# Patient Record
Sex: Female | Born: 1935 | Race: White | Hispanic: No | Marital: Married | Smoking: Never smoker
Health system: Southern US, Community
[De-identification: ages and names within clinical notes are randomized; demographics above are authoritative.]

## PROBLEM LIST (undated history)

## (undated) DIAGNOSIS — K589 Irritable bowel syndrome without diarrhea: Secondary | ICD-10-CM

## (undated) DIAGNOSIS — K222 Esophageal obstruction: Secondary | ICD-10-CM

## (undated) DIAGNOSIS — E538 Deficiency of other specified B group vitamins: Secondary | ICD-10-CM

## (undated) DIAGNOSIS — E079 Disorder of thyroid, unspecified: Secondary | ICD-10-CM

## (undated) DIAGNOSIS — R269 Unspecified abnormalities of gait and mobility: Principal | ICD-10-CM

## (undated) DIAGNOSIS — D509 Iron deficiency anemia, unspecified: Secondary | ICD-10-CM

## (undated) DIAGNOSIS — D649 Anemia, unspecified: Secondary | ICD-10-CM

## (undated) DIAGNOSIS — K219 Gastro-esophageal reflux disease without esophagitis: Secondary | ICD-10-CM

## (undated) DIAGNOSIS — N200 Calculus of kidney: Secondary | ICD-10-CM

## (undated) DIAGNOSIS — I10 Essential (primary) hypertension: Secondary | ICD-10-CM

## (undated) HISTORY — DX: Irritable bowel syndrome, unspecified: K58.9

## (undated) HISTORY — DX: Esophageal obstruction: K22.2

## (undated) HISTORY — DX: Unspecified abnormalities of gait and mobility: R26.9

## (undated) HISTORY — DX: Essential (primary) hypertension: I10

## (undated) HISTORY — PX: COLONOSCOPY: SHX174

## (undated) HISTORY — DX: Iron deficiency anemia, unspecified: D50.9

## (undated) HISTORY — DX: Calculus of kidney: N20.0

## (undated) HISTORY — DX: Gastro-esophageal reflux disease without esophagitis: K21.9

## (undated) HISTORY — DX: Disorder of thyroid, unspecified: E07.9

## (undated) HISTORY — PX: ESOPHAGEAL DILATION: SHX303

## (undated) HISTORY — DX: Deficiency of other specified B group vitamins: E53.8

## (undated) HISTORY — DX: Anemia, unspecified: D64.9

---
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
## 1969-02-03 HISTORY — PX: VAGOTOMY AND PYLOROPLASTY: SHX831

## 1973-02-03 HISTORY — PX: ABDOMINAL HYSTERECTOMY: SHX81

## 1976-02-04 HISTORY — PX: CHOLECYSTECTOMY: SHX55

## 1997-06-16 ENCOUNTER — Other Ambulatory Visit: Admission: RE | Admit: 1997-06-16 | Discharge: 1997-06-16 | Payer: Self-pay | Admitting: *Deleted

## 1997-07-28 ENCOUNTER — Inpatient Hospital Stay (HOSPITAL_COMMUNITY): Admission: RE | Admit: 1997-07-28 | Discharge: 1997-07-30 | Payer: Self-pay | Admitting: Internal Medicine

## 1997-12-13 ENCOUNTER — Encounter: Payer: Self-pay | Admitting: Gastroenterology

## 1997-12-13 ENCOUNTER — Ambulatory Visit (HOSPITAL_COMMUNITY): Admission: RE | Admit: 1997-12-13 | Discharge: 1997-12-13 | Payer: Self-pay | Admitting: Gastroenterology

## 1998-05-04 ENCOUNTER — Ambulatory Visit (HOSPITAL_COMMUNITY): Admission: RE | Admit: 1998-05-04 | Discharge: 1998-05-04 | Payer: Self-pay | Admitting: Gastroenterology

## 1998-05-04 ENCOUNTER — Encounter: Payer: Self-pay | Admitting: Gastroenterology

## 1998-05-08 ENCOUNTER — Encounter (HOSPITAL_COMMUNITY): Admission: RE | Admit: 1998-05-08 | Discharge: 1998-08-06 | Payer: Self-pay | Admitting: Internal Medicine

## 1998-05-24 ENCOUNTER — Other Ambulatory Visit: Admission: RE | Admit: 1998-05-24 | Discharge: 1998-05-24 | Payer: Self-pay | Admitting: Gastroenterology

## 1998-06-19 ENCOUNTER — Other Ambulatory Visit: Admission: RE | Admit: 1998-06-19 | Discharge: 1998-06-19 | Payer: Self-pay | Admitting: *Deleted

## 1998-12-31 ENCOUNTER — Encounter (HOSPITAL_COMMUNITY): Admission: RE | Admit: 1998-12-31 | Discharge: 1999-03-31 | Payer: Self-pay | Admitting: Internal Medicine

## 1999-03-12 ENCOUNTER — Other Ambulatory Visit: Admission: RE | Admit: 1999-03-12 | Discharge: 1999-03-12 | Payer: Self-pay | Admitting: *Deleted

## 1999-06-13 ENCOUNTER — Other Ambulatory Visit: Admission: RE | Admit: 1999-06-13 | Discharge: 1999-06-13 | Payer: Self-pay | Admitting: *Deleted

## 1999-12-06 ENCOUNTER — Encounter (INDEPENDENT_AMBULATORY_CARE_PROVIDER_SITE_OTHER): Payer: Self-pay | Admitting: Specialist

## 1999-12-06 ENCOUNTER — Other Ambulatory Visit: Admission: RE | Admit: 1999-12-06 | Discharge: 1999-12-06 | Payer: Self-pay | Admitting: Gastroenterology

## 2000-04-27 ENCOUNTER — Other Ambulatory Visit: Admission: RE | Admit: 2000-04-27 | Discharge: 2000-04-27 | Payer: Self-pay | Admitting: Gastroenterology

## 2000-04-27 ENCOUNTER — Encounter (INDEPENDENT_AMBULATORY_CARE_PROVIDER_SITE_OTHER): Payer: Self-pay

## 2000-04-28 ENCOUNTER — Emergency Department (HOSPITAL_COMMUNITY): Admission: EM | Admit: 2000-04-28 | Discharge: 2000-04-28 | Payer: Self-pay | Admitting: *Deleted

## 2000-04-29 ENCOUNTER — Encounter: Payer: Self-pay | Admitting: Gastroenterology

## 2000-04-29 ENCOUNTER — Inpatient Hospital Stay (HOSPITAL_COMMUNITY): Admission: EM | Admit: 2000-04-29 | Discharge: 2000-05-01 | Payer: Self-pay | Admitting: Gastroenterology

## 2000-05-12 ENCOUNTER — Encounter: Admission: RE | Admit: 2000-05-12 | Discharge: 2000-05-12 | Payer: Self-pay | Admitting: Oncology

## 2000-05-12 ENCOUNTER — Encounter (HOSPITAL_COMMUNITY): Admission: RE | Admit: 2000-05-12 | Discharge: 2000-06-11 | Payer: Self-pay | Admitting: Oncology

## 2000-06-17 ENCOUNTER — Encounter: Admission: RE | Admit: 2000-06-17 | Discharge: 2000-06-17 | Payer: Self-pay | Admitting: Oncology

## 2000-06-22 ENCOUNTER — Other Ambulatory Visit: Admission: RE | Admit: 2000-06-22 | Discharge: 2000-06-22 | Payer: Self-pay | Admitting: *Deleted

## 2000-09-09 ENCOUNTER — Encounter: Admission: RE | Admit: 2000-09-09 | Discharge: 2000-09-09 | Payer: Self-pay | Admitting: Oncology

## 2000-09-09 ENCOUNTER — Encounter (HOSPITAL_COMMUNITY): Admission: RE | Admit: 2000-09-09 | Discharge: 2000-10-09 | Payer: Self-pay | Admitting: Oncology

## 2000-10-09 ENCOUNTER — Encounter: Admission: RE | Admit: 2000-10-09 | Discharge: 2000-10-09 | Payer: Self-pay | Admitting: Oncology

## 2000-12-04 ENCOUNTER — Encounter: Admission: RE | Admit: 2000-12-04 | Discharge: 2000-12-04 | Payer: Self-pay | Admitting: Oncology

## 2000-12-04 ENCOUNTER — Encounter (HOSPITAL_COMMUNITY): Admission: RE | Admit: 2000-12-04 | Discharge: 2001-01-03 | Payer: Self-pay | Admitting: Oncology

## 2001-01-11 ENCOUNTER — Encounter: Admission: RE | Admit: 2001-01-11 | Discharge: 2001-01-11 | Payer: Self-pay | Admitting: Oncology

## 2001-01-11 ENCOUNTER — Encounter (HOSPITAL_COMMUNITY): Admission: RE | Admit: 2001-01-11 | Discharge: 2001-02-10 | Payer: Self-pay | Admitting: Oncology

## 2001-02-15 ENCOUNTER — Encounter (HOSPITAL_COMMUNITY): Admission: RE | Admit: 2001-02-15 | Discharge: 2001-03-17 | Payer: Self-pay | Admitting: Oncology

## 2001-02-15 ENCOUNTER — Encounter: Admission: RE | Admit: 2001-02-15 | Discharge: 2001-02-15 | Payer: Self-pay | Admitting: Oncology

## 2001-05-14 ENCOUNTER — Encounter (HOSPITAL_COMMUNITY): Admission: RE | Admit: 2001-05-14 | Discharge: 2001-06-13 | Payer: Self-pay | Admitting: Oncology

## 2001-05-14 ENCOUNTER — Encounter: Admission: RE | Admit: 2001-05-14 | Discharge: 2001-05-14 | Payer: Self-pay | Admitting: Oncology

## 2001-07-12 ENCOUNTER — Encounter: Admission: RE | Admit: 2001-07-12 | Discharge: 2001-07-12 | Payer: Self-pay | Admitting: Oncology

## 2001-07-12 ENCOUNTER — Encounter (HOSPITAL_COMMUNITY): Admission: RE | Admit: 2001-07-12 | Discharge: 2001-08-11 | Payer: Self-pay | Admitting: Oncology

## 2001-08-26 ENCOUNTER — Encounter (HOSPITAL_COMMUNITY): Admission: RE | Admit: 2001-08-26 | Discharge: 2001-09-25 | Payer: Self-pay | Admitting: Oncology

## 2001-08-26 ENCOUNTER — Encounter: Payer: Self-pay | Admitting: Internal Medicine

## 2001-08-26 ENCOUNTER — Encounter: Admission: RE | Admit: 2001-08-26 | Discharge: 2001-08-26 | Payer: Self-pay | Admitting: Oncology

## 2001-08-26 ENCOUNTER — Encounter: Admission: RE | Admit: 2001-08-26 | Discharge: 2001-08-26 | Payer: Self-pay | Admitting: Internal Medicine

## 2001-08-27 ENCOUNTER — Encounter: Payer: Self-pay | Admitting: Cardiology

## 2001-08-27 ENCOUNTER — Ambulatory Visit: Admission: RE | Admit: 2001-08-27 | Discharge: 2001-08-27 | Payer: Self-pay | Admitting: Internal Medicine

## 2001-10-11 ENCOUNTER — Encounter (HOSPITAL_COMMUNITY): Admission: RE | Admit: 2001-10-11 | Discharge: 2001-11-10 | Payer: Self-pay | Admitting: Oncology

## 2001-10-11 ENCOUNTER — Encounter: Admission: RE | Admit: 2001-10-11 | Discharge: 2001-10-11 | Payer: Self-pay | Admitting: Oncology

## 2001-11-22 ENCOUNTER — Encounter: Admission: RE | Admit: 2001-11-22 | Discharge: 2001-11-22 | Payer: Self-pay | Admitting: Oncology

## 2001-11-22 ENCOUNTER — Encounter (HOSPITAL_COMMUNITY): Admission: RE | Admit: 2001-11-22 | Discharge: 2001-12-22 | Payer: Self-pay | Admitting: Oncology

## 2001-12-01 ENCOUNTER — Ambulatory Visit (HOSPITAL_COMMUNITY): Admission: RE | Admit: 2001-12-01 | Discharge: 2001-12-01 | Payer: Self-pay | Admitting: Internal Medicine

## 2001-12-06 ENCOUNTER — Encounter: Payer: Self-pay | Admitting: Internal Medicine

## 2001-12-06 ENCOUNTER — Encounter: Admission: RE | Admit: 2001-12-06 | Discharge: 2001-12-06 | Payer: Self-pay | Admitting: Internal Medicine

## 2002-02-07 ENCOUNTER — Encounter: Payer: Self-pay | Admitting: Emergency Medicine

## 2002-02-07 ENCOUNTER — Emergency Department (HOSPITAL_COMMUNITY): Admission: EM | Admit: 2002-02-07 | Discharge: 2002-02-07 | Payer: Self-pay | Admitting: Emergency Medicine

## 2002-02-13 ENCOUNTER — Inpatient Hospital Stay (HOSPITAL_COMMUNITY): Admission: RE | Admit: 2002-02-13 | Discharge: 2002-02-16 | Payer: Self-pay | Admitting: Internal Medicine

## 2002-02-13 ENCOUNTER — Encounter: Payer: Self-pay | Admitting: Internal Medicine

## 2002-02-21 ENCOUNTER — Encounter: Admission: RE | Admit: 2002-02-21 | Discharge: 2002-02-21 | Payer: Self-pay | Admitting: Oncology

## 2002-02-21 ENCOUNTER — Encounter (HOSPITAL_COMMUNITY): Admission: RE | Admit: 2002-02-21 | Discharge: 2002-03-23 | Payer: Self-pay | Admitting: Oncology

## 2002-04-18 ENCOUNTER — Encounter: Admission: RE | Admit: 2002-04-18 | Discharge: 2002-04-18 | Payer: Self-pay | Admitting: Oncology

## 2002-04-18 ENCOUNTER — Encounter (HOSPITAL_COMMUNITY): Admission: RE | Admit: 2002-04-18 | Discharge: 2002-05-18 | Payer: Self-pay | Admitting: Oncology

## 2002-06-14 ENCOUNTER — Encounter: Admission: RE | Admit: 2002-06-14 | Discharge: 2002-06-14 | Payer: Self-pay | Admitting: Oncology

## 2002-06-14 ENCOUNTER — Encounter (HOSPITAL_COMMUNITY): Admission: RE | Admit: 2002-06-14 | Discharge: 2002-07-14 | Payer: Self-pay | Admitting: Oncology

## 2002-07-28 ENCOUNTER — Encounter: Admission: RE | Admit: 2002-07-28 | Discharge: 2002-07-28 | Payer: Self-pay | Admitting: Oncology

## 2002-07-28 ENCOUNTER — Encounter (HOSPITAL_COMMUNITY): Admission: RE | Admit: 2002-07-28 | Discharge: 2002-08-27 | Payer: Self-pay | Admitting: Oncology

## 2002-09-07 ENCOUNTER — Encounter: Admission: RE | Admit: 2002-09-07 | Discharge: 2002-09-07 | Payer: Self-pay | Admitting: Oncology

## 2002-10-13 ENCOUNTER — Encounter (HOSPITAL_COMMUNITY): Admission: RE | Admit: 2002-10-13 | Discharge: 2002-11-12 | Payer: Self-pay | Admitting: Oncology

## 2002-10-13 ENCOUNTER — Encounter: Admission: RE | Admit: 2002-10-13 | Discharge: 2002-10-13 | Payer: Self-pay | Admitting: Oncology

## 2002-11-17 ENCOUNTER — Encounter: Admission: RE | Admit: 2002-11-17 | Discharge: 2002-11-17 | Payer: Self-pay | Admitting: Oncology

## 2002-12-20 ENCOUNTER — Encounter: Admission: RE | Admit: 2002-12-20 | Discharge: 2002-12-20 | Payer: Self-pay | Admitting: Oncology

## 2002-12-20 ENCOUNTER — Encounter (HOSPITAL_COMMUNITY): Admission: RE | Admit: 2002-12-20 | Discharge: 2003-01-19 | Payer: Self-pay | Admitting: Oncology

## 2003-01-31 ENCOUNTER — Encounter (HOSPITAL_COMMUNITY): Admission: RE | Admit: 2003-01-31 | Discharge: 2003-03-02 | Payer: Self-pay | Admitting: Oncology

## 2003-01-31 ENCOUNTER — Encounter: Admission: RE | Admit: 2003-01-31 | Discharge: 2003-01-31 | Payer: Self-pay | Admitting: Oncology

## 2003-03-22 ENCOUNTER — Encounter: Admission: RE | Admit: 2003-03-22 | Discharge: 2003-03-22 | Payer: Self-pay | Admitting: Oncology

## 2003-04-24 ENCOUNTER — Encounter (HOSPITAL_COMMUNITY): Admission: RE | Admit: 2003-04-24 | Discharge: 2003-05-24 | Payer: Self-pay | Admitting: Oncology

## 2003-04-24 ENCOUNTER — Encounter: Admission: RE | Admit: 2003-04-24 | Discharge: 2003-04-24 | Payer: Self-pay | Admitting: Oncology

## 2003-06-26 ENCOUNTER — Encounter: Admission: RE | Admit: 2003-06-26 | Discharge: 2003-06-26 | Payer: Self-pay

## 2003-06-26 ENCOUNTER — Encounter (HOSPITAL_COMMUNITY): Admission: RE | Admit: 2003-06-26 | Discharge: 2003-07-26 | Payer: Self-pay | Admitting: Oncology

## 2003-07-31 ENCOUNTER — Encounter: Admission: RE | Admit: 2003-07-31 | Discharge: 2003-07-31 | Payer: Self-pay | Admitting: Internal Medicine

## 2003-08-02 ENCOUNTER — Encounter: Admission: RE | Admit: 2003-08-02 | Discharge: 2003-08-02 | Payer: Self-pay | Admitting: Internal Medicine

## 2003-08-14 ENCOUNTER — Encounter (HOSPITAL_COMMUNITY): Admission: RE | Admit: 2003-08-14 | Discharge: 2003-09-13 | Payer: Self-pay | Admitting: Oncology

## 2003-08-14 ENCOUNTER — Encounter: Admission: RE | Admit: 2003-08-14 | Discharge: 2003-08-14 | Payer: Self-pay | Admitting: Oncology

## 2003-08-23 ENCOUNTER — Encounter: Admission: RE | Admit: 2003-08-23 | Discharge: 2003-08-23 | Payer: Self-pay | Admitting: Internal Medicine

## 2003-09-20 ENCOUNTER — Encounter: Admission: RE | Admit: 2003-09-20 | Discharge: 2003-09-20 | Payer: Self-pay | Admitting: Internal Medicine

## 2003-10-16 ENCOUNTER — Encounter: Admission: RE | Admit: 2003-10-16 | Discharge: 2003-11-03 | Payer: Self-pay | Admitting: Oncology

## 2003-10-16 ENCOUNTER — Encounter (HOSPITAL_COMMUNITY): Admission: RE | Admit: 2003-10-16 | Discharge: 2003-11-03 | Payer: Self-pay | Admitting: Oncology

## 2003-11-27 ENCOUNTER — Encounter: Admission: RE | Admit: 2003-11-27 | Discharge: 2003-11-27 | Payer: Self-pay | Admitting: Internal Medicine

## 2004-01-08 ENCOUNTER — Encounter (HOSPITAL_COMMUNITY): Admission: RE | Admit: 2004-01-08 | Discharge: 2004-02-02 | Payer: Self-pay | Admitting: Oncology

## 2004-01-08 ENCOUNTER — Ambulatory Visit (HOSPITAL_COMMUNITY): Payer: Self-pay | Admitting: Oncology

## 2004-01-08 ENCOUNTER — Encounter: Admission: RE | Admit: 2004-01-08 | Discharge: 2004-02-02 | Payer: Self-pay | Admitting: Oncology

## 2004-05-13 ENCOUNTER — Encounter: Admission: RE | Admit: 2004-05-13 | Discharge: 2004-05-13 | Payer: Self-pay | Admitting: Oncology

## 2004-05-13 ENCOUNTER — Ambulatory Visit (HOSPITAL_COMMUNITY): Payer: Self-pay | Admitting: Oncology

## 2004-05-13 ENCOUNTER — Encounter (HOSPITAL_COMMUNITY): Admission: RE | Admit: 2004-05-13 | Discharge: 2004-06-12 | Payer: Self-pay | Admitting: Oncology

## 2004-07-29 ENCOUNTER — Encounter (HOSPITAL_COMMUNITY): Admission: RE | Admit: 2004-07-29 | Discharge: 2004-08-28 | Payer: Self-pay | Admitting: Oncology

## 2004-07-29 ENCOUNTER — Encounter: Admission: RE | Admit: 2004-07-29 | Discharge: 2004-07-29 | Payer: Self-pay | Admitting: Oncology

## 2004-07-31 ENCOUNTER — Ambulatory Visit (HOSPITAL_COMMUNITY): Payer: Self-pay | Admitting: Oncology

## 2004-10-21 ENCOUNTER — Ambulatory Visit (HOSPITAL_COMMUNITY): Payer: Self-pay | Admitting: Oncology

## 2004-10-21 ENCOUNTER — Encounter (HOSPITAL_COMMUNITY): Admission: RE | Admit: 2004-10-21 | Discharge: 2004-11-01 | Payer: Self-pay | Admitting: Oncology

## 2004-10-21 ENCOUNTER — Encounter: Admission: RE | Admit: 2004-10-21 | Discharge: 2004-11-01 | Payer: Self-pay | Admitting: Oncology

## 2005-01-15 ENCOUNTER — Ambulatory Visit (HOSPITAL_COMMUNITY): Payer: Self-pay | Admitting: Oncology

## 2005-01-15 ENCOUNTER — Encounter: Admission: RE | Admit: 2005-01-15 | Discharge: 2005-01-15 | Payer: Self-pay | Admitting: Oncology

## 2005-04-11 ENCOUNTER — Encounter: Admission: RE | Admit: 2005-04-11 | Discharge: 2005-04-11 | Payer: Self-pay | Admitting: Oncology

## 2005-04-11 ENCOUNTER — Encounter (HOSPITAL_COMMUNITY): Admission: RE | Admit: 2005-04-11 | Discharge: 2005-05-11 | Payer: Self-pay | Admitting: Oncology

## 2005-04-11 ENCOUNTER — Ambulatory Visit (HOSPITAL_COMMUNITY): Payer: Self-pay | Admitting: Oncology

## 2005-07-03 ENCOUNTER — Ambulatory Visit (HOSPITAL_COMMUNITY): Payer: Self-pay | Admitting: Oncology

## 2005-07-03 ENCOUNTER — Encounter: Admission: RE | Admit: 2005-07-03 | Discharge: 2005-07-03 | Payer: Self-pay | Admitting: Oncology

## 2005-07-03 ENCOUNTER — Encounter (HOSPITAL_COMMUNITY): Admission: RE | Admit: 2005-07-03 | Discharge: 2005-08-02 | Payer: Self-pay | Admitting: Oncology

## 2005-09-25 ENCOUNTER — Encounter (HOSPITAL_COMMUNITY): Admission: RE | Admit: 2005-09-25 | Discharge: 2005-10-25 | Payer: Self-pay | Admitting: Oncology

## 2005-09-25 ENCOUNTER — Encounter: Admission: RE | Admit: 2005-09-25 | Discharge: 2005-09-25 | Payer: Self-pay | Admitting: Oncology

## 2005-09-29 ENCOUNTER — Ambulatory Visit (HOSPITAL_COMMUNITY): Payer: Self-pay | Admitting: Oncology

## 2005-12-29 ENCOUNTER — Ambulatory Visit (HOSPITAL_COMMUNITY): Payer: Self-pay | Admitting: Oncology

## 2006-03-26 ENCOUNTER — Ambulatory Visit (HOSPITAL_COMMUNITY): Payer: Self-pay | Admitting: Oncology

## 2006-03-26 ENCOUNTER — Encounter (HOSPITAL_COMMUNITY): Admission: RE | Admit: 2006-03-26 | Discharge: 2006-04-25 | Payer: Self-pay | Admitting: Oncology

## 2006-06-18 ENCOUNTER — Ambulatory Visit (HOSPITAL_COMMUNITY): Payer: Self-pay | Admitting: Oncology

## 2006-06-18 ENCOUNTER — Encounter (HOSPITAL_COMMUNITY): Admission: RE | Admit: 2006-06-18 | Discharge: 2006-07-18 | Payer: Self-pay | Admitting: Oncology

## 2006-09-17 ENCOUNTER — Ambulatory Visit (HOSPITAL_COMMUNITY): Payer: Self-pay | Admitting: Oncology

## 2006-09-17 ENCOUNTER — Encounter (HOSPITAL_COMMUNITY): Admission: RE | Admit: 2006-09-17 | Discharge: 2006-10-17 | Payer: Self-pay | Admitting: Oncology

## 2006-11-04 ENCOUNTER — Encounter: Admission: RE | Admit: 2006-11-04 | Discharge: 2006-11-04 | Payer: Self-pay | Admitting: Internal Medicine

## 2006-12-10 ENCOUNTER — Ambulatory Visit (HOSPITAL_COMMUNITY): Payer: Self-pay | Admitting: Oncology

## 2006-12-10 ENCOUNTER — Encounter (HOSPITAL_COMMUNITY): Admission: RE | Admit: 2006-12-10 | Discharge: 2007-01-09 | Payer: Self-pay | Admitting: Oncology

## 2006-12-10 ENCOUNTER — Encounter (HOSPITAL_COMMUNITY): Payer: Self-pay | Admitting: Oncology

## 2007-01-21 ENCOUNTER — Ambulatory Visit: Payer: Self-pay | Admitting: Gastroenterology

## 2007-01-25 ENCOUNTER — Ambulatory Visit: Payer: Self-pay | Admitting: Gastroenterology

## 2007-01-25 ENCOUNTER — Encounter: Payer: Self-pay | Admitting: Gastroenterology

## 2007-03-22 ENCOUNTER — Ambulatory Visit (HOSPITAL_COMMUNITY): Payer: Self-pay | Admitting: Oncology

## 2007-03-22 ENCOUNTER — Encounter (HOSPITAL_COMMUNITY): Admission: RE | Admit: 2007-03-22 | Discharge: 2007-04-21 | Payer: Self-pay | Admitting: Oncology

## 2007-06-08 ENCOUNTER — Ambulatory Visit (HOSPITAL_COMMUNITY): Payer: Self-pay | Admitting: Oncology

## 2007-06-08 ENCOUNTER — Encounter (HOSPITAL_COMMUNITY): Admission: RE | Admit: 2007-06-08 | Discharge: 2007-07-08 | Payer: Self-pay | Admitting: Oncology

## 2007-10-27 ENCOUNTER — Other Ambulatory Visit (HOSPITAL_COMMUNITY): Payer: Self-pay | Admitting: Oncology

## 2007-10-27 ENCOUNTER — Ambulatory Visit (HOSPITAL_COMMUNITY): Payer: Self-pay | Admitting: Oncology

## 2007-10-27 ENCOUNTER — Encounter (HOSPITAL_COMMUNITY): Admission: RE | Admit: 2007-10-27 | Discharge: 2007-11-01 | Payer: Self-pay | Admitting: Oncology

## 2007-11-22 ENCOUNTER — Encounter: Payer: Self-pay | Admitting: Gastroenterology

## 2008-01-13 ENCOUNTER — Encounter (HOSPITAL_COMMUNITY): Admission: RE | Admit: 2008-01-13 | Discharge: 2008-02-01 | Payer: Self-pay | Admitting: Oncology

## 2008-01-13 ENCOUNTER — Ambulatory Visit (HOSPITAL_COMMUNITY): Payer: Self-pay | Admitting: Oncology

## 2008-01-21 ENCOUNTER — Encounter: Payer: Self-pay | Admitting: Gastroenterology

## 2008-03-14 ENCOUNTER — Ambulatory Visit (HOSPITAL_COMMUNITY): Payer: Self-pay | Admitting: Oncology

## 2008-04-18 ENCOUNTER — Encounter (HOSPITAL_COMMUNITY): Admission: RE | Admit: 2008-04-18 | Discharge: 2008-05-18 | Payer: Self-pay | Admitting: Oncology

## 2008-08-02 ENCOUNTER — Encounter (HOSPITAL_COMMUNITY): Admission: RE | Admit: 2008-08-02 | Discharge: 2008-09-01 | Payer: Self-pay | Admitting: Oncology

## 2008-08-02 ENCOUNTER — Encounter: Payer: Self-pay | Admitting: Gastroenterology

## 2008-08-02 ENCOUNTER — Ambulatory Visit (HOSPITAL_COMMUNITY): Payer: Self-pay | Admitting: Oncology

## 2008-09-27 ENCOUNTER — Other Ambulatory Visit (HOSPITAL_COMMUNITY): Payer: Self-pay | Admitting: Oncology

## 2008-09-27 ENCOUNTER — Encounter (HOSPITAL_COMMUNITY): Admission: RE | Admit: 2008-09-27 | Discharge: 2008-10-27 | Payer: Self-pay | Admitting: Oncology

## 2008-09-27 ENCOUNTER — Ambulatory Visit (HOSPITAL_COMMUNITY): Payer: Self-pay | Admitting: Oncology

## 2008-12-06 ENCOUNTER — Encounter (HOSPITAL_COMMUNITY): Admission: RE | Admit: 2008-12-06 | Discharge: 2009-01-05 | Payer: Self-pay | Admitting: Oncology

## 2008-12-06 ENCOUNTER — Ambulatory Visit (HOSPITAL_COMMUNITY): Payer: Self-pay | Admitting: Oncology

## 2009-03-14 ENCOUNTER — Encounter (HOSPITAL_COMMUNITY): Admission: RE | Admit: 2009-03-14 | Discharge: 2009-04-13 | Payer: Self-pay | Admitting: Oncology

## 2009-03-14 ENCOUNTER — Ambulatory Visit (HOSPITAL_COMMUNITY): Payer: Self-pay | Admitting: Oncology

## 2009-03-14 ENCOUNTER — Encounter: Payer: Self-pay | Admitting: Gastroenterology

## 2009-04-02 ENCOUNTER — Encounter: Admission: RE | Admit: 2009-04-02 | Discharge: 2009-04-02 | Payer: Self-pay | Admitting: Internal Medicine

## 2009-05-14 ENCOUNTER — Ambulatory Visit (HOSPITAL_COMMUNITY): Payer: Self-pay | Admitting: Oncology

## 2009-06-18 ENCOUNTER — Encounter (HOSPITAL_COMMUNITY): Admission: RE | Admit: 2009-06-18 | Discharge: 2009-07-18 | Payer: Self-pay | Admitting: Oncology

## 2009-09-10 ENCOUNTER — Ambulatory Visit (HOSPITAL_COMMUNITY): Payer: Self-pay | Admitting: Oncology

## 2009-09-10 ENCOUNTER — Encounter (HOSPITAL_COMMUNITY): Admission: RE | Admit: 2009-09-10 | Discharge: 2009-10-10 | Payer: Self-pay | Admitting: Oncology

## 2009-09-10 ENCOUNTER — Encounter: Payer: Self-pay | Admitting: Gastroenterology

## 2009-11-23 ENCOUNTER — Other Ambulatory Visit (HOSPITAL_COMMUNITY): Payer: Self-pay | Admitting: Oncology

## 2009-11-23 ENCOUNTER — Encounter (HOSPITAL_COMMUNITY)
Admission: RE | Admit: 2009-11-23 | Discharge: 2009-12-23 | Payer: Self-pay | Source: Home / Self Care | Admitting: Oncology

## 2009-12-03 ENCOUNTER — Encounter: Payer: Self-pay | Admitting: Gastroenterology

## 2010-03-05 NOTE — Letter (Signed)
Summary: [facility]  [facility]   Imported By: Lester [place] 09/28/2009 10:35:46  _____________________________________________________________________  External Attachment:    Type:   Image     Comment:   External Document

## 2010-03-05 NOTE — Letter (Signed)
Summary: [facility]  [facility]   Imported By: Sherian Rein 12/21/2009 14:43:21  _____________________________________________________________________  External Attachment:    Type:   Image     Comment:   External Document

## 2010-03-05 NOTE — Letter (Signed)
Summary: [facility]  [facility]   Imported By: Lennie Odor 04/12/2009 15:27:39  _____________________________________________________________________  External Attachment:    Type:   Image     Comment:   External Document

## 2010-04-05 ENCOUNTER — Encounter (HOSPITAL_COMMUNITY): Payer: Medicare Other | Attending: Oncology

## 2010-04-05 ENCOUNTER — Other Ambulatory Visit (HOSPITAL_COMMUNITY): Payer: Self-pay | Admitting: Oncology

## 2010-04-05 DIAGNOSIS — D509 Iron deficiency anemia, unspecified: Secondary | ICD-10-CM

## 2010-04-05 DIAGNOSIS — E538 Deficiency of other specified B group vitamins: Secondary | ICD-10-CM | POA: Insufficient documentation

## 2010-04-05 LAB — CBC
HCT: 38.7 % (ref 36.0–46.0)
Hemoglobin: 13 g/dL (ref 12.0–15.0)
MCH: 33.2 pg (ref 26.0–34.0)
MCV: 98.7 fL (ref 78.0–100.0)
Platelets: 262 10*3/uL (ref 150–400)

## 2010-04-05 LAB — DIFFERENTIAL
Basophils Absolute: 0.1 10*3/uL (ref 0.0–0.1)
Basophils Relative: 1 % (ref 0–1)
Lymphs Abs: 1.3 10*3/uL (ref 0.7–4.0)
Monocytes Absolute: 0.4 10*3/uL (ref 0.1–1.0)

## 2010-04-05 LAB — FERRITIN: Ferritin: 286 ng/mL (ref 10–291)

## 2010-04-12 ENCOUNTER — Encounter (HOSPITAL_COMMUNITY): Payer: Medicare Other

## 2010-04-12 DIAGNOSIS — D509 Iron deficiency anemia, unspecified: Secondary | ICD-10-CM

## 2010-04-17 LAB — CBC
HCT: 35.5 % — ABNORMAL LOW (ref 36.0–46.0)
MCH: 33.9 pg (ref 26.0–34.0)
MCV: 98.8 fL (ref 78.0–100.0)
Platelets: 239 10*3/uL (ref 150–400)
RBC: 3.59 MIL/uL — ABNORMAL LOW (ref 3.87–5.11)
WBC: 7.2 10*3/uL (ref 4.0–10.5)

## 2010-04-17 LAB — FERRITIN: Ferritin: 86 ng/mL (ref 10–291)

## 2010-04-19 LAB — CBC
Hemoglobin: 12.4 g/dL (ref 12.0–15.0)
MCH: 34 pg (ref 26.0–34.0)
MCHC: 34 g/dL (ref 30.0–36.0)
Platelets: 243 10*3/uL (ref 150–400)

## 2010-04-22 LAB — CBC
HCT: 36.5 % (ref 36.0–46.0)
Hemoglobin: 13.1 g/dL (ref 12.0–15.0)
MCV: 97.1 fL (ref 78.0–100.0)
RBC: 3.75 MIL/uL — ABNORMAL LOW (ref 3.87–5.11)

## 2010-04-24 LAB — CBC
HCT: 37.4 % (ref 36.0–46.0)
Hemoglobin: 12.8 g/dL (ref 12.0–15.0)
MCHC: 34.2 g/dL (ref 30.0–36.0)
MCV: 98 fL (ref 78.0–100.0)
Platelets: 261 10*3/uL (ref 150–400)
RDW: 12.6 % (ref 11.5–15.5)

## 2010-04-24 LAB — FERRITIN: Ferritin: 41 ng/mL (ref 10–291)

## 2010-05-08 LAB — CBC
HCT: 38.9 % (ref 36.0–46.0)
MCHC: 34.4 g/dL (ref 30.0–36.0)
MCV: 98.9 fL (ref 78.0–100.0)
Platelets: 218 10*3/uL (ref 150–400)
RBC: 3.93 MIL/uL (ref 3.87–5.11)
RDW: 12.8 % (ref 11.5–15.5)

## 2010-05-11 LAB — CBC
MCHC: 35.4 g/dL (ref 30.0–36.0)
RBC: 3.97 MIL/uL (ref 3.87–5.11)
WBC: 6.3 10*3/uL (ref 4.0–10.5)

## 2010-05-13 LAB — CBC
HCT: 37.6 % (ref 36.0–46.0)
Hemoglobin: 13.3 g/dL (ref 12.0–15.0)
MCHC: 35.3 g/dL (ref 30.0–36.0)
MCV: 98.6 fL (ref 78.0–100.0)
Platelets: 205 10*3/uL (ref 150–400)

## 2010-05-16 LAB — CBC
HCT: 38.2 % (ref 36.0–46.0)
MCV: 98.2 fL (ref 78.0–100.0)
RBC: 3.89 MIL/uL (ref 3.87–5.11)
RDW: 13.8 % (ref 11.5–15.5)
WBC: 6.5 10*3/uL (ref 4.0–10.5)

## 2010-06-18 NOTE — Assessment & Plan Note (Signed)
[facility]                         GASTROENTEROLOGY OFFICE NOTE   NAME:[name], [name] GASIOR                    MRN:          [id number]  DATE:01/21/2007                            DOB:          [birth year]    [name] [name] is a 77 year old white female referred today for evaluation  of intermittent solid food dysphagia. She has a chronic history of acid  reflux and is on Nexium 40 mg a day.  She occasionally has some  substernal burning but most described is progressive solid food  dysphagia over the last several months.  She has a long history of  recurrent esophageal strictures and has had multiple esophageal  dilatations, the last performed in September of '04.  She is status post partial gastrectomy and vagotomy because of  intractable peptic ulcer disease.   I have followed her for years with Dr's. [name] [name] and [name] [name] because  of severe persistent iron-deficiency anemia with intermittent Guaiac  positive stools.  She has recently been controlled with iron infusions  three times a week every 12 weeks, per Dr. Mariel [name].  She does have a  history of sensitivity reaction to that in the past to iron but  apparently with premedications, tolerated these acceptably.  She also  has a history of chronic B12 deficiency related to her gastric surgery,  dumping syndrome, and she has had previous appendectomy and  cholecystectomy additionally.  She has had thorough GI work up including  endoscopy, colonoscopy, small bowel series, and small bowel biopsies,  all of which have been normal.  She does have positive antigliadin  antibodies but has had no evidence of celiac disease and has a negative  anti-endomysial antibody.  The patient has refused intracapsular camera  exam of her small bowel for various reasons.  Last colonoscopy exam was  in March of 2002 and was unremarkable except for diverticulosis and some  small hyperplastic polyps.  The patient is post  menopausal and is having  no menstrual bleeding.   In the past she has been tested for bacterial overgrowth syndrome and  this has been negative but I am not sure of the validity of her breath  test because she had severe diarrhea with a glucose load and has known  dumping syndrome.  She does complain of some abdominal gas, bloating,  but she does not have watery diarrhea and is having 2-3 soft, non-bloody  bowel movements a day.  She specifically denies melena or hematochezia.  As part of her peptic ulcer disease workup she has had examinations for  H. pylori and these have been negative.  She denies the use of NSAIDs,  cigarettes, or alcohol.  She currently is following a regular diet and  says her appetite is good and her weight has been stable.   PAST MEDICAL HISTORY:  Besides the above mentioned problems the patient  does have a history of chronic thyroid dysfunction and is on Synthroid  replacement therapy.  She has had previous hysterectomy many years ago. She has a history of  cataract surgery, and has had multiple blood transfusions in the past  but no history  of chronic liver disease or hepatitis.   MEDICATIONS:  Nexium 40 mg daily, Premarin 0.625 mg daily, L-thyroxin  100 mcg daily, p.r.n. Clonazepam 0.5 mg, a variety of eye drops,  Citracal & vitamin D three a day, additional vitamin D several times a  day, and as mentioned above, iron infusions.   ALLERGIES:  SHE IN THE PAST HAS HAD NAUSEA AND VOMITING WITH CODEINE AND  HAS HAD REACTIONS TO CIPRO.   FAMILY HISTORY:  Remarkable for multiple types of cancer but no colon  cancer.  Her brother had prostate cancer. She has aunts and sisters with  both ovarian and breast cancer.   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 She has a  high school education. She does not smoke, uses ethanol socially but  never had problems with ethanol abuse or dependency.   REVIEW OF SYSTEMS:  Noncontributory.  She denies  hypovolemia symptoms  such as palpitations, shortness of breath, dyspnea on exertion, etc.  She gives no known history of cardiovascular or pulmonary complaints at  this time. Review of systems otherwise noncontributory.   LABORATORY DATA:  Is all followed and checked by Dr. Mariel [name] on a  regular basis and recent blood work has apparently been normal, and  blood work and notes from Dr. Mariel [name] are last dictated June of 2008.  Apparently the patient does have an unexplained eosinophilia.  Her  ferritin's run around 75 which is a good level.   EXAMINATION:  She is a healthy appearing white female in no acute  distress. Appears her stated age.  I cannot appreciate stigmata of  chronic liver disease.  VITAL SIGNS:  Height is 5 feet, 3 inches and weighs 108 pounds.  Blood  pressure 122/74 and pulse was 72 and regular.  CHEST:  Clear.  HEART:  She appeared to be in a regular rhythm without murmur, gallop or  rubs.  ABDOMEN:  I could not appreciate hepatosplenomegaly, abdominal masses or  tenderness. She did have very active bowel sounds and had mild abdominal  distention.  RECTAL:  I did not perform a rectal exam.  EXTREMITIES:  Unremarkable.  NEURO:  Mental status was clear.   ASSESSMENT:  1. This is a patient with a past history of esophageal strictures      which responded well to esophageal dilatations. Status post partial      gastrectomy and vagotomy with associated dumping syndrome.  2. Chronic B12 deficiency.  Probable chronic iron malabsorption.  3. Consider chronic bacterial overgrowth syndrome.  4. Chronic iron deficiency anemia requiring iron infusions.  5. History of osteoporosis on calcium and vitamin D.  6. Chronic thyroid dysfunction on Synthroid.  7. Status post hysterectomy.  8. History of diverticulosis coli.   RECOMMENDATIONS:  1. Continue reflux regimen and proton pump inhibitor therapy.  2. Outpatient endoscopy with esophageal dilatation.  3. Obtain small  bowel biopsy at time of endoscopic exam.  4. Consider treatment with Xi faxen 400 mg t.i.d. This is a      nonabsorbable antibiotic which is excellent treatment for bacterial      overgrowth syndrome.  In the interim I have placed her on Florastor      (saccharomyces) therapy.  5. Continue other medications per Dr's. [name] [name] and [name] [name].     Vania Rea. Jarold Motto, MD, Caleen Essex, FAGA  Electronically Signed    DRP/MedQ  DD: 01/21/2007  DT: 01/21/2007  Job #: [id number]   cc:   Antony Madura,  M.D.  Ladona Horns. [name] Sleet, MD

## 2010-06-21 NOTE — Discharge Summary (Signed)
   NAME:  [name] [name], [name] [name] NO.:  [id number]   MEDICAL RECORD NO.:  [id number]                   PATIENT TYPE:  INP   LOCATION:  0468                                 FACILITY:  [facility]   PHYSICIAN:  Antony Madura, M.D.             DATE OF BIRTH:  08-21-1931   DATE OF ADMISSION:  02/13/2002  DATE OF DISCHARGE:  02/16/2002                                 DISCHARGE SUMMARY   FINAL DIAGNOSES:  1. Gastroenteritis this admission with dehydration.  2. History of iron deficiency anemia.  3. History of multiple surgeries.   HISTORY OF ILLNESS:  This 77 year old white female was admitted because of  nausea and abdominal pain.  The admission was precipitated by a prolonged  illness consisting of these symptoms with associated dehydration in the  office.  The patient was treated conservatively on multiple occasions and  even at one point had an ER visit where the flat plate of the abdomen showed  an ileus.  Unfortunately, the patient failed to improve over 3 weeks and  admission was arranged.   HOSPITAL COURSE:  The patient was admitted to the medical unit and given IV  fluids.  The patient was seen through the courtesy of Dr. Alysia Penna of  GI who recommended a CAT scan of the abdomen.  Unfortunately the patient did  refuse this, but fortunately she improved slowly over a 3-day hospital  course with IV fluids.  At the time of the dictation, the patient was nausea  free and abdominal pain free.  The remainder of the hospital course was  unremarkable.  Discharge planning was arranged.   LABORATORY DATA:  Urinalysis normal.  Potassium 3.9 after mild potassium  replacement, sed rate 25.  WBC 4.7, hemoglobin 9.5, hematocrit 28.  Electrolytes normal except for potassium, 3.3 on admission.  Amylase normal.  Flat plate no acute disease with resolution of prior illness.  Urinalysis  normal.   DISCHARGE MEDICATIONS:  1. Nexium 40 mg daily.  2. Synthroid 0.05  daily.  3. Premarin 0.65 mg daily.  4. Klonopin 0.5 up to b.i.d. p.r.n.  5. Robinul Forte p.r.n. for abdominal cramps.   DIET:  Conservative full liquid diet for the first 3 or 4 days, then with  liquids being as needed at diet of choice.   FOLLOWUP APPOINTMENT:  As needed.   CONDITION ON DISCHARGE:  Improved.                                               Antony Madura, M.D.    RWR/MEDQ  D:  02/16/2002  T:  02/16/2002  Job:  [id number]

## 2010-06-21 NOTE — H&P (Signed)
[facility]  Patient:    [name] [name], [name] [name]                    MRN: [id number] Adm. Date:  [id number] Attending:  Starr Sinclair Dictator:   Mike Gip, P.A.-C. CC:         Vania Rea. Jarold Motto, M.D. [facility]  Antony Madura, M.D.   History and Physical  CHIEF COMPLAINT:  Rectal bleeding, urgency, abdominal cramping, nausea and vomiting.  HISTORY:  The patient is a pleasant 77 year old white female with history of hypothyroidism, appendectomy, hysterectomy, cholecystectomy, remote vagotomy and pyloroplasty in 1971 secondary to peptic ulcer disease.  Patient with history of recurrent anemia for years, felt to be secondary to GI losses, but source is unclear.  The patient has required multiple transfusions in the past but none recently.  Patient underwent colonoscopy, March 25th, with Dr. Vania Rea. Patterson for further evaluation to rule out colonic source of blood loss.  She was found to have left colon diverticulosis and a tiny transverse colon polyp which was hot biopsied.  The patient called yesterday with complaints of rectal bleeding, which was bright red blood in small volumes, less than one teaspoon per episode, but with urgency for bowel movements.  She was evaluated in the ER, hemodynamically stable.  CBC showed a WBC of 12.2, hemoglobin 13.1, hematocrit of 38.5, which was stable for her.  She had a witnessed small mucusy bowel movement in the emergency room.  Patient was discharged to home to rest, on full liquids, and asked to call today if the bleeding continued.  They called the office this morning stating that she had continued with small frequent urgent bowel movements through the night with small amounts of bright red blood.  She had no fever, questioned whether she was having some chills, however.  She has also become nauseated and had one episode of vomiting this morning and is now complaining of lower abdominal cramping which  is intermittent.  The patient is admitted at this time for further diagnostic evaluation, bowel rest and antiemetics.  At this time, we will rule out post-polypectomy burn with bleed, rule out possible diverticulitis, rule out ischemic or infectious colitis, though feel less likely.  MEDICATIONS: 1. Synthroid 0.12 p.o. q.d. 2. Prilosec 20 mg p.o. q.d. 3. Premarin 0.625 days 1 through 25. 4. Provera -- she is uncertain of her dosage -- days 16 through 25. 5. B12 daily.  ALLERGIES:  CODEINE, which causes nausea and vomiting, and CIPRO, which causes agitation and nausea.  PAST HISTORY: 1. Appendectomy, hysterectomy, cholecystectomy and vagotomy, all surgeries    done in the 1970s. 2. Hypothyroidism. 3. Chronic recurrent anemia.  FAMILY HISTORY:  Pertinent for father with coronary disease, mother still alive with coronary disease, one sister and two aunts with breast cancer. There is no family history of GI disease.  SOCIAL HISTORY:  Patient is married.  She is a Futures trader.  No tobacco and no ETOH.  REVIEW OF SYSTEMS:  CARDIOVASCULAR:  Negative for anginal symptoms. PULMONARY:  Negative for cough, shortness of breath or sputum production. GENITOURINARY:  Denies any dysuria, urgency or frequency.  MUSCULOSKELETAL: Denies any arthritic symptoms or extremity discomfort.  PHYSICAL EXAMINATION:  GENERAL:  Well-developed, thin white female in no acute distress.  VITAL SIGNS:  Temperature is 97.4, blood pressure 145/62, pulse is 74, respirations 16.  HEENT:  Non-traumatic, normocephalic.  EOMI.  PERLA.  Sclerae anicteric.  CARDIOVASCULAR:  Regular rate and rhythm with  S1 and S2.  No murmur, rub or gallop.  PULMONARY:  Clear to A&P.  ABDOMEN:  Soft.  Bowel sounds are active.  She is tender in the left lower quadrant and suprapubic region.  No guarding or rebound.  No mass or hepatosplenomegaly.  RECTAL:  No stool in the vault but mucus is heme-positive.  EXTREMITIES:   Without clubbing, cyanosis, or edema.  IMPRESSION: 1. [age]-year-old white female with rectal bleeding, urgency, abdominal    cramping and now nausea and vomiting, 48 hours post colonoscopy with    polypectomy, rule out post-polypectomy burn with secondary bleeding, rule    out diverticulitis, rule out possible infectious or ischemic colitis. 2. Diverticulosis. 3. History of chronic recurrent anemia, ? gastrointestinal source. 4. Status post remote vagotomy for peptic ulcer disease. 5. Status post appendectomy, cholecystectomy and hysterectomy. 6. Hypothyroidism. 7. Gastroesophageal reflux disease.  PLAN:  Patient is admitted to the service of Dr. Judie Petit T. Pleas Koch., who is covering the hospital.  She will be placed on IV fluid hydration, bowel rest. I will check STAT labs and abdominal films.  If abdominal films are unremarkable, we will proceed with CT of the abdomen and pelvis.  She will be covered with IV Unasyn.  For further details, please see the orders. DD:  04/29/00 TD:  04/30/00 Job: [id number]

## 2010-07-17 ENCOUNTER — Encounter (HOSPITAL_COMMUNITY): Payer: Self-pay | Admitting: *Deleted

## 2010-08-10 ENCOUNTER — Other Ambulatory Visit (HOSPITAL_COMMUNITY): Payer: Self-pay | Admitting: Oncology

## 2010-08-10 ENCOUNTER — Encounter (HOSPITAL_COMMUNITY): Payer: Self-pay | Admitting: Oncology

## 2010-08-10 DIAGNOSIS — D509 Iron deficiency anemia, unspecified: Secondary | ICD-10-CM

## 2010-08-10 DIAGNOSIS — E538 Deficiency of other specified B group vitamins: Secondary | ICD-10-CM | POA: Insufficient documentation

## 2010-08-10 HISTORY — DX: Deficiency of other specified B group vitamins: E53.8

## 2010-08-10 HISTORY — DX: Iron deficiency anemia, unspecified: D50.9

## 2010-08-15 ENCOUNTER — Encounter (HOSPITAL_BASED_OUTPATIENT_CLINIC_OR_DEPARTMENT_OTHER): Payer: Medicare Other

## 2010-08-15 ENCOUNTER — Encounter (HOSPITAL_COMMUNITY): Payer: Medicare Other | Attending: Oncology

## 2010-08-15 ENCOUNTER — Other Ambulatory Visit (HOSPITAL_COMMUNITY): Payer: Self-pay | Admitting: Oncology

## 2010-08-15 ENCOUNTER — Encounter (HOSPITAL_COMMUNITY): Payer: Medicare Other

## 2010-08-15 VITALS — BP 163/77 | HR 68 | Temp 98.1°F

## 2010-08-15 DIAGNOSIS — D509 Iron deficiency anemia, unspecified: Secondary | ICD-10-CM | POA: Insufficient documentation

## 2010-08-15 LAB — CBC
Hemoglobin: 13.3 g/dL (ref 12.0–15.0)
MCHC: 33.5 g/dL (ref 30.0–36.0)
RBC: 4.02 MIL/uL (ref 3.87–5.11)
WBC: 7 10*3/uL (ref 4.0–10.5)

## 2010-08-15 LAB — FERRITIN: Ferritin: 546 ng/mL — ABNORMAL HIGH (ref 10–291)

## 2010-08-15 MED ORDER — SODIUM CHLORIDE 0.9 % IJ SOLN: 10.0000 mL | INTRAMUSCULAR | Status: DC | PRN

## 2010-08-15 MED ORDER — ALTEPLASE 2 MG IJ SOLR: 2.0000 mg | Freq: Once | INTRAMUSCULAR | Status: DC | PRN

## 2010-08-15 MED ORDER — SODIUM CHLORIDE 0.9 % IJ SOLN: 3.0000 mL | INTRAMUSCULAR | Status: DC | PRN

## 2010-08-15 MED ORDER — SODIUM CHLORIDE 0.9 % IV SOLN
Freq: Once | INTRAVENOUS | Status: AC
  Administered 2010-08-15: 12:00:00 via INTRAVENOUS

## 2010-08-15 MED ORDER — HEPARIN SOD (PORK) LOCK FLUSH 100 UNIT/ML IV SOLN: 250.0000 [IU] | Freq: Once | INTRAVENOUS | Status: DC | PRN

## 2010-08-15 MED ORDER — HEPARIN SOD (PORK) LOCK FLUSH 100 UNIT/ML IV SOLN: 500.0000 [IU] | Freq: Once | INTRAVENOUS | Status: DC | PRN

## 2010-08-15 MED ORDER — FERUMOXYTOL INJECTION 510 MG/17 ML
510.0000 mg | Freq: Once | INTRAVENOUS | Status: AC
  Administered 2010-08-15: 510 mg via INTRAVENOUS
  Filled 2010-08-15: qty 17

## 2010-08-16 ENCOUNTER — Other Ambulatory Visit (HOSPITAL_COMMUNITY): Payer: Self-pay | Admitting: Oncology

## 2010-08-19 ENCOUNTER — Other Ambulatory Visit (HOSPITAL_COMMUNITY): Payer: Self-pay | Admitting: *Deleted

## 2010-08-19 DIAGNOSIS — D509 Iron deficiency anemia, unspecified: Secondary | ICD-10-CM

## 2010-08-22 ENCOUNTER — Ambulatory Visit (HOSPITAL_COMMUNITY): Payer: Medicare Other

## 2010-10-21 ENCOUNTER — Encounter (HOSPITAL_COMMUNITY): Payer: Medicare Other | Attending: Oncology

## 2010-10-21 DIAGNOSIS — D509 Iron deficiency anemia, unspecified: Secondary | ICD-10-CM | POA: Insufficient documentation

## 2010-10-21 LAB — CBC
MCV: 99.5 fL (ref 78.0–100.0)
Platelets: 263 10*3/uL (ref 150–400)
RBC: 4.18 MIL/uL (ref 3.87–5.11)
RDW: 12.8 % (ref 11.5–15.5)
WBC: 8.2 10*3/uL (ref 4.0–10.5)

## 2010-10-21 LAB — FERRITIN: Ferritin: 525 ng/mL — ABNORMAL HIGH (ref 10–291)

## 2010-10-21 NOTE — Progress Notes (Signed)
Labs drawn today for cbc,ferr 

## 2010-10-25 LAB — DIFFERENTIAL
Basophils Absolute: 0.1
Basophils Relative: 1
Eosinophils Relative: 26 — ABNORMAL HIGH
Lymphocytes Relative: 20

## 2010-10-25 LAB — CBC
HCT: 40.6
MCHC: 34.9
Platelets: 251
RDW: 13.5

## 2010-10-25 LAB — FERRITIN: Ferritin: 235 (ref 10–291)

## 2010-10-30 LAB — FERRITIN: Ferritin: 238 (ref 10–291)

## 2010-10-30 LAB — CBC
MCHC: 35.2
Platelets: 260
RBC: 4.01
RDW: 12.6

## 2010-11-04 LAB — CBC
HCT: 39.8
Platelets: 239
RDW: 13.4
WBC: 6.9

## 2010-11-04 LAB — FERRITIN: Ferritin: 122 (ref 10–291)

## 2010-11-07 LAB — METHYLMALONIC ACID, SERUM: Methylmalonic Acid, Quantitative: 140 nmol/L (ref 87–318)

## 2010-11-07 LAB — CBC
Platelets: 246 10*3/uL (ref 150–400)
RBC: 4.16 MIL/uL (ref 3.87–5.11)
WBC: 6.2 10*3/uL (ref 4.0–10.5)

## 2010-11-07 LAB — FERRITIN: Ferritin: 60 ng/mL (ref 10–291)

## 2010-11-07 LAB — VITAMIN B12: Vitamin B-12: 323 pg/mL (ref 211–911)

## 2010-11-11 LAB — VITAMIN B12: Vitamin B-12: 355 (ref 211–911)

## 2010-11-11 LAB — METHYLMALONIC ACID, SERUM: Methylmalonic Acid, Quantitative: 134 nmol/L (ref 87–318)

## 2010-11-12 LAB — DIFFERENTIAL
Basophils Absolute: 0.1
Eosinophils Relative: 22 — ABNORMAL HIGH
Lymphocytes Relative: 20
Lymphs Abs: 1.1
Neutrophils Relative %: 51

## 2010-11-12 LAB — FERRITIN: Ferritin: 203 (ref 10–291)

## 2010-11-12 LAB — CBC
Platelets: 258
RDW: 13.1
WBC: 5.7

## 2010-11-18 LAB — CBC
HCT: 37.3
Hemoglobin: 12.7
Platelets: 259
RDW: 13.3
WBC: 7

## 2010-12-02 ENCOUNTER — Encounter (HOSPITAL_COMMUNITY): Payer: Self-pay | Admitting: Oncology

## 2010-12-02 ENCOUNTER — Encounter (HOSPITAL_COMMUNITY): Payer: Medicare Other | Attending: Oncology | Admitting: Oncology

## 2010-12-02 VITALS — BP 155/91 | HR 79 | Temp 97.4°F | Wt 103.2 lb

## 2010-12-02 DIAGNOSIS — K254 Chronic or unspecified gastric ulcer with hemorrhage: Secondary | ICD-10-CM

## 2010-12-02 DIAGNOSIS — D509 Iron deficiency anemia, unspecified: Secondary | ICD-10-CM

## 2010-12-02 DIAGNOSIS — M899 Disorder of bone, unspecified: Secondary | ICD-10-CM

## 2010-12-02 DIAGNOSIS — D500 Iron deficiency anemia secondary to blood loss (chronic): Secondary | ICD-10-CM

## 2010-12-02 LAB — CBC
Hemoglobin: 14.1 g/dL (ref 12.0–15.0)
MCH: 33 pg (ref 26.0–34.0)
MCV: 99.3 fL (ref 78.0–100.0)
Platelets: 259 10*3/uL (ref 150–400)
RBC: 4.27 MIL/uL (ref 3.87–5.11)
WBC: 6.3 10*3/uL (ref 4.0–10.5)

## 2010-12-02 LAB — FERRITIN: Ferritin: 473 ng/mL — ABNORMAL HIGH (ref 10–291)

## 2010-12-02 NOTE — Patient Instructions (Signed)
[facility]  Discharge Instructions  RECOMMENDATIONS MADE BY THE CONSULTANT AND ANY TEST RESULTS WILL BE SENT TO YOUR REFERRING DOCTOR.   EXAM FINDINGS BY MD TODAY AND SIGNS AND SYMPTOMS TO REPORT TO CLINIC OR PRIMARY MD: You are doing well.  Report any shortness of breath, increased ice intake, increased fatigue, etc.   MEDICATIONS PRESCRIBED: none    SPECIAL INSTRUCTIONS/FOLLOW-UP: Lab work Needed today and every 3 months and Return to Clinic on in 6 months.   I acknowledge that I have been informed and understand all the instructions given to me and received a copy. I do not have any more questions at this time, but understand that I may call the Specialty Clinic at [facility] at [phone] during business hours should I have any further questions or need assistance in obtaining follow-up care.    __________________________________________  _____________  __________ Signature of Patient or Authorized Representative            Date                   Time    __________________________________________ Nurse's Signature

## 2010-12-02 NOTE — Progress Notes (Signed)
CC:   Antony Madura, M.D. Vania Rea. Jarold Motto, MD, Clementeen Graham, FACP, FAGA  DIAGNOSES: 1. Chronic iron deficiency with slow gastrointestinal leakage,     requiring intravenous iron times many years.  She has been very     stable now for the last 6 months and has received Feraheme     basically in early to mid July, 1 dose only.  Laboratories from     today are pending. 2. B12 deficiency identified by Dr. Sheryn Bison, on B12     replacement. 3. History of gastric ulcer disease, status post partial gastrectomy,     vagotomy, and pyloroplasty in 1971 by Dr. Francina Ames for severe GI     bleeding, receding 9 to 11 units of blood at that time. 4. Eosinophilia in the past.  Not truly an issue that I could     identify. 5. Esophageal stricture with multiple dilatations in the past by Dr.     Sheryn Bison. 6. History of a dumping syndrome. 7. Upper GI and small-bowel follow-through in March 2002 without     evidence for other GI blood loss etiologies. 8. Osteoporosis on therapy per Dr. Su Hilt. Durga's labs have been fabulous.  Her ferritin in March was 286.  It rose to 546 in July, 525 in September, and that was only the one dose of Feraheme that we gave her on July 12th, so she has been very stable and I do not want to get her overloaded with iron.  So she only had one dose in July as I mentioned.  We will get a CBC and ferritin today and in 12 weeks.  I will tentatively see her in 3 months, but she states she feels fabulous and has a negative review of systems otherwise.  Her vital signs are really very stable.  So we will see her then.    ______________________________ Ladona Horns. Mariel Sleet, MD ESN/MEDQ  D:  12/02/2010  T:  12/02/2010  Job:  [id number]

## 2010-12-02 NOTE — Progress Notes (Signed)
This office note has been dictated.

## 2011-02-24 ENCOUNTER — Encounter (HOSPITAL_COMMUNITY): Payer: Medicare Other | Attending: Oncology

## 2011-02-24 ENCOUNTER — Encounter (HOSPITAL_COMMUNITY): Payer: Medicare Other

## 2011-02-24 DIAGNOSIS — K254 Chronic or unspecified gastric ulcer with hemorrhage: Secondary | ICD-10-CM

## 2011-02-24 DIAGNOSIS — D509 Iron deficiency anemia, unspecified: Secondary | ICD-10-CM | POA: Insufficient documentation

## 2011-02-24 DIAGNOSIS — D500 Iron deficiency anemia secondary to blood loss (chronic): Secondary | ICD-10-CM

## 2011-02-24 LAB — CBC
HCT: 40.3 % (ref 36.0–46.0)
Hemoglobin: 13.4 g/dL (ref 12.0–15.0)
MCHC: 33.3 g/dL (ref 30.0–36.0)
RBC: 4.13 MIL/uL (ref 3.87–5.11)

## 2011-02-24 LAB — FERRITIN: Ferritin: 405 ng/mL — ABNORMAL HIGH (ref 10–291)

## 2011-02-24 NOTE — Progress Notes (Signed)
Labs drawn today for cbc,ferr 

## 2011-05-19 ENCOUNTER — Encounter (HOSPITAL_COMMUNITY): Payer: Medicare Other | Attending: Oncology

## 2011-05-19 ENCOUNTER — Encounter (HOSPITAL_COMMUNITY): Payer: Self-pay | Admitting: Oncology

## 2011-05-19 ENCOUNTER — Encounter (HOSPITAL_BASED_OUTPATIENT_CLINIC_OR_DEPARTMENT_OTHER): Payer: Medicare Other | Admitting: Oncology

## 2011-05-19 VITALS — BP 162/82 | HR 76 | Temp 97.8°F | Ht 61.5 in | Wt 106.0 lb

## 2011-05-19 DIAGNOSIS — D509 Iron deficiency anemia, unspecified: Secondary | ICD-10-CM

## 2011-05-19 DIAGNOSIS — D500 Iron deficiency anemia secondary to blood loss (chronic): Secondary | ICD-10-CM

## 2011-05-19 DIAGNOSIS — E538 Deficiency of other specified B group vitamins: Secondary | ICD-10-CM

## 2011-05-19 DIAGNOSIS — M810 Age-related osteoporosis without current pathological fracture: Secondary | ICD-10-CM

## 2011-05-19 LAB — CBC
HCT: 42.4 % (ref 36.0–46.0)
Hemoglobin: 13.6 g/dL (ref 12.0–15.0)
MCV: 98.1 fL (ref 78.0–100.0)
RBC: 4.32 MIL/uL (ref 3.87–5.11)
WBC: 7.7 10*3/uL (ref 4.0–10.5)

## 2011-05-19 NOTE — Patient Instructions (Signed)
[facility]  Discharge Instructions  RECOMMENDATIONS MADE BY THE CONSULTANT AND ANY TEST RESULTS WILL BE SENT TO YOUR REFERRING DOCTOR.   EXAM FINDINGS BY MD TODAY AND SIGNS AND SYMPTOMS TO REPORT TO CLINIC OR PRIMARY MD: Exam good     SPECIAL INSTRUCTIONS/FOLLOW-UP: Labs in 4.5 months then to see MD   I acknowledge that I have been informed and understand all the instructions given to me and received a copy. I do not have any more questions at this time, but understand that I may call the Specialty Clinic at [facility] at [phone] during business hours should I have any further questions or need assistance in obtaining follow-up care.    __________________________________________  _____________  __________ Signature of Patient or Authorized Representative            Date                   Time    __________________________________________ Nurse's Signature

## 2011-05-19 NOTE — Progress Notes (Signed)
Lab draw

## 2011-05-19 NOTE — Progress Notes (Signed)
Problem #1 chronic iron deficiency with slow GI blood loss requiring IV iron times many years now her last iron infusion was in 08/15/2010. She received feraheme 510 mg day one and day 8 at that time  Problem #2 B12 deficiency on monthly B12 replacement identified by Dr. Sheryn Bison years ago.  Problem #3 gastric ulcer disease status post partial gastrectomy and vagotomy and pyloroplasty 1971 by Dr. Francina Ames for severe GI bleeding requiring 9-11 units of blood at that time.  Esophageal stricture in the past with multiple dilation  Problem #5 osteoporosis on therapy per Dr. Burtis Junes is feeling great she has no complaints today review of systems. Her labs from today show a normal CBC but her ferritin is pending. It has been very stable for quite some time now dropping only a few points since her last infusion in July 2012. We will therefore call her once we get the results and see her back in 4 once.

## 2011-05-20 ENCOUNTER — Other Ambulatory Visit (HOSPITAL_COMMUNITY): Payer: Self-pay

## 2011-05-20 DIAGNOSIS — D509 Iron deficiency anemia, unspecified: Secondary | ICD-10-CM

## 2011-08-11 ENCOUNTER — Encounter (HOSPITAL_COMMUNITY): Payer: Medicare Other | Attending: Oncology

## 2011-08-11 DIAGNOSIS — D509 Iron deficiency anemia, unspecified: Secondary | ICD-10-CM | POA: Insufficient documentation

## 2011-08-11 LAB — CBC
HCT: 40 % (ref 36.0–46.0)
Hemoglobin: 13.2 g/dL (ref 12.0–15.0)
MCH: 32 pg (ref 26.0–34.0)
MCV: 96.9 fL (ref 78.0–100.0)
Platelets: 249 10*3/uL (ref 150–400)
RBC: 4.13 MIL/uL (ref 3.87–5.11)
WBC: 7.6 10*3/uL (ref 4.0–10.5)

## 2011-08-11 NOTE — Progress Notes (Signed)
Labs drawn today for cbc,ferr 

## 2011-08-12 ENCOUNTER — Other Ambulatory Visit (HOSPITAL_COMMUNITY): Payer: Self-pay

## 2011-08-12 DIAGNOSIS — D509 Iron deficiency anemia, unspecified: Secondary | ICD-10-CM

## 2011-09-22 ENCOUNTER — Other Ambulatory Visit (HOSPITAL_COMMUNITY): Payer: Medicare Other

## 2011-09-24 ENCOUNTER — Ambulatory Visit (HOSPITAL_COMMUNITY): Payer: Medicare Other | Admitting: Oncology

## 2011-10-11 ENCOUNTER — Emergency Department (HOSPITAL_COMMUNITY): Payer: Medicare Other

## 2011-10-11 ENCOUNTER — Emergency Department (HOSPITAL_COMMUNITY)
Admission: EM | Admit: 2011-10-11 | Discharge: 2011-10-12 | Disposition: A | Discharge status: Home / Self Care | Payer: Medicare Other | Attending: Emergency Medicine | Admitting: Emergency Medicine

## 2011-10-11 ENCOUNTER — Encounter (HOSPITAL_COMMUNITY): Payer: Self-pay | Admitting: Emergency Medicine

## 2011-10-11 DIAGNOSIS — K5289 Other specified noninfective gastroenteritis and colitis: Secondary | ICD-10-CM | POA: Insufficient documentation

## 2011-10-11 DIAGNOSIS — R10819 Abdominal tenderness, unspecified site: Secondary | ICD-10-CM | POA: Insufficient documentation

## 2011-10-11 DIAGNOSIS — I10 Essential (primary) hypertension: Secondary | ICD-10-CM | POA: Insufficient documentation

## 2011-10-11 DIAGNOSIS — E039 Hypothyroidism, unspecified: Secondary | ICD-10-CM | POA: Insufficient documentation

## 2011-10-11 DIAGNOSIS — Z79899 Other long term (current) drug therapy: Secondary | ICD-10-CM | POA: Insufficient documentation

## 2011-10-11 DIAGNOSIS — K529 Noninfective gastroenteritis and colitis, unspecified: Secondary | ICD-10-CM

## 2011-10-11 DIAGNOSIS — R109 Unspecified abdominal pain: Secondary | ICD-10-CM | POA: Insufficient documentation

## 2011-10-11 LAB — COMPREHENSIVE METABOLIC PANEL
ALT: 12 U/L (ref 0–35)
AST: 19 U/L (ref 0–37)
Albumin: 3.1 g/dL — ABNORMAL LOW (ref 3.5–5.2)
Alkaline Phosphatase: 99 U/L (ref 39–117)
BUN: 12 mg/dL (ref 6–23)
Potassium: 3.2 mEq/L — ABNORMAL LOW (ref 3.5–5.1)
Sodium: 130 mEq/L — ABNORMAL LOW (ref 135–145)
Total Protein: 6.4 g/dL (ref 6.0–8.3)

## 2011-10-11 LAB — CBC WITH DIFFERENTIAL/PLATELET
Basophils Absolute: 0 10*3/uL (ref 0.0–0.1)
Basophils Relative: 1 % (ref 0–1)
Eosinophils Absolute: 0.1 10*3/uL (ref 0.0–0.7)
MCH: 32 pg (ref 26.0–34.0)
MCHC: 34.4 g/dL (ref 30.0–36.0)
Neutrophils Relative %: 68 % (ref 43–77)
Platelets: 257 10*3/uL (ref 150–400)
RDW: 12.5 % (ref 11.5–15.5)

## 2011-10-11 LAB — URINALYSIS, ROUTINE W REFLEX MICROSCOPIC
Glucose, UA: NEGATIVE mg/dL
Hgb urine dipstick: NEGATIVE
Ketones, ur: 15 mg/dL — AB
Leukocytes, UA: NEGATIVE
pH: 6.5 (ref 5.0–8.0)

## 2011-10-11 MED ORDER — METRONIDAZOLE 500 MG PO TABS: 250.0000 mg | ORAL_TABLET | Freq: Three times a day (TID) | ORAL | Status: AC

## 2011-10-11 MED ORDER — CIPROFLOXACIN HCL 250 MG PO TABS: 250.0000 mg | ORAL_TABLET | Freq: Two times a day (BID) | ORAL | Status: AC

## 2011-10-11 MED ORDER — SODIUM CHLORIDE 0.9 % IV BOLUS (SEPSIS)
1000.0000 mL | Freq: Once | INTRAVENOUS | Status: AC
  Administered 2011-10-11: 1000 mL via INTRAVENOUS

## 2011-10-11 MED ORDER — POTASSIUM CHLORIDE CRYS ER 20 MEQ PO TBCR
40.0000 meq | EXTENDED_RELEASE_TABLET | Freq: Once | ORAL | Status: AC
  Administered 2011-10-11: 40 meq via ORAL
  Filled 2011-10-11: qty 2

## 2011-10-11 MED ORDER — IOHEXOL 300 MG/ML  SOLN
100.0000 mL | Freq: Once | INTRAMUSCULAR | Status: AC | PRN
  Administered 2011-10-11: 100 mL via INTRAVENOUS

## 2011-10-11 MED ORDER — METRONIDAZOLE 500 MG PO TABS
250.0000 mg | ORAL_TABLET | Freq: Once | ORAL | Status: AC
  Administered 2011-10-11: 250 mg via ORAL
  Filled 2011-10-11: qty 1

## 2011-10-11 MED ORDER — ONDANSETRON 8 MG PO TBDP: 8.0000 mg | ORAL_TABLET | Freq: Three times a day (TID) | ORAL | Status: AC | PRN

## 2011-10-11 NOTE — ED Notes (Signed)
Bedside report received from previous RN 

## 2011-10-11 NOTE — ED Notes (Signed)
Pt. Is unable to use the restroom at this time, but she is aware that we need the urine.

## 2011-10-11 NOTE — ED Notes (Signed)
Pt attempted to use the bathroom but was unable to provide a urine sample

## 2011-10-11 NOTE — ED Notes (Signed)
OZH:[id number]<HQ> Expected date:10/11/11<BR> Expected time:<BR> Means of arrival:<BR> Comments:<BR> EMS: generalized weakness

## 2011-10-11 NOTE — ED Provider Notes (Signed)
History    CSN: [id number] Arrival date & time 10/11/11  1800  First MD Initiated Contact with Patient 10/11/11 1844    Chief Complaint  Patient presents with  . Diarrhea      HPI The patient presents to the emergency room with complaints of diarrhea and nausea since Wednesday. She has had approximately 5 loose stools daily. She has felt nauseated but has not actually vomited. She has also had trouble with some abdominal cramping in the lower abdomen. Her symptoms persisted and she called her doctor today who recommended she come to the emergency department she's not sure she's had any fevers. She denies any chest pain or shortness of breath. She has not noted any urinary symptoms. Before she came to the emergency room today, she took an Imodium tablet.  She has not been on antibiotics recently and she has not traveled recently.  Past Medical History  Diagnosis Date  . Thyroid disease     hypothyroid  . Cataract     bilateral  . Anemia     iron def  . Iron deficiency anemia 08/10/2010  . B12 deficiency 08/10/2010  . Hypertension     Past Surgical History  Procedure Date  . Vagotomy 1971  . Abdominal hysterectomy 1975  . Appendectomy 1955  . Cholecystectomy 1978  . Esophageal dilation 2001  . Esophageal dilation 2004  . Vagotomy     Family History  Problem Relation Age of Onset  . Cancer Sister   . Hypertension Brother     History  Substance Use Topics  . Smoking status: Never Smoker   . Smokeless tobacco: Never Used  . Alcohol Use: 4.2 oz/week    7 Glasses of wine per week    OB History    Grav Para Term Preterm Abortions TAB SAB Ect Mult Living                  Review of Systems  All other systems reviewed and are negative.    Allergies  Fergon; Ampicillin; Cephalexin; Codeine; Famotidine; Nizatidine; Tagamet; and Salmon  Home Medications   Current Outpatient Rx  Name Route Sig Dispense Refill  . ACETAMINOPHEN 325 MG PO TABS Oral Take 650 mg by mouth  every 6 (six) hours as needed. For aches/pains    . VITAMIN C 500 MG PO CAPS Oral Take 500 mg by mouth 4 (four) times daily.      [name] CALCITONIN (SALMON) 200 UNIT/ACT NA SOLN Nasal Place 1 spray into the nose daily.      [name] CALCIUM 500 + D PO Oral Take by mouth. 3 tablets daily    . DIPHENHYDRAMINE HCL 25 MG PO CAPS Oral Take 25 mg by mouth at bedtime as needed. For allergies    . ESOMEPRAZOLE MAGNESIUM 40 MG PO CPDR Oral Take 40 mg by mouth daily before breakfast.    . LEVOTHYROXINE SODIUM 100 MCG PO TABS Oral Take 100 mcg by mouth every morning.     [name] LOPERAMIDE HCL 2 MG PO CAPS Oral Take 4 mg by mouth 4 (four) times daily as needed.    [name] LOSARTAN POTASSIUM 25 MG PO TABS Oral Take 25 mg by mouth daily. Takes 1/2 tablet daily     . PRESCRIPTION MEDICATION Oral Take 1 tablet by mouth daily. Estrogen medication; not premarin; prescribed by Zenaida Deed    . VITAMIN B-12 1000 MCG PO TABS Oral Take 1,000 mcg by mouth daily.  BP 163/61  Pulse 79  Temp 98.1 F (36.7 C) (Oral)  Resp 18  SpO2 97%  Physical Exam  Nursing note and vitals reviewed. Constitutional: She appears well-developed and well-nourished. No distress.  HENT:  Head: Normocephalic and atraumatic.  Right Ear: External ear normal.  Left Ear: External ear normal.  Eyes: Conjunctivae are normal. Right eye exhibits no discharge. Left eye exhibits no discharge. No scleral icterus.  Neck: Neck supple. No tracheal deviation present.  Cardiovascular: Normal rate, regular rhythm and intact distal pulses.   Pulmonary/Chest: Effort normal and breath sounds normal. No stridor. No respiratory distress. She has no wheezes. She has no rales.  Abdominal: Soft. Bowel sounds are normal. She exhibits no distension, no ascites, no pulsatile midline mass and no mass. There is Tenderness: mild suprapubic region and llq.. There is no rebound and no guarding. No hernia.  Musculoskeletal: She exhibits no edema and no tenderness.  Neurological: She  is alert. She has normal strength. No sensory deficit. Cranial nerve deficit:  no gross defecits noted. She exhibits normal muscle tone. She displays no seizure activity. Coordination normal.  Skin: Skin is warm and dry. No rash noted.  Psychiatric: She has a normal mood and affect.    ED Course  Procedures (including critical care time)  Labs Reviewed  CBC WITH DIFFERENTIAL - Abnormal; Notable for the following:    Monocytes Relative 16 (*)     Monocytes Absolute 1.3 (*)     All other components within normal limits  COMPREHENSIVE METABOLIC PANEL - Abnormal; Notable for the following:    Sodium 130 (*)     Potassium 3.2 (*)     Chloride 94 (*)     Glucose, Bld 105 (*)     Albumin 3.1 (*)     GFR calc non Af Amer 85 (*)     All other components within normal limits  URINALYSIS, ROUTINE W REFLEX MICROSCOPIC - Abnormal; Notable for the following:    Ketones, ur 15 (*)     All other components within normal limits   Ct Abdomen Pelvis W Contrast  10/11/2011  *RADIOLOGY REPORT*  Clinical Data: Lower abdominal and pelvic pain.  Diarrhea.  CT ABDOMEN AND PELVIS WITH CONTRAST  Technique:  Multidetector CT imaging of the abdomen and pelvis was performed following the standard protocol during bolus administration of intravenous contrast.  Contrast: OMNIPAQUE IOHEXOL 300 MG/ML  SOLN  Comparison: None.  Findings: Surgical clips are seen from prior cholecystectomy.  Mild biliary ductal dilatation is seen, which may be related to prior cholecystectomy.  No evidence of pancreatic mass or pancreatic ductal dilatation.  No liver masses are identified.  The spleen, adrenal glands, and kidneys are normal in appearance. No evidence of hydronephrosis.  No soft tissue masses or lymphadenopathy identified within the abdomen or pelvis.  Mild diffuse colonic wall thickening and mucosal enhancement is seen, consistent with diffuse colitis.  No evidence of bowel obstruction.  Prior hysterectomy noted.  No  evidence of abscess or free fluid.  Lumbar spine degenerative change and scoliosis noted.  IMPRESSION:  1.  Mild diffuse colitis.  No evidence of abscess or free fluid. 2.  Mild diffuse biliary ductal dilatation, which may be related to prior cholecystectomy.  No etiology apparent by CT.  Recommend correlation with liver function tests.   Original Report Authenticated By: Danae Orleans, M.D.      1. Colitis       MDM  Pt is feeling better.  She has only had one loose stool while she has been here.  She has not had any vomiting and has tolerated PO.  She feels comfortable going home.  Will dc home on Cipro and flagyl for probable infectious colitis.  Instructed to her to drink fluids with electrolytes and to return for worsening symptoms        Celene Kras, MD 10/11/11 [phone]

## 2011-10-11 NOTE — ED Notes (Signed)
Pt alert and oriented x4. Respirations even and unlabored, bilateral symmetrical rise and fall of chest. Skin warm and dry. In no acute distress. Denies needs.   

## 2011-10-11 NOTE — ED Notes (Signed)
Pt presents w/ abdominal pain, cramping w/ diarrhea and emesis since Wednesday.

## 2011-10-20 ENCOUNTER — Encounter (HOSPITAL_COMMUNITY): Payer: Medicare Other | Attending: Oncology

## 2011-10-20 DIAGNOSIS — D509 Iron deficiency anemia, unspecified: Secondary | ICD-10-CM | POA: Insufficient documentation

## 2011-10-20 LAB — CBC
HCT: 38.6 % (ref 36.0–46.0)
Hemoglobin: 13 g/dL (ref 12.0–15.0)
MCHC: 33.7 g/dL (ref 30.0–36.0)

## 2011-10-20 NOTE — Progress Notes (Signed)
Labs drawn today for cbc,ferr 

## 2011-11-19 ENCOUNTER — Encounter (HOSPITAL_COMMUNITY): Payer: Medicare Other | Attending: Oncology | Admitting: Oncology

## 2011-11-19 ENCOUNTER — Other Ambulatory Visit (HOSPITAL_COMMUNITY): Payer: Self-pay | Admitting: *Deleted

## 2011-11-19 ENCOUNTER — Encounter (HOSPITAL_BASED_OUTPATIENT_CLINIC_OR_DEPARTMENT_OTHER): Payer: Medicare Other

## 2011-11-19 VITALS — BP 131/68 | HR 73 | Temp 97.7°F | Resp 18 | Wt 102.0 lb

## 2011-11-19 DIAGNOSIS — D500 Iron deficiency anemia secondary to blood loss (chronic): Secondary | ICD-10-CM

## 2011-11-19 DIAGNOSIS — D509 Iron deficiency anemia, unspecified: Secondary | ICD-10-CM | POA: Insufficient documentation

## 2011-11-19 DIAGNOSIS — K922 Gastrointestinal hemorrhage, unspecified: Secondary | ICD-10-CM

## 2011-11-19 LAB — CBC
HCT: 38.4 % (ref 36.0–46.0)
MCV: 97 fL (ref 78.0–100.0)
RBC: 3.96 MIL/uL (ref 3.87–5.11)
WBC: 9.6 10*3/uL (ref 4.0–10.5)

## 2011-11-19 NOTE — Progress Notes (Signed)
Labs drawn today for cbc,ferr 

## 2011-11-19 NOTE — Patient Instructions (Addendum)
[facility]  Discharge Instructions  RECOMMENDATIONS MADE BY THE CONSULTANT AND ANY TEST RESULTS WILL BE SENT TO YOUR REFERRING DOCTOR.   EXAM FINDINGS BY MD TODAY AND SIGNS AND SYMPTOMS TO REPORT TO CLINIC OR PRIMARY MD: Doing well.  INSTRUCTIONS GIVEN AND DISCUSSED: Labs in 4 months  SPECIAL INSTRUCTIONS/FOLLOW-UP: See Dr. Mariel Sleet in 8 months   I acknowledge that I have been informed and understand all the instructions given to me and received a copy. I do not have any more questions at this time, but understand that I may call the [facility] at [facility] at [phone] during business hours should I have any further questions or need assistance in obtaining follow-up care.    __________________________________________  _____________  __________ Signature of Patient or Authorized Representative            Date                   Time    __________________________________________ Nurse's Signature

## 2011-11-19 NOTE — Progress Notes (Signed)
Diagnosis #1 chronic iron deficiency with slow GI blood losses requiring IV iron times many years but her last infusion was on 08/15/2010. She received feraheme 510 mg day one at that time. She tolerated very well. In the past we have sometimes given her therapy on day 1  and day 8. Since his last infusion she has done very well. Her ferritin to have drifted down ever so slowly this time which is excellent. Hemoglobin remains normal today. She feels well. Vital signs are stable.  We await her ferritin level and then we'll make some decisions as to when she will come back for labs but I suspect it will be in 4 months. I will tentatively see her in 8 months myself.

## 2012-01-19 ENCOUNTER — Encounter (HOSPITAL_COMMUNITY): Payer: Medicare Other | Attending: Oncology

## 2012-01-19 DIAGNOSIS — D509 Iron deficiency anemia, unspecified: Secondary | ICD-10-CM | POA: Insufficient documentation

## 2012-01-19 LAB — DIFFERENTIAL
Basophils Absolute: 0 10*3/uL (ref 0.0–0.1)
Basophils Relative: 0 % (ref 0–1)
Eosinophils Absolute: 2.4 10*3/uL — ABNORMAL HIGH (ref 0.0–0.7)
Monocytes Absolute: 0.4 10*3/uL (ref 0.1–1.0)
Monocytes Relative: 5 % (ref 3–12)

## 2012-01-19 LAB — CBC
Hemoglobin: 13.9 g/dL (ref 12.0–15.0)
MCH: 32.3 pg (ref 26.0–34.0)
MCV: 97.2 fL (ref 78.0–100.0)
RBC: 4.31 MIL/uL (ref 3.87–5.11)

## 2012-01-19 NOTE — Progress Notes (Signed)
Labs drawn today for cbc/diff,ferr 

## 2012-01-23 ENCOUNTER — Encounter (HOSPITAL_BASED_OUTPATIENT_CLINIC_OR_DEPARTMENT_OTHER): Payer: Medicare Other

## 2012-01-23 VITALS — BP 176/74 | HR 72 | Temp 97.5°F

## 2012-01-23 DIAGNOSIS — D509 Iron deficiency anemia, unspecified: Secondary | ICD-10-CM

## 2012-01-23 MED ORDER — SODIUM CHLORIDE 0.9 % IJ SOLN
INTRAMUSCULAR | Status: AC
  Filled 2012-01-23: qty 10

## 2012-01-23 MED ORDER — SODIUM CHLORIDE 0.9 % IJ SOLN
10.0000 mL | INTRAMUSCULAR | Status: DC | PRN
  Administered 2012-01-23: 10 mL
  Filled 2012-01-23: qty 10

## 2012-01-23 MED ORDER — FERUMOXYTOL INJECTION 510 MG/17 ML
510.0000 mg | Freq: Once | INTRAVENOUS | Status: AC
  Administered 2012-01-23: 510 mg via INTRAVENOUS
  Filled 2012-01-23: qty 17

## 2012-01-23 MED ORDER — SODIUM CHLORIDE 0.9 % IJ SOLN
INTRAMUSCULAR | Status: AC
  Filled 2012-01-23: qty 20

## 2012-01-23 MED ORDER — SODIUM CHLORIDE 0.9 % IV SOLN: Freq: Once | INTRAVENOUS | Status: DC

## 2012-01-23 NOTE — Progress Notes (Signed)
Tolerated fereheme well. 

## 2012-01-23 NOTE — Addendum Note (Signed)
Addended by: Laural Benes C on: 01/23/2012 10:19 AM   Modules accepted: Orders

## 2012-03-17 ENCOUNTER — Other Ambulatory Visit (HOSPITAL_COMMUNITY): Payer: Medicare Other

## 2012-04-02 ENCOUNTER — Encounter (HOSPITAL_COMMUNITY): Payer: Medicare Other | Attending: Oncology

## 2012-04-02 ENCOUNTER — Telehealth (HOSPITAL_COMMUNITY): Payer: Self-pay

## 2012-04-02 ENCOUNTER — Other Ambulatory Visit (HOSPITAL_COMMUNITY): Payer: Self-pay | Admitting: Oncology

## 2012-04-02 DIAGNOSIS — D509 Iron deficiency anemia, unspecified: Secondary | ICD-10-CM | POA: Insufficient documentation

## 2012-04-02 LAB — CBC
Hemoglobin: 13.4 g/dL (ref 12.0–15.0)
MCH: 32.2 pg (ref 26.0–34.0)
Platelets: 341 10*3/uL (ref 150–400)
RBC: 4.16 MIL/uL (ref 3.87–5.11)
WBC: 9 10*3/uL (ref 4.0–10.5)

## 2012-04-02 LAB — FERRITIN: Ferritin: 96 ng/mL (ref 10–291)

## 2012-04-02 NOTE — Telephone Encounter (Signed)
Message copied by Evelena Leyden on Fri Apr 02, 2012  4:51 PM ------      Message from: Mariel Sleet, ERIC S      Created: Fri Apr 02, 2012  4:13 PM       Repeat cbc and ferritin in 2 months ------

## 2012-04-02 NOTE — Telephone Encounter (Signed)
Patient notified and appointment scheduled for 4/25 @ 9am.

## 2012-04-02 NOTE — Progress Notes (Signed)
Labs drawn today for cbc,ferr 

## 2012-04-02 NOTE — Telephone Encounter (Signed)
Message copied by Evelena Leyden on Fri Apr 02, 2012  4:53 PM ------      Message from: Mariel Sleet, ERIC S      Created: Fri Apr 02, 2012  4:13 PM       Repeat cbc and ferritin in 2 months ------

## 2012-05-27 ENCOUNTER — Encounter (HOSPITAL_COMMUNITY): Payer: Medicare Other | Attending: Oncology

## 2012-05-27 DIAGNOSIS — D509 Iron deficiency anemia, unspecified: Secondary | ICD-10-CM

## 2012-05-27 LAB — CBC WITH DIFFERENTIAL/PLATELET
Basophils Absolute: 0.1 10*3/uL (ref 0.0–0.1)
Basophils Relative: 1 % (ref 0–1)
HCT: 38.2 % (ref 36.0–46.0)
Hemoglobin: 12.8 g/dL (ref 12.0–15.0)
Lymphocytes Relative: 20 % (ref 12–46)
MCHC: 33.5 g/dL (ref 30.0–36.0)
Monocytes Relative: 6 % (ref 3–12)
Neutro Abs: 4.4 10*3/uL (ref 1.7–7.7)
Neutrophils Relative %: 56 % (ref 43–77)
WBC: 7.9 10*3/uL (ref 4.0–10.5)

## 2012-05-27 NOTE — Progress Notes (Signed)
Labs drawn today for cbc/diff,ferr 

## 2012-05-28 ENCOUNTER — Other Ambulatory Visit (HOSPITAL_COMMUNITY): Payer: Medicare Other

## 2012-06-17 ENCOUNTER — Encounter (HOSPITAL_COMMUNITY): Payer: Medicare Other | Attending: Oncology

## 2012-06-17 DIAGNOSIS — D509 Iron deficiency anemia, unspecified: Secondary | ICD-10-CM

## 2012-06-17 MED ORDER — SODIUM CHLORIDE 0.9 % IV SOLN
INTRAVENOUS | Status: DC
  Administered 2012-06-17: 10:00:00 via INTRAVENOUS

## 2012-06-17 MED ORDER — SODIUM CHLORIDE 0.9 % IV SOLN
1020.0000 mg | Freq: Once | INTRAVENOUS | Status: AC
  Administered 2012-06-17: 1020 mg via INTRAVENOUS
  Filled 2012-06-17: qty 34

## 2012-06-17 MED ORDER — SODIUM CHLORIDE 0.9 % IJ SOLN
10.0000 mL | INTRAMUSCULAR | Status: DC | PRN
  Administered 2012-06-17: 10 mL via INTRAVENOUS
  Filled 2012-06-17: qty 10

## 2012-06-17 NOTE — Progress Notes (Signed)
Tolerated well

## 2012-07-21 ENCOUNTER — Ambulatory Visit (HOSPITAL_COMMUNITY): Payer: Medicare Other

## 2012-08-04 ENCOUNTER — Encounter (HOSPITAL_COMMUNITY): Payer: Medicare Other | Attending: Oncology | Admitting: Oncology

## 2012-08-04 ENCOUNTER — Encounter (HOSPITAL_COMMUNITY): Payer: Self-pay | Admitting: Oncology

## 2012-08-04 VITALS — BP 149/70 | HR 77 | Temp 97.9°F | Resp 18 | Wt 100.6 lb

## 2012-08-04 DIAGNOSIS — D509 Iron deficiency anemia, unspecified: Secondary | ICD-10-CM | POA: Insufficient documentation

## 2012-08-04 DIAGNOSIS — E538 Deficiency of other specified B group vitamins: Secondary | ICD-10-CM

## 2012-08-04 LAB — CBC WITH DIFFERENTIAL/PLATELET
Basophils Absolute: 0.1 10*3/uL (ref 0.0–0.1)
Basophils Relative: 1 % (ref 0–1)
Eosinophils Absolute: 2 10*3/uL — ABNORMAL HIGH (ref 0.0–0.7)
Eosinophils Relative: 25 % — ABNORMAL HIGH (ref 0–5)
Lymphocytes Relative: 19 % (ref 12–46)
MCHC: 34.1 g/dL (ref 30.0–36.0)
MCV: 97.6 fL (ref 78.0–100.0)
Platelets: 235 10*3/uL (ref 150–400)
RDW: 13 % (ref 11.5–15.5)
WBC: 8.1 10*3/uL (ref 4.0–10.5)

## 2012-08-04 NOTE — Progress Notes (Signed)
No primary provider on file. No primary provider on file.  Iron deficiency anemia - Plan: CBC with Differential, Ferritin, CBC with Differential, Ferritin, CBC with Differential, Ferritin  B12 deficiency  CURRENT THERAPY: Intermittent IV Feraheme, last given on 06/17/2012 1020 mg  INTERVAL HISTORY: [name] [name] 79 y.o. female returns for  regular  visit for followup of chronic iron deficiency with slow GI blood losses requiring IV iron times many years but her last infusion was on 06/17/2012.    Since this is the first time I seen the patient in the clinic, we spent some time reviewing her history. He says a time getting to know the patient.  I personally reviewed and went over laboratory results with the patient. [place] at that time her hemoglobin, white blood cell count, and platelet count were within normal limits. Her ferritin was showing signs of decreasing and therefore Dr. Mariel Sleet set her up for IV Feraheme. This was infused as mentioned above in May of 2014.  Patient provided ample amount of education regarding iron deficiency anemia. We discussed the role of iron in the production of hemoglobin. We discussed the role of hemoglobin in the body. She is appreciative of this education as she did not understand exactly what iron deficiency anemia meant.  Hematologically, the patient denies any complaints and ROS questioning is negative. She denies any blood in her stool, black tarry stool, and blood loss.    Past Medical History  Diagnosis Date  . Thyroid disease     hypothyroid  . Cataract     bilateral  . Anemia     iron def  . Iron deficiency anemia 08/10/2010  . B12 deficiency 08/10/2010  . Hypertension     has Iron deficiency anemia and B12 deficiency on her problem list.     is allergic to fergon; ampicillin; cephalexin; codeine; famotidine; nizatidine; tagamet; and salmon.  [name] [name] had no medications  administered during this visit.  Past Surgical History  Procedure Laterality Date  . Vagotomy  1971  . Abdominal hysterectomy  1975  . Appendectomy  1955  . Cholecystectomy  1978  . Esophageal dilation  2001  . Esophageal dilation  2004  . Vagotomy      Denies any headaches, dizziness, double vision, fevers, chills, night sweats, nausea, vomiting, diarrhea, constipation, chest pain, heart palpitations, shortness of breath, blood in stool, black tarry stool, urinary pain, urinary burning, urinary frequency, hematuria.   PHYSICAL EXAMINATION  ECOG PERFORMANCE STATUS: 0 - Asymptomatic  Filed Vitals:   08/04/12 0900  BP: 149/70  Pulse: 77  Temp: 97.9 F (36.6 C)  Resp: 18    GENERAL:alert, no distress, well nourished, well developed, comfortable, cooperative and smiling SKIN: skin color, texture, turgor are normal, no rashes or significant lesions HEAD: Normocephalic, No masses, lesions, tenderness or abnormalities EYES: normal, PERRLA, EOMI, Conjunctiva are pink and non-injected EARS: External ears normal OROPHARYNX:mucous membranes are moist  NECK: supple, no adenopathy, thyroid normal size, non-tender, without nodularity, no stridor, non-tender, trachea midline LYMPH:  no palpable lymphadenopathy BREAST:not examined LUNGS: clear to auscultation and percussion HEART: regular rate & rhythm, no murmurs, no gallops, S1 normal and S2 normal ABDOMEN:abdomen soft, non-tender and normal bowel sounds BACK: Back symmetric, no curvature., No CVA tenderness EXTREMITIES:less then 2 second capillary refill, no joint deformities, effusion, or inflammation, no edema, no skin discoloration, no clubbing, no cyanosis  NEURO: alert & oriented x 3 with fluent speech,  no focal motor/sensory deficits, gait normal   LABORATORY DATA: CBC    Component Value Date/Time   WBC 7.9 05/27/2012 0904   RBC 3.94 05/27/2012 0904   HGB 12.8 05/27/2012 0904   HCT 38.2 05/27/2012 0904   PLT 276 05/27/2012  0904   MCV 97.0 05/27/2012 0904   MCH 32.5 05/27/2012 0904   MCHC 33.5 05/27/2012 0904   RDW 13.0 05/27/2012 0904   LYMPHSABS 1.6 05/27/2012 0904   MONOABS 0.5 05/27/2012 0904   EOSABS 1.3* 05/27/2012 0904   BASOSABS 0.1 05/27/2012 0904    Lab Results  Component Value Date   FERRITIN 53 05/27/2012      ASSESSMENT:  1. Iron deficiency anemia, secondary to slow GI losses, requiring IV Feraheme intermittently.  Last infusion was on 06/17/12 which she tolerated well. 2. Vitamin B12 Deficiency, on replacement  Patient Active Problem List   Diagnosis Date Noted  . Iron deficiency anemia 08/10/2010  . B12 deficiency 08/10/2010    PLAN:  1. I personally reviewed and went over laboratory results with the patient. 2. Labs today: CBC diff, Ferritin 3. Labs in 4 months and 8 months: CBC diff, Ferritin 4. Patient education regarding the role of Hgb throughout the body. 5. Patient education regarding iron deficiency anemia 6. Return in 8 months for follow-up  THERAPY PLAN:  The patient requires intermittent IV iron as directed by her ferritin levels. We'll continue to follow her ferritin levels and administer IV Feraheme as required. She is otherwise doing well and we'll see her back in the office for followup in 8 months.  All questions were answered. The patient knows to call the clinic with any problems, questions or concerns. We can certainly see the patient much sooner if necessary.  Patient and plan discussed with Dr. Gerarda Fraction and he is in agreement with the aforementioned.  KEFALAS,THOMAS

## 2012-08-04 NOTE — Progress Notes (Signed)
[name] [name] presented for labwork. Labs per MD order drawn via Peripheral Line 23 gauge needle inserted in left antecubital.  Good blood return present. Procedure without incident.  Needle removed intact. Patient tolerated procedure well.

## 2012-08-04 NOTE — Patient Instructions (Addendum)
[facility] Discharge Instructions  RECOMMENDATIONS MADE BY THE CONSULTANT AND ANY TEST RESULTS WILL BE SENT TO YOUR REFERRING PHYSICIAN.  Lab work today. We will call you with any abnormal/unexpected lab results. Return to clinic in 4 months and 8 months to recheck lab work. MD appointment in 8 months after lab work.  Thank you for choosing [facility] to provide your oncology and hematology care.  To afford each patient quality time with our providers, please arrive at least 15 minutes before your scheduled appointment time.  With your help, our goal is to use those 15 minutes to complete the necessary work-up to ensure our physicians have the information they need to help with your evaluation and healthcare recommendations.    Effective January 1st, 2014, we ask that you re-schedule your appointment with our physicians should you arrive 10 or more minutes late for your appointment.  We strive to give you quality time with our providers, and arriving late affects you and other patients whose appointments are after yours.    Again, thank you for choosing [facility].  Our hope is that these requests will decrease the amount of time that you wait before being seen by our physicians.       _____________________________________________________________  Should you have questions after your visit to [facility], please contact our office at [phone] between the hours of 8:30 a.m. and 5:00 p.m.  Voicemails left after 4:30 p.m. will not be returned until the following business day.  For prescription refill requests, have your pharmacy contact our office with your prescription refill request.

## 2012-12-06 ENCOUNTER — Encounter (HOSPITAL_COMMUNITY): Payer: Medicare Other | Attending: Hematology and Oncology

## 2012-12-06 DIAGNOSIS — D509 Iron deficiency anemia, unspecified: Secondary | ICD-10-CM | POA: Insufficient documentation

## 2012-12-06 LAB — CBC WITH DIFFERENTIAL/PLATELET
Eosinophils Relative: 20 % — ABNORMAL HIGH (ref 0–5)
HCT: 42.6 % (ref 36.0–46.0)
Hemoglobin: 14.1 g/dL (ref 12.0–15.0)
Lymphocytes Relative: 22 % (ref 12–46)
Lymphs Abs: 1.5 10*3/uL (ref 0.7–4.0)
MCV: 100.2 fL — ABNORMAL HIGH (ref 78.0–100.0)
Monocytes Absolute: 0.5 10*3/uL (ref 0.1–1.0)
Monocytes Relative: 7 % (ref 3–12)
RBC: 4.25 MIL/uL (ref 3.87–5.11)
WBC: 7.1 10*3/uL (ref 4.0–10.5)

## 2012-12-06 LAB — FERRITIN: Ferritin: 70 ng/mL (ref 10–291)

## 2012-12-06 NOTE — Progress Notes (Signed)
Labs drawn today for cbc/diff,ferr 

## 2012-12-07 ENCOUNTER — Other Ambulatory Visit (HOSPITAL_COMMUNITY): Payer: Self-pay | Admitting: Oncology

## 2012-12-07 DIAGNOSIS — D509 Iron deficiency anemia, unspecified: Secondary | ICD-10-CM

## 2012-12-07 DIAGNOSIS — E538 Deficiency of other specified B group vitamins: Secondary | ICD-10-CM

## 2013-02-01 ENCOUNTER — Encounter (HOSPITAL_COMMUNITY): Payer: Medicare Other | Attending: Hematology and Oncology

## 2013-02-01 DIAGNOSIS — E538 Deficiency of other specified B group vitamins: Secondary | ICD-10-CM | POA: Insufficient documentation

## 2013-02-01 DIAGNOSIS — D509 Iron deficiency anemia, unspecified: Secondary | ICD-10-CM | POA: Insufficient documentation

## 2013-02-01 LAB — CBC WITH DIFFERENTIAL/PLATELET
Basophils Relative: 1 % (ref 0–1)
HCT: 40.1 % (ref 36.0–46.0)
Hemoglobin: 12.8 g/dL (ref 12.0–15.0)
MCH: 31.6 pg (ref 26.0–34.0)
MCHC: 31.9 g/dL (ref 30.0–36.0)
MCV: 99 fL (ref 78.0–100.0)
Monocytes Absolute: 0.5 10*3/uL (ref 0.1–1.0)
Monocytes Relative: 6 % (ref 3–12)
Neutro Abs: 4.2 10*3/uL (ref 1.7–7.7)

## 2013-02-01 LAB — IRON AND TIBC: Saturation Ratios: 28 % (ref 20–55)

## 2013-02-01 NOTE — Progress Notes (Signed)
Labs drawn today for cbc/diff,Iron and IBC,ferr,b12

## 2013-02-02 ENCOUNTER — Other Ambulatory Visit (HOSPITAL_COMMUNITY): Payer: Self-pay | Admitting: Oncology

## 2013-02-02 DIAGNOSIS — D509 Iron deficiency anemia, unspecified: Secondary | ICD-10-CM

## 2013-02-10 ENCOUNTER — Encounter (HOSPITAL_COMMUNITY): Payer: Medicare HMO | Attending: Hematology and Oncology

## 2013-02-10 VITALS — BP 161/72 | HR 72 | Temp 97.5°F | Resp 20

## 2013-02-10 DIAGNOSIS — D509 Iron deficiency anemia, unspecified: Secondary | ICD-10-CM | POA: Insufficient documentation

## 2013-02-10 MED ORDER — SODIUM CHLORIDE 0.9 % IV SOLN
1020.0000 mg | Freq: Once | INTRAVENOUS | Status: AC
  Administered 2013-02-10: 1020 mg via INTRAVENOUS
  Filled 2013-02-10: qty 34

## 2013-02-10 NOTE — Progress Notes (Signed)
1100 - Pt states she premedicated at home with Tylenol 650mg  and Benadryl 25mg . 1128 - infusion complete; tolerated well.

## 2013-04-05 ENCOUNTER — Encounter (HOSPITAL_COMMUNITY): Payer: Medicare HMO | Attending: Hematology and Oncology

## 2013-04-05 DIAGNOSIS — D509 Iron deficiency anemia, unspecified: Secondary | ICD-10-CM | POA: Insufficient documentation

## 2013-04-05 DIAGNOSIS — E538 Deficiency of other specified B group vitamins: Secondary | ICD-10-CM | POA: Insufficient documentation

## 2013-04-05 LAB — CBC WITH DIFFERENTIAL/PLATELET
Basophils Absolute: 0.1 10*3/uL (ref 0.0–0.1)
Basophils Relative: 1 % (ref 0–1)
EOS ABS: 1.8 10*3/uL — AB (ref 0.0–0.7)
Eosinophils Relative: 29 % — ABNORMAL HIGH (ref 0–5)
HCT: 42.7 % (ref 36.0–46.0)
HEMOGLOBIN: 14.5 g/dL (ref 12.0–15.0)
LYMPHS ABS: 1.5 10*3/uL (ref 0.7–4.0)
LYMPHS PCT: 24 % (ref 12–46)
MCH: 33.6 pg (ref 26.0–34.0)
MCHC: 34 g/dL (ref 30.0–36.0)
MCV: 99.1 fL (ref 78.0–100.0)
MONOS PCT: 5 % (ref 3–12)
Monocytes Absolute: 0.3 10*3/uL (ref 0.1–1.0)
NEUTROS ABS: 2.6 10*3/uL (ref 1.7–7.7)
Neutrophils Relative %: 41 % — ABNORMAL LOW (ref 43–77)
PLATELETS: 256 10*3/uL (ref 150–400)
RBC: 4.31 MIL/uL (ref 3.87–5.11)
RDW: 13.6 % (ref 11.5–15.5)
WBC: 6.4 10*3/uL (ref 4.0–10.5)

## 2013-04-05 LAB — FERRITIN: FERRITIN: 236 ng/mL (ref 10–291)

## 2013-04-05 NOTE — Progress Notes (Signed)
[name] [name] presented for labwork. Labs per MD order drawn via Peripheral Line 23 gauge needle inserted in right AC.  Good blood return present. Procedure without incident.  Needle removed intact. Patient tolerated procedure well.

## 2013-04-08 ENCOUNTER — Encounter (HOSPITAL_BASED_OUTPATIENT_CLINIC_OR_DEPARTMENT_OTHER): Payer: Medicare HMO

## 2013-04-08 ENCOUNTER — Encounter (HOSPITAL_COMMUNITY): Payer: Self-pay

## 2013-04-08 VITALS — BP 134/70 | HR 77 | Temp 97.8°F | Resp 16 | Wt 107.0 lb

## 2013-04-08 DIAGNOSIS — E039 Hypothyroidism, unspecified: Secondary | ICD-10-CM

## 2013-04-08 DIAGNOSIS — K922 Gastrointestinal hemorrhage, unspecified: Secondary | ICD-10-CM

## 2013-04-08 DIAGNOSIS — E538 Deficiency of other specified B group vitamins: Secondary | ICD-10-CM

## 2013-04-08 DIAGNOSIS — D509 Iron deficiency anemia, unspecified: Secondary | ICD-10-CM

## 2013-04-08 NOTE — Progress Notes (Signed)
[place]  OFFICE PROGRESS NOTE  No primary provider on file. No primary provider on file.  DIAGNOSIS: Iron deficiency anemia - Plan: levothyroxine (SYNTHROID, LEVOTHROID) 112 MCG tablet, CBC with Differential, Ferritin  B12 deficiency - Plan: levothyroxine (SYNTHROID, LEVOTHROID) 112 MCG tablet, CBC with Differential, Ferritin  Chief Complaint  Patient presents with  . B12 and iron deficiency     CURRENT THERAPY: Intermittent Feraheme therapy last given 02/10/2013.  INTERVAL HISTORY: [name] [name] 80 y.o. female returns for followup of iron deficiency and vitamin B 12 deficiency receiving intermittent IV iron infusion for chronic GI blood loss and taking oral vitamin B12 daily. She continues to feel well with no shortness of breath, chest pain, PND, orthopnea, melena, hematochezia, hematuria, vaginal bleeding, epistaxis, or hemoptysis. Urine color is normal. She denies any fever, night sweats, easy satiety, lymphadenopathy, cough, wheezing, headache, joint pain, skin rash, or seizures.  MEDICAL HISTORY: Past Medical History  Diagnosis Date  . Thyroid disease     hypothyroid  . Cataract     bilateral  . Anemia     iron def  . Iron deficiency anemia 08/10/2010  . B12 deficiency 08/10/2010  . Hypertension     INTERIM HISTORY: has Iron deficiency anemia and B12 deficiency on her problem list.    ALLERGIES:  is allergic to fergon; ampicillin; cephalexin; codeine; famotidine; nizatidine; tagamet; and salmon.  MEDICATIONS: has a current medication list which includes the following prescription(s): acetaminophen, vitamin c, calcitonin (salmon), calcium carbonate-vitamin d, diphenhydramine, estradiol, levothyroxine, loperamide, losartan, omeprazole, and vitamin b-12.  SURGICAL HISTORY:  Past Surgical History  Procedure Laterality Date  . Vagotomy  1971  . Abdominal hysterectomy  1975  . Appendectomy  1955  . Cholecystectomy  1978  .  Esophageal dilation  2001  . Esophageal dilation  2004  . Vagotomy      FAMILY HISTORY: family history includes Cancer in her sister; Hypertension in her brother.  SOCIAL HISTORY:  reports that she has never smoked. She has never used smokeless tobacco. She reports that she drinks about 4.2 ounces of alcohol per week. She reports that she does not use illicit drugs.  REVIEW OF SYSTEMS:  Other than that discussed above is noncontributory.  PHYSICAL EXAMINATION: ECOG PERFORMANCE STATUS: 0 - Asymptomatic  Blood pressure 134/70, pulse 77, temperature 97.8 F (36.6 C), temperature source Oral, resp. rate 16, weight 107 lb (48.535 kg).  GENERAL:alert, no distress and comfortable SKIN: skin color, texture, turgor are normal, no rashes or significant lesions EYES: PERLA; Conjunctiva are pink and non-injected, sclera clear OROPHARYNX:no exudate, no erythema on lips, buccal mucosa, or tongue. NECK: supple, thyroid normal size, non-tender, without nodularity. No masses CHEST: Normal AP diameter. LYMPH:  no palpable lymphadenopathy in the cervical, axillary or inguinal LUNGS: clear to auscultation and percussion with normal breathing effort HEART: regular rate & rhythm and no murmurs. ABDOMEN:abdomen soft, non-tender and normal bowel sounds MUSCULOSKELETAL:no cyanosis of digits and no clubbing. Range of motion normal.  NEURO: alert & oriented x 3 with fluent speech, no focal motor/sensory deficits   LABORATORY DATA: Infusion on 04/05/2013  Component Date Value Ref Range Status  . WBC 04/05/2013 6.4  4.0 - 10.5 K/uL Final  . RBC 04/05/2013 4.31  3.87 - 5.11 MIL/uL Final  . Hemoglobin 04/05/2013 14.5  12.0 - 15.0 g/dL Final  . HCT 04/05/2013 42.7  36.0 - 46.0 % Final  . MCV 04/05/2013 99.1  78.0 -  100.0 fL Final  . MCH 04/05/2013 33.6  26.0 - 34.0 pg Final  . MCHC 04/05/2013 34.0  30.0 - 36.0 g/dL Final  . RDW 04/05/2013 13.6  11.5 - 15.5 % Final  . Platelets 04/05/2013 256  150 - 400  K/uL Final  . Neutrophils Relative % 04/05/2013 41* 43 - 77 % Final  . Neutro Abs 04/05/2013 2.6  1.7 - 7.7 K/uL Final  . Lymphocytes Relative 04/05/2013 24  12 - 46 % Final  . Lymphs Abs 04/05/2013 1.5  0.7 - 4.0 K/uL Final  . Monocytes Relative 04/05/2013 5  3 - 12 % Final  . Monocytes Absolute 04/05/2013 0.3  0.1 - 1.0 K/uL Final  . Eosinophils Relative 04/05/2013 29* 0 - 5 % Final   CONFIRMED BY SMEAR  . Eosinophils Absolute 04/05/2013 1.8* 0.0 - 0.7 K/uL Final  . Basophils Relative 04/05/2013 1  0 - 1 % Final  . Basophils Absolute 04/05/2013 0.1  0.0 - 0.1 K/uL Final  . WBC Morphology 04/05/2013 ATYPICAL LYMPHOCYTES   Final  . Ferritin 04/05/2013 236  10 - 291 ng/mL Final   Performed at [place]: No new pathology.  Urinalysis    Component Value Date/Time   COLORURINE YELLOW 10/11/2011 2026   APPEARANCEUR CLEAR 10/11/2011 2026   LABSPEC 1.013 10/11/2011 2026   PHURINE 6.5 10/11/2011 2026   GLUCOSEU NEGATIVE 10/11/2011 2026   HGBUR NEGATIVE 10/11/2011 2026   BILIRUBINUR NEGATIVE 10/11/2011 2026   KETONESUR 15* 10/11/2011 2026   PROTEINUR NEGATIVE 10/11/2011 2026   UROBILINOGEN 0.2 10/11/2011 2026   NITRITE NEGATIVE 10/11/2011 2026   LEUKOCYTESUR NEGATIVE 10/11/2011 2026    RADIOGRAPHIC STUDIES: No results found.  ASSESSMENT:  #1. Status post IV iron infusion on 02/10/2013 with result in normalization of ferritin, chronic GI blood loss requiring intravenous iron periodically, last treatment prior to the most recent one administered in May of 2014. #2. Hypothyroidism, on treatment. #3. By the B12 deficiency with normal CBC, taking oral vitamin B12.   PLAN:  #1. Continue surveillance with CBC and ferritin in 3 months with office visit, CBC, and ferritin in 6 months. #2. Patient was told to call should he develop any new symptoms that are troublesome and persistent.   All questions were answered. The patient knows to call the clinic with any problems, questions or  concerns. We can certainly see the patient much sooner if necessary.   I spent 25 minutes counseling the patient face to face. The total time spent in the appointment was 30 minutes.    Doroteo Bradford, MD 04/08/2013 1:00 PM

## 2013-04-08 NOTE — Patient Instructions (Signed)
[facility] Discharge Instructions  RECOMMENDATIONS MADE BY THE CONSULTANT AND ANY TEST RESULTS WILL BE SENT TO YOUR REFERRING PHYSICIAN.  EXAM FINDINGS BY THE PHYSICIAN TODAY AND SIGNS OR SYMPTOMS TO REPORT TO CLINIC OR PRIMARY PHYSICIAN: Exam and findings as discussed by Dr. Barnet Glasgow.  Your labs are stable.  Report increased fatigue or shortness of breath.  MEDICATIONS PRESCRIBED:  none  INSTRUCTIONS/FOLLOW-UP: Follow-up in 3 and 6 months with blood work and office visit in 6 months.  Thank you for choosing [facility] to provide your oncology and hematology care.  To afford each patient quality time with our providers, please arrive at least 15 minutes before your scheduled appointment time.  With your help, our goal is to use those 15 minutes to complete the necessary work-up to ensure our physicians have the information they need to help with your evaluation and healthcare recommendations.    Effective January 1st, 2014, we ask that you re-schedule your appointment with our physicians should you arrive 10 or more minutes late for your appointment.  We strive to give you quality time with our providers, and arriving late affects you and other patients whose appointments are after yours.    Again, thank you for choosing [facility].  Our hope is that these requests will decrease the amount of time that you wait before being seen by our physicians.       _____________________________________________________________  Should you have questions after your visit to [facility], please contact our office at [phone] between the hours of 8:30 a.m. and 5:00 p.m.  Voicemails left after 4:30 p.m. will not be returned until the following business day.  For prescription refill requests, have your pharmacy contact our office with your prescription refill request.

## 2013-07-15 ENCOUNTER — Encounter (HOSPITAL_COMMUNITY): Payer: Medicare HMO | Attending: Hematology and Oncology

## 2013-07-15 ENCOUNTER — Encounter (HOSPITAL_COMMUNITY): Payer: Self-pay | Admitting: Hematology and Oncology

## 2013-07-15 DIAGNOSIS — E538 Deficiency of other specified B group vitamins: Secondary | ICD-10-CM | POA: Insufficient documentation

## 2013-07-15 DIAGNOSIS — D721 Eosinophilia, unspecified: Secondary | ICD-10-CM | POA: Insufficient documentation

## 2013-07-15 DIAGNOSIS — D509 Iron deficiency anemia, unspecified: Secondary | ICD-10-CM | POA: Insufficient documentation

## 2013-07-15 LAB — CBC WITH DIFFERENTIAL/PLATELET
BASOS ABS: 0.1 10*3/uL (ref 0.0–0.1)
Basophils Relative: 2 % — ABNORMAL HIGH (ref 0–1)
Eosinophils Absolute: 2.1 10*3/uL — ABNORMAL HIGH (ref 0.0–0.7)
Eosinophils Relative: 27 % — ABNORMAL HIGH (ref 0–5)
HEMATOCRIT: 38.3 % (ref 36.0–46.0)
HEMOGLOBIN: 13 g/dL (ref 12.0–15.0)
LYMPHS PCT: 19 % (ref 12–46)
Lymphs Abs: 1.5 10*3/uL (ref 0.7–4.0)
MCH: 33.3 pg (ref 26.0–34.0)
MCHC: 33.9 g/dL (ref 30.0–36.0)
MCV: 98.2 fL (ref 78.0–100.0)
MONO ABS: 0.5 10*3/uL (ref 0.1–1.0)
MONOS PCT: 6 % (ref 3–12)
NEUTROS ABS: 3.8 10*3/uL (ref 1.7–7.7)
NEUTROS PCT: 47 % (ref 43–77)
Platelets: 271 10*3/uL (ref 150–400)
RBC: 3.9 MIL/uL (ref 3.87–5.11)
RDW: 12.6 % (ref 11.5–15.5)
WBC: 8 10*3/uL (ref 4.0–10.5)

## 2013-07-15 LAB — FERRITIN: Ferritin: 62 ng/mL (ref 10–291)

## 2013-07-15 NOTE — Progress Notes (Signed)
Lab draw

## 2013-10-07 ENCOUNTER — Encounter (HOSPITAL_COMMUNITY): Payer: Medicare HMO | Attending: Hematology and Oncology

## 2013-10-07 DIAGNOSIS — D509 Iron deficiency anemia, unspecified: Secondary | ICD-10-CM | POA: Insufficient documentation

## 2013-10-07 DIAGNOSIS — E538 Deficiency of other specified B group vitamins: Secondary | ICD-10-CM | POA: Insufficient documentation

## 2013-10-07 LAB — CBC WITH DIFFERENTIAL/PLATELET
BASOS PCT: 1 % (ref 0–1)
Basophils Absolute: 0.1 10*3/uL (ref 0.0–0.1)
EOS ABS: 2.7 10*3/uL — AB (ref 0.0–0.7)
EOS PCT: 32 % — AB (ref 0–5)
HCT: 38.7 % (ref 36.0–46.0)
Hemoglobin: 13 g/dL (ref 12.0–15.0)
LYMPHS ABS: 2.1 10*3/uL (ref 0.7–4.0)
Lymphocytes Relative: 24 % (ref 12–46)
MCH: 32.3 pg (ref 26.0–34.0)
MCHC: 33.6 g/dL (ref 30.0–36.0)
MCV: 96.3 fL (ref 78.0–100.0)
MONOS PCT: 5 % (ref 3–12)
Monocytes Absolute: 0.4 10*3/uL (ref 0.1–1.0)
NEUTROS PCT: 39 % — AB (ref 43–77)
Neutro Abs: 3.4 10*3/uL (ref 1.7–7.7)
PLATELETS: 290 10*3/uL (ref 150–400)
RBC: 4.02 MIL/uL (ref 3.87–5.11)
RDW: 12.7 % (ref 11.5–15.5)
WBC: 8.7 10*3/uL (ref 4.0–10.5)

## 2013-10-07 NOTE — Progress Notes (Signed)
[name] [name] presented for labwork. Labs per MD order drawn via Peripheral Line 21 gauge needle inserted in rt ac.  Good blood return present. Procedure without incident.  Needle removed intact. Patient tolerated procedure well.

## 2013-10-08 LAB — FERRITIN: Ferritin: 26 ng/mL (ref 10–291)

## 2013-10-11 ENCOUNTER — Encounter (HOSPITAL_COMMUNITY): Payer: Self-pay

## 2013-10-11 ENCOUNTER — Encounter (HOSPITAL_BASED_OUTPATIENT_CLINIC_OR_DEPARTMENT_OTHER): Payer: Medicare HMO

## 2013-10-11 ENCOUNTER — Encounter (HOSPITAL_COMMUNITY): Payer: Medicare HMO

## 2013-10-11 VITALS — BP 136/58 | HR 72 | Temp 98.1°F | Resp 14 | Wt 106.4 lb

## 2013-10-11 DIAGNOSIS — E038 Other specified hypothyroidism: Secondary | ICD-10-CM

## 2013-10-11 DIAGNOSIS — K219 Gastro-esophageal reflux disease without esophagitis: Secondary | ICD-10-CM

## 2013-10-11 DIAGNOSIS — E538 Deficiency of other specified B group vitamins: Secondary | ICD-10-CM

## 2013-10-11 DIAGNOSIS — D509 Iron deficiency anemia, unspecified: Secondary | ICD-10-CM

## 2013-10-11 NOTE — Progress Notes (Signed)
[facility]  OFFICE PROGRESS NOTE  Myriam Jacobson, [place], [place] [address]  DIAGNOSIS: Iron deficiency anemia  B12 deficiency  Other specified hypothyroidism  Gastroesophageal reflux disease without esophagitis  Chief Complaint  Patient presents with  . Iron deficiency anemia    CURRENT THERAPY: Feraheme therapy last given on 02/10/2013  INTERVAL HISTORY: [name] [name] 80 y.o. female returns for followup of iron deficiency and vitamin B 12 deficiency receiving intermittent IV iron infusion for chronic GI blood loss and taking oral vitamin B12 daily. She continues to do well with no nausea, vomiting, diarrhea, constipation, melena, hematochezia, hematuria, epistaxis, or mop this is. She denies any abdominal distention, back pain, fever, night sweats, skin rash, headache, or seizures.    MEDICAL HISTORY: Past Medical History  Diagnosis Date  . Thyroid disease     hypothyroid  . Cataract     bilateral  . Anemia     iron def  . Iron deficiency anemia 08/10/2010  . B12 deficiency 08/10/2010  . Hypertension     INTERIM HISTORY: has Iron deficiency anemia; B12 deficiency; and Eosinophilia on her problem list.    ALLERGIES:  is allergic to fergon; ampicillin; cephalexin; codeine; famotidine; nizatidine; tagamet; and salmon.  MEDICATIONS: has a current medication list which includes the following prescription(s): acetaminophen, vitamin c, b complex vitamins, calcitonin (salmon), calcium carbonate-vitamin d, diphenhydramine, estradiol, levothyroxine, loperamide, losartan, omeprazole, and vitamin b-12.  SURGICAL HISTORY:  Past Surgical History  Procedure Laterality Date  . Vagotomy  1971  . Abdominal hysterectomy  1975  . Appendectomy  1955  . Cholecystectomy  1978  . Esophageal dilation  2001  . Esophageal dilation  2004  . Vagotomy      FAMILY HISTORY: family history includes Cancer in her sister;  Hypertension in her brother.  SOCIAL HISTORY:  reports that she has never smoked. She has never used smokeless tobacco. She reports that she drinks about 4.2 ounces of alcohol per week. She reports that she does not use illicit drugs.  REVIEW OF SYSTEMS:  Other than that discussed above is noncontributory.  PHYSICAL EXAMINATION: ECOG PERFORMANCE STATUS: 0 - Asymptomatic  Blood pressure 136/58, pulse 72, temperature 98.1 F (36.7 C), temperature source Oral, resp. rate 14, weight 106 lb 6.4 oz (48.263 kg), SpO2 100.00%.  GENERAL:alert, no distress and comfortable SKIN: skin color, texture, turgor are normal, no rashes or significant lesions EYES: PERLA; Conjunctiva are pink and non-injected, sclera clear SINUSES: No redness or tenderness over maxillary or ethmoid sinuses OROPHARYNX:no exudate, no erythema on lips, buccal mucosa, or tongue. NECK: supple, thyroid normal size, non-tender, without nodularity. No masses CHEST: Normal AP diameter with no breast masses. LYMPH:  no palpable lymphadenopathy in the cervical, axillary or inguinal LUNGS: clear to auscultation and percussion with normal breathing effort HEART: regular rate & rhythm and no murmurs. ABDOMEN:abdomen soft, non-tender and normal bowel sounds MUSCULOSKELETAL:no cyanosis of digits and no clubbing. Range of motion normal.  NEURO: alert & oriented x 3 with fluent speech, no focal motor/sensory deficits   LABORATORY DATA: Lab on 10/07/2013  Component Date Value Ref Range Status  . WBC 10/07/2013 8.7  4.0 - 10.5 K/uL Final  . RBC 10/07/2013 4.02  3.87 - 5.11 MIL/uL Final  . Hemoglobin 10/07/2013 13.0  12.0 - 15.0 g/dL Final  . HCT 10/07/2013 38.7  36.0 - 46.0 % Final  . MCV 10/07/2013 96.3  78.0 - 100.0  fL Final  . MCH 10/07/2013 32.3  26.0 - 34.0 pg Final  . MCHC 10/07/2013 33.6  30.0 - 36.0 g/dL Final  . RDW 10/07/2013 12.7  11.5 - 15.5 % Final  . Platelets 10/07/2013 290  150 - 400 K/uL Final  . Neutrophils Relative  % 10/07/2013 39* 43 - 77 % Final  . Neutro Abs 10/07/2013 3.4  1.7 - 7.7 K/uL Final  . Lymphocytes Relative 10/07/2013 24  12 - 46 % Final  . Lymphs Abs 10/07/2013 2.1  0.7 - 4.0 K/uL Final  . Monocytes Relative 10/07/2013 5  3 - 12 % Final  . Monocytes Absolute 10/07/2013 0.4  0.1 - 1.0 K/uL Final  . Eosinophils Relative 10/07/2013 32* 0 - 5 % Final  . Eosinophils Absolute 10/07/2013 2.7* 0.0 - 0.7 K/uL Final  . Basophils Relative 10/07/2013 1  0 - 1 % Final  . Basophils Absolute 10/07/2013 0.1  0.0 - 0.1 K/uL Final  . Ferritin 10/07/2013 26  10 - 291 ng/mL Corrected   Performed at [facility]:  No new pathology  Urinalysis    Component Value Date/Time   COLORURINE YELLOW 10/11/2011 2026   APPEARANCEUR CLEAR 10/11/2011 2026   LABSPEC 1.013 10/11/2011 2026   PHURINE 6.5 10/11/2011 2026   GLUCOSEU NEGATIVE 10/11/2011 2026   HGBUR NEGATIVE 10/11/2011 2026   BILIRUBINUR NEGATIVE 10/11/2011 2026   KETONESUR 15* 10/11/2011 2026   PROTEINUR NEGATIVE 10/11/2011 2026   UROBILINOGEN 0.2 10/11/2011 2026   NITRITE NEGATIVE 10/11/2011 2026   LEUKOCYTESUR NEGATIVE 10/11/2011 2026    RADIOGRAPHIC STUDIES: No results found.  ASSESSMENT:   #1. Status post IV iron infusion on 02/10/2013 with result in normalization of ferritin, chronic GI blood loss requiring intravenous iron periodically, last treatment prior to the most recent one administered in May of 2014, now decreased ferritin again with out evidence of anemia.  #2. Hypothyroidism, on treatment.  #3. Vitamin B12 deficiency with normal CBC, taking oral vitamin B12.       PLAN:  #1. IV Feraheme on 10/12/2013 and 10/19/2013. #2. CBC, ferritin in 3 months. #3. Office visit with CBC and ferritin in 6 months. #4. Patient will receive influenza virus vaccine next month for her family physician.   All questions were answered. The patient knows to call the clinic with any problems, questions or concerns. We can certainly see the patient  much sooner if necessary.   I spent 25 minutes counseling the patient face to face. The total time spent in the appointment was 30 minutes.    Doroteo Bradford, MD 10/11/2013 10:15 AM  DISCLAIMER:  This note was dictated with voice recognition software.  Similar sounding words can inadvertently be transcribed inaccurately and may not be corrected upon review.

## 2013-10-11 NOTE — Patient Instructions (Addendum)
[facility] Discharge Instructions  RECOMMENDATIONS MADE BY THE CONSULTANT AND ANY TEST RESULTS WILL BE SENT TO YOUR REFERRING PHYSICIAN.  EXAM FINDINGS BY THE PHYSICIAN TODAY AND SIGNS OR SYMPTOMS TO REPORT TO CLINIC OR PRIMARY PHYSICIAN: Exam and findings as discussed by Dr. Barnet Glasgow.  We will give you feraheme and will recheck your blood work in 3 and 6 months and office visit in 6 months. Report increased fatigue, shortness of breath or other concerns.  INSTRUCTIONS/FOLLOW-UP: Feraheme infusions 9/9 & 9/16, labs in 3 and 6 months and office visit in 6 months.  Thank you for choosing [facility] to provide your oncology and hematology care.  To afford each patient quality time with our providers, please arrive at least 15 minutes before your scheduled appointment time.  With your help, our goal is to use those 15 minutes to complete the necessary work-up to ensure our physicians have the information they need to help with your evaluation and healthcare recommendations.    Effective January 1st, 2014, we ask that you re-schedule your appointment with our physicians should you arrive 10 or more minutes late for your appointment.  We strive to give you quality time with our providers, and arriving late affects you and other patients whose appointments are after yours.    Again, thank you for choosing [facility].  Our hope is that these requests will decrease the amount of time that you wait before being seen by our physicians.       _____________________________________________________________  Should you have questions after your visit to [facility], please contact our office at [phone] between the hours of 8:30 a.m. and 4:30 p.m.  Voicemails left after 4:30 p.m. will not be returned until the following business day.  For prescription refill requests, have your pharmacy contact our office with your prescription refill request.     _______________________________________________________________  We hope that we have given you very good care.  You may receive a patient satisfaction survey in the mail, please complete it and return it as soon as possible.  We value your feedback!  _______________________________________________________________  Have you asked about our STAR program?  STAR stands for Survivorship Training and Rehabilitation, and this is a nationally recognized cancer care program that focuses on survivorship and rehabilitation.  Cancer and cancer treatments may cause problems, such as, pain, making you feel tired and keeping you from doing the things that you need or want to do. Cancer rehabilitation can help. Our goal is to reduce these troubling effects and help you have the best quality of life possible.  You may receive a survey from a nurse that asks questions about your current state of health.  Based on the survey results, all eligible patients will be referred to the [facility] program for an evaluation so we can better serve you!  A frequently asked questions sheet is available upon request.

## 2013-10-11 NOTE — Addendum Note (Signed)
Addended by: Mellissa Kohut on: 10/11/2013 10:45 AM   Modules accepted: Orders

## 2013-10-12 ENCOUNTER — Encounter (HOSPITAL_BASED_OUTPATIENT_CLINIC_OR_DEPARTMENT_OTHER): Payer: Medicare HMO

## 2013-10-12 VITALS — BP 151/65 | HR 70 | Temp 98.1°F | Resp 20

## 2013-10-12 DIAGNOSIS — D509 Iron deficiency anemia, unspecified: Secondary | ICD-10-CM

## 2013-10-12 MED ORDER — SODIUM CHLORIDE 0.9 % IJ SOLN
10.0000 mL | INTRAMUSCULAR | Status: DC | PRN
  Administered 2013-10-12: 10 mL

## 2013-10-12 MED ORDER — SODIUM CHLORIDE 0.9 % IV SOLN
Freq: Once | INTRAVENOUS | Status: AC
  Administered 2013-10-12: 14:00:00 via INTRAVENOUS

## 2013-10-12 MED ORDER — SODIUM CHLORIDE 0.9 % IV SOLN
510.0000 mg | Freq: Once | INTRAVENOUS | Status: AC
  Administered 2013-10-12: 510 mg via INTRAVENOUS
  Filled 2013-10-12: qty 17

## 2013-10-12 MED ORDER — DIPHENHYDRAMINE HCL 25 MG PO CAPS
ORAL_CAPSULE | ORAL | Status: AC
  Filled 2013-10-12: qty 1

## 2013-10-12 MED ORDER — ACETAMINOPHEN 325 MG PO TABS
650.0000 mg | ORAL_TABLET | Freq: Once | ORAL | Status: AC
  Administered 2013-10-12: 650 mg via ORAL

## 2013-10-12 MED ORDER — ACETAMINOPHEN 325 MG PO TABS
ORAL_TABLET | ORAL | Status: AC
  Filled 2013-10-12: qty 2

## 2013-10-12 MED ORDER — DIPHENHYDRAMINE HCL 25 MG PO CAPS
25.0000 mg | ORAL_CAPSULE | Freq: Once | ORAL | Status: AC
  Administered 2013-10-12: 25 mg via ORAL

## 2013-10-12 NOTE — Patient Instructions (Signed)
[facility] Discharge Instructions  RECOMMENDATIONS MADE BY THE CONSULTANT AND ANY TEST RESULTS WILL BE SENT TO YOUR REFERRING PHYSICIAN.  MEDICATIONS PRESCRIBED:  Feraheme infusion today.  INSTRUCTIONS/FOLLOW-UP: Return to clinic as scheduled. Report any issues/concerns to clinic as needed.  Thank you for choosing [facility] to provide your oncology and hematology care.  To afford each patient quality time with our providers, please arrive at least 15 minutes before your scheduled appointment time.  With your help, our goal is to use those 15 minutes to complete the necessary work-up to ensure our physicians have the information they need to help with your evaluation and healthcare recommendations.    Effective January 1st, 2014, we ask that you re-schedule your appointment with our physicians should you arrive 10 or more minutes late for your appointment.  We strive to give you quality time with our providers, and arriving late affects you and other patients whose appointments are after yours.    Again, thank you for choosing [facility].  Our hope is that these requests will decrease the amount of time that you wait before being seen by our physicians.       _____________________________________________________________  Should you have questions after your visit to [facility], please contact our office at [phone] between the hours of 8:30 a.m. and 4:30 p.m.  Voicemails left after 4:30 p.m. will not be returned until the following business day.  For prescription refill requests, have your pharmacy contact our office with your prescription refill request.    _______________________________________________________________  We hope that we have given you very good care.  You may receive a patient satisfaction survey in the mail, please complete it and return it as soon as possible.  We value your  feedback!  _______________________________________________________________  Have you asked about our STAR program?  STAR stands for Survivorship Training and Rehabilitation, and this is a nationally recognized cancer care program that focuses on survivorship and rehabilitation.  Cancer and cancer treatments may cause problems, such as, pain, making you feel tired and keeping you from doing the things that you need or want to do. Cancer rehabilitation can help. Our goal is to reduce these troubling effects and help you have the best quality of life possible.  You may receive a survey from a nurse that asks questions about your current state of health.  Based on the survey results, all eligible patients will be referred to the [facility] program for an evaluation so we can better serve you!  A frequently asked questions sheet is available upon request.

## 2013-10-12 NOTE — Progress Notes (Signed)
Tolerated well

## 2013-10-19 ENCOUNTER — Encounter (HOSPITAL_BASED_OUTPATIENT_CLINIC_OR_DEPARTMENT_OTHER): Payer: Medicare HMO

## 2013-10-19 ENCOUNTER — Encounter (HOSPITAL_COMMUNITY): Payer: Self-pay

## 2013-10-19 VITALS — BP 150/58 | HR 62 | Temp 98.1°F | Resp 20

## 2013-10-19 DIAGNOSIS — D509 Iron deficiency anemia, unspecified: Secondary | ICD-10-CM

## 2013-10-19 MED ORDER — SODIUM CHLORIDE 0.9 % IV SOLN
Freq: Once | INTRAVENOUS | Status: AC
  Administered 2013-10-19: 13:00:00 via INTRAVENOUS

## 2013-10-19 MED ORDER — SODIUM CHLORIDE 0.9 % IV SOLN
510.0000 mg | Freq: Once | INTRAVENOUS | Status: AC
  Administered 2013-10-19: 510 mg via INTRAVENOUS
  Filled 2013-10-19: qty 17

## 2013-10-19 MED ORDER — SODIUM CHLORIDE 0.9 % IJ SOLN
10.0000 mL | INTRAMUSCULAR | Status: DC | PRN
  Administered 2013-10-19: 10 mL

## 2013-10-19 NOTE — Progress Notes (Signed)
Tolerated infusion well. 

## 2013-11-30 ENCOUNTER — Other Ambulatory Visit: Payer: Self-pay | Admitting: Nurse Practitioner

## 2014-01-10 ENCOUNTER — Encounter (HOSPITAL_COMMUNITY): Payer: Medicare HMO | Attending: Hematology and Oncology

## 2014-01-10 DIAGNOSIS — D509 Iron deficiency anemia, unspecified: Secondary | ICD-10-CM | POA: Diagnosis present

## 2014-01-10 LAB — CBC WITH DIFFERENTIAL/PLATELET
BASOS PCT: 1 % (ref 0–1)
Basophils Absolute: 0.1 10*3/uL (ref 0.0–0.1)
EOS ABS: 1.4 10*3/uL — AB (ref 0.0–0.7)
Eosinophils Relative: 18 % — ABNORMAL HIGH (ref 0–5)
HCT: 39.8 % (ref 36.0–46.0)
Hemoglobin: 13.4 g/dL (ref 12.0–15.0)
LYMPHS ABS: 1.4 10*3/uL (ref 0.7–4.0)
Lymphocytes Relative: 19 % (ref 12–46)
MCH: 33.1 pg (ref 26.0–34.0)
MCHC: 33.7 g/dL (ref 30.0–36.0)
MCV: 98.3 fL (ref 78.0–100.0)
Monocytes Absolute: 0.4 10*3/uL (ref 0.1–1.0)
Monocytes Relative: 6 % (ref 3–12)
NEUTROS PCT: 56 % (ref 43–77)
Neutro Abs: 4.1 10*3/uL (ref 1.7–7.7)
PLATELETS: 266 10*3/uL (ref 150–400)
RBC: 4.05 MIL/uL (ref 3.87–5.11)
RDW: 12.8 % (ref 11.5–15.5)
WBC: 7.4 10*3/uL (ref 4.0–10.5)

## 2014-01-10 LAB — FERRITIN: FERRITIN: 127 ng/mL (ref 10–291)

## 2014-01-10 NOTE — Progress Notes (Signed)
LABS FOR CBCD,FERR  

## 2014-04-11 ENCOUNTER — Ambulatory Visit (HOSPITAL_COMMUNITY): Payer: Medicare HMO

## 2014-04-11 ENCOUNTER — Encounter (HOSPITAL_COMMUNITY): Payer: Medicare HMO | Attending: Hematology & Oncology

## 2014-04-11 DIAGNOSIS — D509 Iron deficiency anemia, unspecified: Secondary | ICD-10-CM | POA: Diagnosis present

## 2014-04-11 DIAGNOSIS — E538 Deficiency of other specified B group vitamins: Secondary | ICD-10-CM | POA: Diagnosis not present

## 2014-04-11 LAB — CBC WITH DIFFERENTIAL/PLATELET
BASOS PCT: 0 % (ref 0–1)
Basophils Absolute: 0 10*3/uL (ref 0.0–0.1)
Eosinophils Absolute: 0 10*3/uL (ref 0.0–0.7)
Eosinophils Relative: 0 % (ref 0–5)
HEMATOCRIT: 40.7 % (ref 36.0–46.0)
Hemoglobin: 13.6 g/dL (ref 12.0–15.0)
LYMPHS PCT: 10 % — AB (ref 12–46)
Lymphs Abs: 1.4 10*3/uL (ref 0.7–4.0)
MCH: 33 pg (ref 26.0–34.0)
MCHC: 33.4 g/dL (ref 30.0–36.0)
MCV: 98.8 fL (ref 78.0–100.0)
MONO ABS: 0.7 10*3/uL (ref 0.1–1.0)
MONOS PCT: 5 % (ref 3–12)
Neutro Abs: 12.3 10*3/uL — ABNORMAL HIGH (ref 1.7–7.7)
Neutrophils Relative %: 85 % — ABNORMAL HIGH (ref 43–77)
PLATELETS: 335 10*3/uL (ref 150–400)
RBC: 4.12 MIL/uL (ref 3.87–5.11)
RDW: 12.6 % (ref 11.5–15.5)
WBC: 14.5 10*3/uL — ABNORMAL HIGH (ref 4.0–10.5)

## 2014-04-11 LAB — FERRITIN: FERRITIN: 57 ng/mL (ref 10–291)

## 2014-04-11 NOTE — Progress Notes (Signed)
Labs for cbcd ferr 

## 2014-04-12 ENCOUNTER — Encounter (HOSPITAL_BASED_OUTPATIENT_CLINIC_OR_DEPARTMENT_OTHER): Payer: Medicare HMO | Admitting: Oncology

## 2014-04-12 ENCOUNTER — Encounter (HOSPITAL_COMMUNITY): Payer: Self-pay | Admitting: Oncology

## 2014-04-12 VITALS — BP 173/84 | HR 80 | Temp 97.9°F | Resp 18 | Wt 109.3 lb

## 2014-04-12 DIAGNOSIS — D500 Iron deficiency anemia secondary to blood loss (chronic): Secondary | ICD-10-CM

## 2014-04-12 DIAGNOSIS — D509 Iron deficiency anemia, unspecified: Secondary | ICD-10-CM

## 2014-04-12 DIAGNOSIS — E538 Deficiency of other specified B group vitamins: Secondary | ICD-10-CM

## 2014-04-12 NOTE — Assessment & Plan Note (Signed)
Iron deficiency anemia secondary to GI blood loss requiring periodic IV iron, last administered on 10/12/2013 and 10/19/2013.  Now with ferritin at 56 and normal hemoglobin.  Iron deficit is approximately 300 mg.  Therefore, I will get her scheduled for 1 dose of IV Ferhame 510 mg (ferric gluconate is listed as an allergy).  Will repeat labs in 3 months and 6 months: CBC diff, iron/TIBC, ferritin.  Return in 6 months for follow-up.

## 2014-04-12 NOTE — Patient Instructions (Signed)
[place] at [facility]  Discharge Instructions:  You saw [name] [name] today.  IV iron in 3 weeks.  Labs in 3 months and 6 months.  Return to see Dr Whitney Muse in 6 months.  Please call the clinic if you have any questions or concerns before then. _______________________________________________________________  Thank you for choosing [facility] [place] at [facility] to provide your oncology and hematology care.  To afford each patient quality time with our providers, please arrive at least 15 minutes before your scheduled appointment.  You need to re-schedule your appointment if you arrive 10 or more minutes late.  We strive to give you quality time with our providers, and arriving late affects you and other patients whose appointments are after yours.  Also, if you no show three or more times for appointments you may be dismissed from the clinic.  Again, thank you for choosing [facility] at [facility] [place] hope is that these requests will allow you access to exceptional care and in a timely manner. _______________________________________________________________  If you have questions after your visit, please contact our office at [phone] between the hours of 8:30 a.m. and 5:00 p.m. Voicemails left after 4:30 p.m. will not be returned until the following business day. _______________________________________________________________  For prescription refill requests, have your pharmacy contact our office. _______________________________________________________________  Recommendations made by the consultant and any test results will be sent to your referring physician. _______________________________________________________________

## 2014-04-12 NOTE — Progress Notes (Signed)
[name] [name], [place], [address] [place] [address]  Iron deficiency anemia - Plan: CBC with Differential, Ferritin, Iron and TIBC, ferumoxytol (FERAHEME) 510 mg in sodium chloride 0.9 % 100 mL IVPB  B12 deficiency  CURRENT THERAPY: Monitor labs including iron studies  INTERVAL HISTORY: [name] [name] 81 y.o. female returns for followup of Iron deficiency anemia secondary to GI blood loss requiring periodic IV iron, last administered on 10/12/2013 and 10/19/2013.  I personally reviewed and went over laboratory results with the patient.  The results are noted within this dictation.  On calculation, her iron deficit is about 300 mg.  She is allergic to ferric gluconate and therefore, I will opt for 510 mg of IV Feraheme.  She requests having this administered in 3 weeks.  Her WBC is elevated and that is secondary to prednisone use that she recently was given for an acute issue.  Hematologically, she denies any complaints and ROS questioning is negative.  Past Medical History  Diagnosis Date  . Thyroid disease     hypothyroid  . Cataract     bilateral  . Anemia     iron def  . Iron deficiency anemia 08/10/2010  . B12 deficiency 08/10/2010  . Hypertension     has Iron deficiency anemia; B12 deficiency; and Eosinophilia on her problem list.     is allergic to fergon; ampicillin; cephalexin; codeine; famotidine; nizatidine; tagamet; and salmon.  [name] [name] had no medications administered during this visit.  Past Surgical History  Procedure Laterality Date  . Vagotomy  1971  . Abdominal hysterectomy  1975  . Appendectomy  1955  . Cholecystectomy  1978  . Esophageal dilation  2001  . Esophageal dilation  2004  . Vagotomy      Denies any headaches, dizziness, double vision, fevers, chills, night sweats, nausea, vomiting, diarrhea, constipation, chest pain, heart palpitations, shortness of breath, blood in stool, black tarry stool, urinary pain, urinary  burning, urinary frequency, hematuria.   PHYSICAL EXAMINATION  ECOG PERFORMANCE STATUS: 0 - Asymptomatic  Filed Vitals:   04/12/14 1000  BP: 173/84  Pulse: 80  Temp: 97.9 F (36.6 C)  Resp: 18    GENERAL:alert, no distress, well nourished, well developed, comfortable, cooperative and smiling SKIN: skin color, texture, turgor are normal, no rashes or significant lesions HEAD: Normocephalic, No masses, lesions, tenderness or abnormalities EYES: normal, PERRLA, EOMI, Conjunctiva are pink and non-injected EARS: External ears normal OROPHARYNX:lips, buccal mucosa, and tongue normal and mucous membranes are moist  NECK: supple, no adenopathy, thyroid normal size, non-tender, without nodularity, no stridor, non-tender, trachea midline LYMPH:  no palpable lymphadenopathy, no hepatosplenomegaly BREAST:not examined LUNGS: clear to auscultation and percussion HEART: regular rate & rhythm, no murmurs, no gallops, S1 normal and S2 normal ABDOMEN:abdomen soft, non-tender, normal bowel sounds and no masses or organomegaly BACK: Back symmetric, no curvature. EXTREMITIES:less then 2 second capillary refill, no joint deformities, effusion, or inflammation, no edema, no skin discoloration, no clubbing, no cyanosis  NEURO: alert & oriented x 3 with fluent speech, no focal motor/sensory deficits, gait normal   LABORATORY DATA: CBC    Component Value Date/Time   WBC 14.5* 04/11/2014 0920   RBC 4.12 04/11/2014 0920   HGB 13.6 04/11/2014 0920   HCT 40.7 04/11/2014 0920   PLT 335 04/11/2014 0920   MCV 98.8 04/11/2014 0920   MCH 33.0 04/11/2014 0920   MCHC 33.4 04/11/2014 0920   RDW 12.6 04/11/2014 0920  LYMPHSABS 1.4 04/11/2014 0920   MONOABS 0.7 04/11/2014 0920   EOSABS 0.0 04/11/2014 0920   BASOSABS 0.0 04/11/2014 0920      Chemistry      Component Value Date/Time   NA 130* 10/11/2011 1900   K 3.2* 10/11/2011 1900   CL 94* 10/11/2011 1900   CO2 23 10/11/2011 1900   BUN 12  10/11/2011 1900   CREATININE 0.61 10/11/2011 1900      Component Value Date/Time   CALCIUM 8.8 10/11/2011 1900   ALKPHOS 99 10/11/2011 1900   AST 19 10/11/2011 1900   ALT 12 10/11/2011 1900   BILITOT 0.3 10/11/2011 1900     Lab Results  Component Value Date   IRON 83 02/01/2013   TIBC 292 02/01/2013   FERRITIN 57 04/11/2014    ASSESSMENT AND PLAN:  Iron deficiency anemia Iron deficiency anemia secondary to GI blood loss requiring periodic IV iron, last administered on 10/12/2013 and 10/19/2013.  Now with ferritin at 56 and normal hemoglobin.  Iron deficit is approximately 300 mg.  Therefore, I will get her scheduled for 1 dose of IV Ferhame 510 mg (ferric gluconate is listed as an allergy).  Will repeat labs in 3 months and 6 months: CBC diff, iron/TIBC, ferritin.  Return in 6 months for follow-up.   B12 deficiency Continue PO B12 daily.    THERAPY PLAN:  We will continue to monitor counts and support her iron deficiency.  All questions were answered. The patient knows to call the clinic with any problems, questions or concerns. We can certainly see the patient much sooner if necessary.  Patient and plan discussed with Dr. Ancil Linsey and she is in agreement with the aforementioned.   This note is electronically signed by: Robynn Pane 04/12/2014 10:53 AM

## 2014-04-12 NOTE — Assessment & Plan Note (Signed)
Continue PO B12 daily.

## 2014-04-13 ENCOUNTER — Ambulatory Visit (INDEPENDENT_AMBULATORY_CARE_PROVIDER_SITE_OTHER): Payer: Medicare HMO | Admitting: Neurology

## 2014-04-13 ENCOUNTER — Encounter: Payer: Self-pay | Admitting: Neurology

## 2014-04-13 VITALS — BP 174/80 | HR 86 | Ht 62.0 in | Wt 107.8 lb

## 2014-04-13 DIAGNOSIS — E538 Deficiency of other specified B group vitamins: Secondary | ICD-10-CM | POA: Diagnosis not present

## 2014-04-13 DIAGNOSIS — R269 Unspecified abnormalities of gait and mobility: Secondary | ICD-10-CM

## 2014-04-13 HISTORY — DX: Unspecified abnormalities of gait and mobility: R26.9

## 2014-04-13 NOTE — Patient Instructions (Signed)

## 2014-04-13 NOTE — Progress Notes (Signed)
Reason for visit: Gait disorder  Referring physician: Dr. Janeice Robinson  [name] [name] is a 81 y.o. female  History of present illness:  [name] [name] is an 81 year old right-handed white female with a history of a mild gait disorder that dates back at least 5 years. The patient has had a very gradual slow change in her ability to ambulate. She has not reported any dizziness or any issues with falls, but she feels somewhat unsteady on her feet. She will shuffle her feet some, she denies any tremors. She denies numbness in the feet or hands. He has not had weakness of the extremities. The patient does have some issues with bladder control at times. She does have some low back pain, and some pain into the left hip. She denies any pain radiating down the legs and she denies any neck pain or pain down the arms. She has recently been placed on prednisone for what is thought to be a rotator cuff problem. She denies headaches, or memory disturbance. She has a history of vitamin B12 deficiency, but she has had recent blood work that included a vitamin B12 level that was in the 600 range, and a thyroid profile was unremarkable. The patient is sent to this office for further evaluation of the gait disturbance.  Past Medical History  Diagnosis Date  . Thyroid disease     hypothyroid  . Cataract     bilateral  . Anemia     iron def  . Iron deficiency anemia 08/10/2010  . B12 deficiency 08/10/2010  . Hypertension   . Gait disorder 04/13/2014    Past Surgical History  Procedure Laterality Date  . Vagotomy  1971  . Abdominal hysterectomy  1975  . Appendectomy  1955  . Cholecystectomy  1978  . Esophageal dilation  2001  . Esophageal dilation  2004  . Vagotomy      Family History  Problem Relation Age of Onset  . Cancer Sister   . Hypertension Brother   . Heart disease Father     Social history:  reports that she has never smoked. She has never used smokeless tobacco. She reports that she  drinks about 4.2 oz of alcohol per week. She reports that she does not use illicit drugs.  Medications:  Prior to Admission medications   Medication Sig Start Date End Date Taking? Authorizing Provider  acetaminophen (TYLENOL) 325 MG tablet Take 650 mg by mouth every 6 (six) hours as needed. For aches/pains   Yes Historical Provider, MD  Ascorbic Acid (VITAMIN C) 500 MG CAPS Take 500 mg by mouth 4 (four) times daily.     Yes Historical Provider, MD  b complex vitamins capsule Take 1 capsule by mouth daily.   Yes Historical Provider, MD  calcitonin, salmon, (MIACALCIN/FORTICAL) 200 UNIT/ACT nasal spray Place 1 spray into the nose daily.     Yes Historical Provider, MD  Calcium Carbonate-Vitamin D (CALCIUM 500 + D PO) Take by mouth. 3 tablets daily   Yes Historical Provider, MD  diphenhydrAMINE (BENADRYL) 25 mg capsule Take 25 mg by mouth at bedtime as needed. For allergies   Yes Historical Provider, MD  estradiol (ESTRACE) 1 MG tablet Take 1 mg by mouth daily.   Yes Historical Provider, MD  levothyroxine (SYNTHROID, LEVOTHROID) 112 MCG tablet Take 112 mcg by mouth daily before breakfast.   Yes Historical Provider, MD  loperamide (IMODIUM) 2 MG capsule Take 4 mg by mouth 4 (four) times daily as  needed.   Yes Historical Provider, MD  losartan (COZAAR) 25 MG tablet Take 25 mg by mouth daily. Takes 1/2 tablet daily    Yes Historical Provider, MD  omeprazole (PRILOSEC) 20 MG capsule Take 20 mg by mouth 2 (two) times daily.  05/20/12  Yes Historical Provider, MD  predniSONE (DELTASONE) 10 MG tablet  04/04/14  Yes Historical Provider, MD      Allergies  Allergen Reactions  . Fergon [Ferrous Gluconate] Nausea And Vomiting and Other (See Comments)    Back pain  . Ampicillin Other (See Comments)    Jumpy legs  . Cephalexin     Jumpy legs  . Codeine Nausea Only  . Famotidine Other (See Comments)    jittery  . Nizatidine Other (See Comments)    jittery  . Tagamet [Cimetidine] Other (See Comments)     jittery  . Salmon [Fish Allergy] Itching and Rash    ROS:  Out of a complete 14 system review of symptoms, the patient complains only of the following symptoms, and all other reviewed systems are negative.  Anemia Feeling hot Joint pain Urinary incontinence Runny nose Restless legs  Blood pressure 174/80, pulse 86, height 5\' 2"  (1.575 m), weight 107 lb 12.8 oz (48.898 kg).  Physical Exam  General: The patient is alert and cooperative at the time of the examination.  Eyes: Pupils are equal, round, and reactive to light. Discs are flat bilaterally.  Neck: The neck is supple, no carotid bruits are noted.  Respiratory: The respiratory examination is clear.  Cardiovascular: The cardiovascular examination reveals a regular rate and rhythm, no obvious murmurs or rubs are noted.  Skin: Extremities are with 1+ edema at the ankles bilaterally.  Neurologic Exam  Mental status: The patient is alert and oriented x 3 at the time of the examination. The patient has apparent normal recent and remote memory, with an apparently normal attention span and concentration ability.  Cranial nerves: Facial symmetry is present. There is good sensation of the face to pinprick and soft touch bilaterally. The strength of the facial muscles and the muscles to head turning and shoulder shrug are normal bilaterally. Speech is well enunciated, no aphasia or dysarthria is noted. Extraocular movements are full. Visual fields are full. The tongue is midline, and the patient has symmetric elevation of the soft palate. No obvious hearing deficits are noted.  Motor: The motor testing reveals 5 over 5 strength of all 4 extremities. Good symmetric motor tone is noted throughout.  Sensory: Sensory testing is intact to pinprick, soft touch, vibration sensation, and position sense on all 4 extremities with exception that there is a slight decrease in position sense in both feet. No evidence of extinction is  noted.  Coordination: Cerebellar testing reveals good finger-nose-finger and heel-to-shin bilaterally.  Gait and station: Gait is minimally wide-based, relatively good stability with walking, good arm swing bilaterally. Tandem gait is slightly unsteady. Romberg is negative. No drift is seen.  Reflexes: Deep tendon reflexes are symmetric and normal bilaterally, with exception that the ankle jerk reflexes are depressed bilaterally. Toes are downgoing bilaterally.   Assessment/Plan:  1. Gait disturbance  2. History of vitamin B12 deficiency  Clinical examination shows a minimal gait problem, no evidence of parkinsonism. The patient has mild impairment of position sense in both feet, this could be a residual from the B12 deficiency. The patient will be set up for MRI evaluation of the brain. If this is unremarkable, we will consider nerve conductions of both  legs and EMG of one leg. We may consider further blood work to include a copper level. She will follow-up if needed. The patient could benefit from engaging in activities such as yoga or Pilates that may improve balance issues.  Jill Alexanders MD 04/13/2014 10:17 AM  [facility] [address] [place], [place] [address]  Phone [phone] Fax [phone]

## 2014-04-27 ENCOUNTER — Ambulatory Visit
Admission: RE | Admit: 2014-04-27 | Discharge: 2014-04-27 | Disposition: A | Discharge status: Home / Self Care | Payer: Medicare HMO | Source: Ambulatory Visit | Attending: Neurology | Admitting: Neurology

## 2014-04-27 DIAGNOSIS — R269 Unspecified abnormalities of gait and mobility: Secondary | ICD-10-CM | POA: Diagnosis not present

## 2014-04-27 DIAGNOSIS — E538 Deficiency of other specified B group vitamins: Secondary | ICD-10-CM

## 2014-04-28 ENCOUNTER — Telehealth: Payer: Self-pay | Admitting: Neurology

## 2014-04-28 DIAGNOSIS — R269 Unspecified abnormalities of gait and mobility: Secondary | ICD-10-CM

## 2014-04-28 NOTE — Telephone Encounter (Signed)
I called the patient, talk with the husband. MRI does not show significant small vessel disease. Some atrophy is seen. The patient does have some position sense impairment in the feet, I will get nerve conduction studies look for a neuropathy issue.  MRI brain 04/27/14:  IMPRESSION:  Abnormal MRI brain (without) demonstrating: 1. Mild diffuse, moderate perisylvian, moderate corpus callosum and severe hippocampal atrophy. Moderate ventriculomegaly on ex vacuo basis.[name] 2. Mild chronic small vessel ischemic disease.  3. No acute findings.

## 2014-05-03 ENCOUNTER — Ambulatory Visit (HOSPITAL_COMMUNITY): Payer: Medicare HMO

## 2014-05-04 ENCOUNTER — Encounter (HOSPITAL_BASED_OUTPATIENT_CLINIC_OR_DEPARTMENT_OTHER): Payer: Medicare HMO

## 2014-05-04 ENCOUNTER — Encounter (HOSPITAL_COMMUNITY): Payer: Self-pay

## 2014-05-04 VITALS — BP 171/63 | HR 71 | Temp 97.9°F | Resp 18

## 2014-05-04 DIAGNOSIS — D509 Iron deficiency anemia, unspecified: Secondary | ICD-10-CM | POA: Diagnosis not present

## 2014-05-04 MED ORDER — SODIUM CHLORIDE 0.9 % IV SOLN
510.0000 mg | Freq: Once | INTRAVENOUS | Status: AC
  Administered 2014-05-04: 510 mg via INTRAVENOUS
  Filled 2014-05-04: qty 17

## 2014-05-04 MED ORDER — SODIUM CHLORIDE 0.9 % IV SOLN
INTRAVENOUS | Status: DC
  Administered 2014-05-04: 14:00:00 via INTRAVENOUS

## 2014-05-04 NOTE — Patient Instructions (Signed)
[place] at [facility] Discharge Instructions  RECOMMENDATIONS MADE BY THE CONSULTANT AND ANY TEST RESULTS WILL BE SENT TO YOUR REFERRING PHYSICIAN.  Today you received Feraheme (iron) infusion. Return as scheduled for lab work and office visit. Please call the clinic should you have any questions or concerns prior to your scheduled appointments.  Thank you for choosing [place] at [facility] to provide your oncology and hematology care.  To afford each patient quality time with our provider, please arrive at least 15 minutes before your scheduled appointment time.    You need to re-schedule your appointment should you arrive 10 or more minutes late.  We strive to give you quality time with our providers, and arriving late affects you and other patients whose appointments are after yours.  Also, if you no show three or more times for appointments you may be dismissed from the clinic at the providers discretion.     Again, thank you for choosing [facility].  Our hope is that these requests will decrease the amount of time that you wait before being seen by our physicians.       _____________________________________________________________  Should you have questions after your visit to [facility], please contact our office at [phone] between the hours of 8:30 a.m. and 4:30 p.m.  Voicemails left after 4:30 p.m. will not be returned until the following business day.  For prescription refill requests, have your pharmacy contact our office.

## 2014-05-05 ENCOUNTER — Ambulatory Visit (INDEPENDENT_AMBULATORY_CARE_PROVIDER_SITE_OTHER): Payer: Medicare HMO | Admitting: Neurology

## 2014-05-05 ENCOUNTER — Ambulatory Visit (INDEPENDENT_AMBULATORY_CARE_PROVIDER_SITE_OTHER): Payer: Self-pay | Admitting: Neurology

## 2014-05-05 DIAGNOSIS — R2689 Other abnormalities of gait and mobility: Secondary | ICD-10-CM | POA: Diagnosis not present

## 2014-05-05 DIAGNOSIS — R2681 Unsteadiness on feet: Secondary | ICD-10-CM

## 2014-05-05 DIAGNOSIS — R269 Unspecified abnormalities of gait and mobility: Secondary | ICD-10-CM

## 2014-05-05 DIAGNOSIS — E538 Deficiency of other specified B group vitamins: Secondary | ICD-10-CM

## 2014-05-05 NOTE — Progress Notes (Signed)
Ms. Comley comes in today for EMG and nerve conduction study evaluation. The nerve conduction study suggests bilateral peroneal nerve dysfunction, but EMG evaluation on the right leg suggest that the peroneal nerve dysfunction is not as severe as the nerve conduction study would suggest, and the problems may be more distal.  The patient has undergone MRI evaluation of the brain that was relatively unremarkable. She has a history of prior low B12 levels. At this point, we will send the patient for physical therapy for gait training. The patient will follow-up in 3 or 4 months.

## 2014-05-05 NOTE — Procedures (Signed)
     HISTORY:  [name] [name] is an 81 year old patient with a change in her ability to walk, she is shuffling her feet, with some gait instability. The patient is being evaluated for a possible peripheral neuropathy as An etiology of her walking issues. She has a prior history of a vitamin B12 deficiency.  NERVE CONDUCTION STUDIES:  Nerve conduction studies were performed on both lower extremities. The distal motor latencies for the peroneal and posterior tibial nerves were within normal limits bilaterally, with low motor amplitudes for the peroneal nerves bilaterally, normal for the posterior tibial nerves bilaterally. The nerve conduction velocities for the peroneal and posterior tibial nerves were within normal limits bilaterally. The H reflex latencies were symmetric and normal, and the sural sensory latencies were symmetric and normal. The peroneal sensory latencies were unobtainable bilaterally.  EMG STUDIES:  EMG study was performed on the right lower extremity:  The tibialis anterior muscle reveals 2 to 4K motor units with full recruitment. No fibrillations or positive waves were seen. The peroneus tertius muscle reveals 2 to 4K motor units with full recruitment. No fibrillations or positive waves were seen. The medial gastrocnemius muscle reveals 1 to 3K motor units with full recruitment. No fibrillations or positive waves were seen. The vastus lateralis muscle reveals 2 to 4K motor units with full recruitment. No fibrillations or positive waves were seen. The iliopsoas muscle reveals 2 to 4K motor units with full recruitment. No fibrillations or positive waves were seen. The biceps femoris muscle (long head) reveals 2 to 4K motor units with full recruitment. No fibrillations or positive waves were seen. The lumbosacral paraspinal muscles were tested at 3 levels, and revealed no abnormalities of insertional activity at all 3 levels tested. There was good  relaxation.   IMPRESSION:  Nerve conduction studies done on both lower extremities shows evidence of bilateral peroneal nerve dysfunction, no clear evidence of an actual peripheral neuropathy. EMG evaluation of the right lower extremity is unremarkable, without evidence of an overlying lumbosacral radiculopathy. The EMG study suggests that the peroneal nerve dysfunction is not as severe as the nerve conduction study would suggest, and may be primarily distal.  C. Floyde Parkins MD 05/05/2014 1:27 PM  [facility] [address] [place], [place] [address]  Phone [phone] Fax [phone]

## 2014-05-07 ENCOUNTER — Encounter: Payer: Self-pay | Admitting: Neurology

## 2014-05-07 NOTE — Progress Notes (Signed)
Please refer to EMG and NCV procedure note. 

## 2014-05-24 ENCOUNTER — Encounter (HOSPITAL_COMMUNITY): Payer: Self-pay | Admitting: Emergency Medicine

## 2014-05-24 ENCOUNTER — Emergency Department (HOSPITAL_COMMUNITY)
Admission: EM | Admit: 2014-05-24 | Discharge: 2014-05-24 | Disposition: A | Discharge status: Home / Self Care | Payer: Medicare HMO | Attending: Emergency Medicine | Admitting: Emergency Medicine

## 2014-05-24 ENCOUNTER — Emergency Department (HOSPITAL_COMMUNITY): Payer: Medicare HMO

## 2014-05-24 DIAGNOSIS — D649 Anemia, unspecified: Secondary | ICD-10-CM | POA: Insufficient documentation

## 2014-05-24 DIAGNOSIS — W01198A Fall on same level from slipping, tripping and stumbling with subsequent striking against other object, initial encounter: Secondary | ICD-10-CM | POA: Insufficient documentation

## 2014-05-24 DIAGNOSIS — Y9389 Activity, other specified: Secondary | ICD-10-CM | POA: Insufficient documentation

## 2014-05-24 DIAGNOSIS — Z79899 Other long term (current) drug therapy: Secondary | ICD-10-CM | POA: Diagnosis not present

## 2014-05-24 DIAGNOSIS — Z880 Allergy status to penicillin: Secondary | ICD-10-CM | POA: Diagnosis not present

## 2014-05-24 DIAGNOSIS — S4991XA Unspecified injury of right shoulder and upper arm, initial encounter: Secondary | ICD-10-CM | POA: Diagnosis present

## 2014-05-24 DIAGNOSIS — M25511 Pain in right shoulder: Secondary | ICD-10-CM

## 2014-05-24 DIAGNOSIS — S0511XA Contusion of eyeball and orbital tissues, right eye, initial encounter: Secondary | ICD-10-CM | POA: Insufficient documentation

## 2014-05-24 DIAGNOSIS — Z87720 Personal history of (corrected) congenital malformations of eye: Secondary | ICD-10-CM | POA: Diagnosis not present

## 2014-05-24 DIAGNOSIS — Y9289 Other specified places as the place of occurrence of the external cause: Secondary | ICD-10-CM | POA: Diagnosis not present

## 2014-05-24 DIAGNOSIS — E039 Hypothyroidism, unspecified: Secondary | ICD-10-CM | POA: Insufficient documentation

## 2014-05-24 DIAGNOSIS — R51 Headache: Secondary | ICD-10-CM

## 2014-05-24 DIAGNOSIS — Y998 Other external cause status: Secondary | ICD-10-CM | POA: Insufficient documentation

## 2014-05-24 DIAGNOSIS — I10 Essential (primary) hypertension: Secondary | ICD-10-CM | POA: Diagnosis not present

## 2014-05-24 DIAGNOSIS — S0990XA Unspecified injury of head, initial encounter: Secondary | ICD-10-CM

## 2014-05-24 DIAGNOSIS — R519 Headache, unspecified: Secondary | ICD-10-CM

## 2014-05-24 DIAGNOSIS — D519 Vitamin B12 deficiency anemia, unspecified: Secondary | ICD-10-CM | POA: Diagnosis not present

## 2014-05-24 DIAGNOSIS — W19XXXA Unspecified fall, initial encounter: Secondary | ICD-10-CM

## 2014-05-24 DIAGNOSIS — S0011XA Contusion of right eyelid and periocular area, initial encounter: Secondary | ICD-10-CM

## 2014-05-24 LAB — CBC
HCT: 40 % (ref 36.0–46.0)
Hemoglobin: 13.3 g/dL (ref 12.0–15.0)
MCH: 32.1 pg (ref 26.0–34.0)
MCHC: 33.3 g/dL (ref 30.0–36.0)
MCV: 96.6 fL (ref 78.0–100.0)
PLATELETS: 266 10*3/uL (ref 150–400)
RBC: 4.14 MIL/uL (ref 3.87–5.11)
RDW: 13.1 % (ref 11.5–15.5)
WBC: 9 10*3/uL (ref 4.0–10.5)

## 2014-05-24 LAB — BASIC METABOLIC PANEL
ANION GAP: 11 (ref 5–15)
BUN: 11 mg/dL (ref 6–23)
CHLORIDE: 102 mmol/L (ref 96–112)
CO2: 23 mmol/L (ref 19–32)
Calcium: 8.7 mg/dL (ref 8.4–10.5)
Creatinine, Ser: 0.77 mg/dL (ref 0.50–1.10)
GFR calc Af Amer: 89 mL/min — ABNORMAL LOW (ref >90)
GFR calc non Af Amer: 77 mL/min — ABNORMAL LOW (ref >90)
Glucose, Bld: 100 mg/dL — ABNORMAL HIGH (ref 70–99)
Potassium: 3.9 mmol/L (ref 3.5–5.1)
SODIUM: 136 mmol/L (ref 135–145)

## 2014-05-24 LAB — URINALYSIS, ROUTINE W REFLEX MICROSCOPIC
BILIRUBIN URINE: NEGATIVE
GLUCOSE, UA: NEGATIVE mg/dL
KETONES UR: NEGATIVE mg/dL
Leukocytes, UA: NEGATIVE
Nitrite: NEGATIVE
PH: 6.5 (ref 5.0–8.0)
PROTEIN: NEGATIVE mg/dL
Specific Gravity, Urine: 1.005 (ref 1.005–1.030)
Urobilinogen, UA: 0.2 mg/dL (ref 0.0–1.0)

## 2014-05-24 LAB — I-STAT TROPONIN, ED: Troponin i, poc: 0 ng/mL (ref 0.00–0.08)

## 2014-05-24 LAB — URINE MICROSCOPIC-ADD ON

## 2014-05-24 MED ORDER — FUROSEMIDE 10 MG/ML IJ SOLN: 40.0000 mg | INTRAMUSCULAR | Status: DC

## 2014-05-24 MED ORDER — SODIUM CHLORIDE 0.9 % IV BOLUS (SEPSIS)
500.0000 mL | Freq: Once | INTRAVENOUS | Status: AC
  Administered 2014-05-24: 500 mL via INTRAVENOUS

## 2014-05-24 MED ORDER — ACETAMINOPHEN 325 MG PO TABS
650.0000 mg | ORAL_TABLET | Freq: Once | ORAL | Status: AC
  Administered 2014-05-24: 650 mg via ORAL
  Filled 2014-05-24: qty 2

## 2014-05-24 NOTE — Discharge Instructions (Signed)
Return to the emergency room with worsening of symptoms, new symptoms or with symptoms that are concerning , especially severe worsening of headache, visual or speech changes, weakness in face, arms or legs. Please call your doctor for a followup appointment within 24-48 hours. When you talk to your doctor please let them know that you were seen in the emergency department and have them acquire all of your records so that they can discuss the findings with you and formulate a treatment plan to fully care for your new and ongoing problems.  Read below information and follow recommendations. Head Injury You have received a head injury. It does not appear serious at this time. Headaches and vomiting are common following head injury. It should be easy to awaken from sleeping. Sometimes it is necessary for you to stay in the emergency department for a while for observation. Sometimes admission to the hospital may be needed. After injuries such as yours, most problems occur within the first 24 hours, but side effects may occur up to 7-10 days after the injury. It is important for you to carefully monitor your condition and contact your health care provider or seek immediate medical care if there is a change in your condition. WHAT ARE THE TYPES OF HEAD INJURIES? Head injuries can be as minor as a bump. Some head injuries can be more severe. More severe head injuries include:  A jarring injury to the brain (concussion).  A bruise of the brain (contusion). This mean there is bleeding in the brain that can cause swelling.  A cracked skull (skull fracture).  Bleeding in the brain that collects, clots, and forms a bump (hematoma). WHAT CAUSES A HEAD INJURY? A serious head injury is most likely to happen to someone who is in a car wreck and is not wearing a seat belt. Other causes of major head injuries include bicycle or motorcycle accidents, sports injuries, and falls. HOW ARE HEAD INJURIES DIAGNOSED? A  complete history of the event leading to the injury and your current symptoms will be helpful in diagnosing head injuries. Many times, pictures of the brain, such as CT or MRI are needed to see the extent of the injury. Often, an overnight hospital stay is necessary for observation.  WHEN SHOULD I SEEK IMMEDIATE MEDICAL CARE?  You should get help right away if:  You have confusion or drowsiness.  You feel sick to your stomach (nauseous) or have continued, forceful vomiting.  You have dizziness or unsteadiness that is getting worse.  You have severe, continued headaches not relieved by medicine. Only take over-the-counter or prescription medicines for pain, fever, or discomfort as directed by your health care provider.  You do not have normal function of the arms or legs or are unable to walk.  You notice changes in the black spots in the center of the colored part of your eye (pupil).  You have a clear or bloody fluid coming from your nose or ears.  You have a loss of vision. During the next 24 hours after the injury, you must stay with someone who can watch you for the warning signs. This person should contact local emergency services (911 in the [place] if you have seizures, you become unconscious, or you are unable to wake up. HOW CAN I PREVENT A HEAD INJURY IN THE FUTURE? The most important factor for preventing major head injuries is avoiding motor vehicle accidents. To minimize the potential for damage to your head, it is crucial to wear seat  belts while riding in motor vehicles. Wearing helmets while bike riding and playing collision sports (like football) is also helpful. Also, avoiding dangerous activities around the house will further help reduce your risk of head injury.  WHEN CAN I RETURN TO NORMAL ACTIVITIES AND ATHLETICS? You should be reevaluated by your health care provider before returning to these activities. If you have any of the following symptoms, you should not return to  activities or contact sports until 1 week after the symptoms have stopped:  Persistent headache.  Dizziness or vertigo.  Poor attention and concentration.  Confusion.  Memory problems.  Nausea or vomiting.  Fatigue or tire easily.  Irritability.  Intolerant of bright lights or loud noises.  Anxiety or depression.  Disturbed sleep. MAKE SURE YOU:   Understand these instructions.  Will watch your condition.  Will get help right away if you are not doing well or get worse. Document Released: 01/20/2005 Document Revised: 01/25/2013 Document Reviewed: 09/27/2012 [facility] Patient Information 2015 [place], [place]. This information is not intended to replace advice given to you by your health care provider. Make sure you discuss any questions you have with your health care provider.

## 2014-05-24 NOTE — ED Provider Notes (Signed)
CSN: [id number]     Arrival date & time 05/24/14  1656 History   First MD Initiated Contact with Patient 05/24/14 1750     Chief Complaint  Patient presents with  . Fall     (Consider location/radiation/quality/duration/timing/severity/associated sxs/prior Treatment) HPI  [name] [name] is a 81 y.o. female with PMH of thyroid, bilateral cataract, anemia, hypertension presenting with fall last night. Patient reports bending over to pick Something and fell landing on her right forehead. Patient unsure she lost consciousness. Patient endorses headache that developed gradually and states it is less severe than her normal headaches. She denies any visual changes, slurred speech, weakness. Patient took Tylenol for her headache and improved her symptoms. Pain is over hematoma. Patient also with bruising surrounding right eye and forehead. She is not on any blood thinners. No chest pain or shortness of breath. No nausea, vomiting, abdominal pain. Patient endorses mild neck pain. She denies any lightheadedness or dizziness.   Past Medical History  Diagnosis Date  . Thyroid disease     hypothyroid  . Cataract     bilateral  . Anemia     iron def  . Iron deficiency anemia 08/10/2010  . B12 deficiency 08/10/2010  . Hypertension   . Gait disorder 04/13/2014   Past Surgical History  Procedure Laterality Date  . Vagotomy  1971  . Abdominal hysterectomy  1975  . Appendectomy  1955  . Cholecystectomy  1978  . Esophageal dilation  2001  . Esophageal dilation  2004  . Vagotomy     Family History  Problem Relation Age of Onset  . Cancer Sister   . Hypertension Brother   . Heart disease Father    History  Substance Use Topics  . Smoking status: Never Smoker   . Smokeless tobacco: Never Used  . Alcohol Use: 4.2 oz/week    7 Glasses of wine per week     Comment: occ   OB History    No data available     Review of Systems 10 Systems reviewed and are negative for acute change except as  noted in the HPI.    Allergies  Fergon; Ampicillin; Cephalexin; Codeine; Famotidine; Nizatidine; Tagamet; and Salmon  Home Medications   Prior to Admission medications   Medication Sig Start Date End Date Taking? Authorizing Provider  acetaminophen (TYLENOL) 325 MG tablet Take 650 mg by mouth every 6 (six) hours as needed. For aches/pains   Yes Historical Provider, MD  Ascorbic Acid (VITAMIN C) 500 MG CAPS Take 500 mg by mouth 4 (four) times daily.     Yes Historical Provider, MD  b complex vitamins capsule Take 1 capsule by mouth daily.   Yes Historical Provider, MD  Calcium Carbonate-Vitamin D (CALCIUM 500 + D PO) Take by mouth. 3 tablets daily   Yes Historical Provider, MD  diphenhydrAMINE (BENADRYL) 25 mg capsule Take 25 mg by mouth at bedtime as needed. For allergies   Yes Historical Provider, MD  estradiol (ESTRACE) 1 MG tablet Take 1 mg by mouth daily.   Yes Historical Provider, MD  levothyroxine (SYNTHROID, LEVOTHROID) 112 MCG tablet Take 112 mcg by mouth daily before breakfast.   Yes Historical Provider, MD  loperamide (IMODIUM) 2 MG capsule Take 4 mg by mouth 4 (four) times daily as needed for diarrhea or loose stools.    Yes Historical Provider, MD  losartan (COZAAR) 25 MG tablet Take 25 mg by mouth daily. Takes 1/2 tablet daily    Yes Historical Provider,  MD  omeprazole (PRILOSEC) 20 MG capsule Take 20 mg by mouth 2 (two) times daily.  05/20/12  Yes Historical Provider, MD   BP 167/70 mmHg  Pulse 69  Temp(Src) 97.8 F (36.6 C) (Oral)  Resp 17  Ht 5\' 2"  (1.575 m)  Wt 108 lb (48.988 kg)  BMI 19.75 kg/m2  SpO2 99% Physical Exam  Constitutional: She appears well-developed and well-nourished. No distress.  HENT:  Head: Normocephalic.  Mouth/Throat: Oropharynx is clear and moist.  2-3 cm hematoma to right lateral temple with surrounding ecchymoses. Ecchymoses to circumferential right eyelids. No hemotympanum, no malocclusion, Battle sign  Eyes: Conjunctivae and EOM are  normal. Pupils are equal, round, and reactive to light. Right eye exhibits no discharge. Left eye exhibits no discharge.  EOM intact without paralysis or pain  Neck: Normal range of motion. Neck supple.  No nuchal rigidity. Right sided neck tenderness with mild midline neck tenderness  Cardiovascular: Normal rate and regular rhythm.   Pulmonary/Chest: Effort normal and breath sounds normal. No respiratory distress. She has no wheezes.  Abdominal: Soft. Bowel sounds are normal. She exhibits no distension. There is no tenderness.  Neurological: She is alert. No cranial nerve deficit. Coordination normal.  Pt oriented 3. Speech is clear and goal oriented. Peripheral visual fields intact. Strength 5/5 in upper and lower extremities. Sensation intact. Intact rapid alternating movements, finger to nose, and heel to shin.  No pronator drift. Normal, steady gait with cane.   Skin: Skin is warm and dry. She is not diaphoretic.  Nursing note and vitals reviewed.   ED Course  Procedures (including critical care time) Labs Review Labs Reviewed  BASIC METABOLIC PANEL - Abnormal; Notable for the following:    Glucose, Bld 100 (*)    GFR calc non Af Amer 77 (*)    GFR calc Af Amer 89 (*)    All other components within normal limits  URINALYSIS, ROUTINE W REFLEX MICROSCOPIC - Abnormal; Notable for the following:    Hgb urine dipstick TRACE (*)    All other components within normal limits  URINE MICROSCOPIC-ADD ON - Abnormal; Notable for the following:    Squamous Epithelial / LPF FEW (*)    All other components within normal limits  CBC  I-STAT TROPOININ, ED    Imaging Review Dg Chest 2 View  05/24/2014   CLINICAL DATA:  Syncopal episode yesterday with fall, initial encounter  EXAM: CHEST  2 VIEW  COMPARISON:  04/02/2009  FINDINGS: Cardiac shadow is stable. Minimal scarring is noted in the left mid lung. No focal infiltrate or sizable effusion is seen. No acute bony abnormality is noted.   IMPRESSION: No active cardiopulmonary disease.   Electronically Signed   By: Inez Catalina M.D.   On: 05/24/2014 20:56   Dg Shoulder Right  05/24/2014   CLINICAL DATA:  Right shoulder pain following fall 2 days ago, initial encounter  EXAM: RIGHT SHOULDER - 2+ VIEW  COMPARISON:  None.  FINDINGS: There is no evidence of fracture or dislocation. There is no evidence of arthropathy or other focal bone abnormality. Soft tissues are unremarkable.  IMPRESSION: No acute abnormality noted.   Electronically Signed   By: Inez Catalina M.D.   On: 05/24/2014 20:55   Ct Head Wo Contrast  05/24/2014   CLINICAL DATA:  81 year old female status post fall last night with face and forehead pain and swelling  EXAM: CT HEAD WITHOUT CONTRAST  CT MAXILLOFACIAL WITHOUT CONTRAST  CT CERVICAL SPINE WITHOUT CONTRAST  TECHNIQUE: Multidetector CT imaging of the head, cervical spine, and maxillofacial structures were performed using the standard protocol without intravenous contrast. Multiplanar CT image reconstructions of the cervical spine and maxillofacial structures were also generated.  COMPARISON:  Prior brain MRI 04/27/2014  FINDINGS: CT HEAD FINDINGS  Negative for acute intracranial hemorrhage, acute infarction, mass, mass effect, hydrocephalus or midline shift. Gray-white differentiation is preserved throughout. Cerebral and cerebellar cortical atrophy. Mild chronic microvascular ischemic white matter disease. Right frontotemporal scalp hematoma. No evidence of underlying calvarial fracture. Hematoma extends inferiorly over the temporal and right periorbital soft tissues. The globes and orbits remain intact and symmetric bilaterally. Large mucous retention cyst right maxillary sinus. Chronic appearing right mastoid effusion with associated bony sclerosis. Normal aeration of the left mastoid air cells. Chronic opacification of the right sphenoid air cell with internal high attenuation.  CT MAXILLOFACIAL FINDINGS  No evidence of  facial fracture. Soft tissue swelling over the right temporal in right periorbital soft tissue. Globes and orbits are intact and unremarkable. Chronic right sphenoid sinus opacification with internal high attenuation. Chronic partial right mastoid effusion. The left mastoid air cells and remaining paranasal sinuses are largely clear save for a large mucous retention cyst in the inferior right maxillary sinus. No focal bony lesion.  CT CERVICAL SPINE FINDINGS  No acute fracture, malalignment or prevertebral soft tissue swelling. Exaggerated cervical lordosis likely secondary to an exaggerated thoracic kyphosis. Multi level degenerative cervical spondylosis. Bilateral facet arthropathy with partial bony fusion of the posterior elements at C3, C4 and C5. The bones appear osteopenic. The Unremarkable CT appearance of the thyroid gland. No acute soft tissue abnormality. Biapical pleural parenchymal scarring.  IMPRESSION: CT HEAD  1. No acute intracranial abnormality. 2. Right frontotemporal scalp hematoma without evidence of underlying fracture. 3. Cortical atrophy and chronic microvascular ischemic white matter disease. CT FACE  1. Soft tissue swelling in the right temporal and lateral periorbital soft tissues without evidence of underlying fracture. 2. Chronic sphenoid sinusitis. Internal high attenuation may represent chronic fungal sinusitis or inspissated secretions. 3. Chronic partial right mastoid effusion. 4. Right maxillary mucous retention cyst. CT CSPINE  1. No acute fracture or malalignment. 2. Multilevel cervical spondylosis and facet arthropathy with partial bony fusion of the posterior elements from C3 through C5. 3. Nodular biapical pleural parenchymal scarring.   Electronically Signed   By: Jacqulynn Cadet M.D.   On: 05/24/2014 20:42   Ct Cervical Spine Wo Contrast  05/24/2014   CLINICAL DATA:  81 year old female status post fall last night with face and forehead pain and swelling  EXAM: CT HEAD  WITHOUT CONTRAST  CT MAXILLOFACIAL WITHOUT CONTRAST  CT CERVICAL SPINE WITHOUT CONTRAST  TECHNIQUE: Multidetector CT imaging of the head, cervical spine, and maxillofacial structures were performed using the standard protocol without intravenous contrast. Multiplanar CT image reconstructions of the cervical spine and maxillofacial structures were also generated.  COMPARISON:  Prior brain MRI 04/27/2014  FINDINGS: CT HEAD FINDINGS  Negative for acute intracranial hemorrhage, acute infarction, mass, mass effect, hydrocephalus or midline shift. Gray-white differentiation is preserved throughout. Cerebral and cerebellar cortical atrophy. Mild chronic microvascular ischemic white matter disease. Right frontotemporal scalp hematoma. No evidence of underlying calvarial fracture. Hematoma extends inferiorly over the temporal and right periorbital soft tissues. The globes and orbits remain intact and symmetric bilaterally. Large mucous retention cyst right maxillary sinus. Chronic appearing right mastoid effusion with associated bony sclerosis. Normal aeration of the left mastoid air cells. Chronic opacification of the right sphenoid air  cell with internal high attenuation.  CT MAXILLOFACIAL FINDINGS  No evidence of facial fracture. Soft tissue swelling over the right temporal in right periorbital soft tissue. Globes and orbits are intact and unremarkable. Chronic right sphenoid sinus opacification with internal high attenuation. Chronic partial right mastoid effusion. The left mastoid air cells and remaining paranasal sinuses are largely clear save for a large mucous retention cyst in the inferior right maxillary sinus. No focal bony lesion.  CT CERVICAL SPINE FINDINGS  No acute fracture, malalignment or prevertebral soft tissue swelling. Exaggerated cervical lordosis likely secondary to an exaggerated thoracic kyphosis. Multi level degenerative cervical spondylosis. Bilateral facet arthropathy with partial bony fusion of  the posterior elements at C3, C4 and C5. The bones appear osteopenic. The Unremarkable CT appearance of the thyroid gland. No acute soft tissue abnormality. Biapical pleural parenchymal scarring.  IMPRESSION: CT HEAD  1. No acute intracranial abnormality. 2. Right frontotemporal scalp hematoma without evidence of underlying fracture. 3. Cortical atrophy and chronic microvascular ischemic white matter disease. CT FACE  1. Soft tissue swelling in the right temporal and lateral periorbital soft tissues without evidence of underlying fracture. 2. Chronic sphenoid sinusitis. Internal high attenuation may represent chronic fungal sinusitis or inspissated secretions. 3. Chronic partial right mastoid effusion. 4. Right maxillary mucous retention cyst. CT CSPINE  1. No acute fracture or malalignment. 2. Multilevel cervical spondylosis and facet arthropathy with partial bony fusion of the posterior elements from C3 through C5. 3. Nodular biapical pleural parenchymal scarring.   Electronically Signed   By: Jacqulynn Cadet M.D.   On: 05/24/2014 20:42   Ct Maxillofacial Wo Cm  05/24/2014   CLINICAL DATA:  81 year old female status post fall last night with face and forehead pain and swelling  EXAM: CT HEAD WITHOUT CONTRAST  CT MAXILLOFACIAL WITHOUT CONTRAST  CT CERVICAL SPINE WITHOUT CONTRAST  TECHNIQUE: Multidetector CT imaging of the head, cervical spine, and maxillofacial structures were performed using the standard protocol without intravenous contrast. Multiplanar CT image reconstructions of the cervical spine and maxillofacial structures were also generated.  COMPARISON:  Prior brain MRI 04/27/2014  FINDINGS: CT HEAD FINDINGS  Negative for acute intracranial hemorrhage, acute infarction, mass, mass effect, hydrocephalus or midline shift. Gray-white differentiation is preserved throughout. Cerebral and cerebellar cortical atrophy. Mild chronic microvascular ischemic white matter disease. Right frontotemporal scalp  hematoma. No evidence of underlying calvarial fracture. Hematoma extends inferiorly over the temporal and right periorbital soft tissues. The globes and orbits remain intact and symmetric bilaterally. Large mucous retention cyst right maxillary sinus. Chronic appearing right mastoid effusion with associated bony sclerosis. Normal aeration of the left mastoid air cells. Chronic opacification of the right sphenoid air cell with internal high attenuation.  CT MAXILLOFACIAL FINDINGS  No evidence of facial fracture. Soft tissue swelling over the right temporal in right periorbital soft tissue. Globes and orbits are intact and unremarkable. Chronic right sphenoid sinus opacification with internal high attenuation. Chronic partial right mastoid effusion. The left mastoid air cells and remaining paranasal sinuses are largely clear save for a large mucous retention cyst in the inferior right maxillary sinus. No focal bony lesion.  CT CERVICAL SPINE FINDINGS  No acute fracture, malalignment or prevertebral soft tissue swelling. Exaggerated cervical lordosis likely secondary to an exaggerated thoracic kyphosis. Multi level degenerative cervical spondylosis. Bilateral facet arthropathy with partial bony fusion of the posterior elements at C3, C4 and C5. The bones appear osteopenic. The Unremarkable CT appearance of the thyroid gland. No acute soft tissue abnormality. Biapical  pleural parenchymal scarring.  IMPRESSION: CT HEAD  1. No acute intracranial abnormality. 2. Right frontotemporal scalp hematoma without evidence of underlying fracture. 3. Cortical atrophy and chronic microvascular ischemic white matter disease. CT FACE  1. Soft tissue swelling in the right temporal and lateral periorbital soft tissues without evidence of underlying fracture. 2. Chronic sphenoid sinusitis. Internal high attenuation may represent chronic fungal sinusitis or inspissated secretions. 3. Chronic partial right mastoid effusion. 4. Right  maxillary mucous retention cyst. CT CSPINE  1. No acute fracture or malalignment. 2. Multilevel cervical spondylosis and facet arthropathy with partial bony fusion of the posterior elements from C3 through C5. 3. Nodular biapical pleural parenchymal scarring.   Electronically Signed   By: Jacqulynn Cadet M.D.   On: 05/24/2014 20:42     EKG Interpretation   Date/Time:  Wednesday May 24 2014 20:06:02 EDT Ventricular Rate:  65 PR Interval:  172 QRS Duration: 87 QT Interval:  387 QTC Calculation: 402 R Axis:   81 Text Interpretation:  Sinus rhythm Borderline right axis deviation  Baseline wander in lead(s) V3 No old tracing to compare Confirmed by  MILLER  MD, [place] ([id number]) on 05/24/2014 9:14:03 PM      MDM   Final diagnoses:  Right shoulder pain  Head injury, initial encounter  Nonintractable headache  Periorbital ecchymosis, right, initial encounter   Patient presenting with fall and headache yesterday. She will periorbital right ecchymoses. Sharp motion intact without paralysis. She denies any neurological symptoms. Pt states headache is mild and did not gradually. VSS. Neurological exam without deficits. Lab work up reassuring. CT without evidence of infarction, intracranial hemorrhage or acute fracture, or ligamentous injury of neck. Patient with spondylosis of cervical spine which is discussed with patient. I doubt subarachnoid hemorrhage, intracranial hemorrhage, orbital entrapment. Patient states she's had significant improvement of her headache in the ED. She denies any symptoms at this time. Patient nontoxic nonseptic appearing and is stable for discharge.  Discussed return precautions with patient. Discussed all results and patient verbalizes understanding and agrees with plan.  This is a shared patient. This patient was discussed with the physician who saw and evaluated the patient and agrees with the plan.   Al Corpus, PA-C 05/24/14 2147  Noemi Chapel,  MD 05/25/14 [phone]

## 2014-05-24 NOTE — ED Notes (Signed)
Patient transported to CT 

## 2014-05-24 NOTE — ED Notes (Signed)
Pt reports bending over to pick something up last night and fell. Questionable loc. No blood thinners. Pt sent here by pcp for MRI. Pt has significant bruising to R eye and forehead.

## 2014-05-24 NOTE — ED Provider Notes (Signed)
The patient is a pleasant 81 year old female who presents after falling to the ground when she bent over, this was a accidental fall, she has bruising to the right side of her face, tenderness around the orbital rim but no extraocular movement abnormalities, normal pupillary exam, normal cranial nerves, no malocclusion, no hemotympanum, no battle sign, she does have significant periorbital ecchymosis on the right. She has minimal tenderness to the upper cervical spine She has normal strength and sensation in all 4 extremities, follows commands without difficulty, normal speech, normal coordination. Heart and lungs with normal exam, soft abdomen, no peripheral edema, the patient will be sent for CT scan imaging to rule out serious injury,  Medical screening examination/treatment/procedure(s) were conducted as a shared visit with non-physician practitioner(s) and myself.  I personally evaluated the patient during the encounter.  Clinical Impression:   Final diagnoses:  Right shoulder pain  Head injury, initial encounter  Nonintractable headache  Periorbital ecchymosis, right, initial encounter            Noemi Chapel, MD 05/25/14 [phone]

## 2014-05-25 ENCOUNTER — Encounter: Payer: Self-pay | Admitting: Physical Therapy

## 2014-05-25 ENCOUNTER — Ambulatory Visit: Payer: Medicare HMO | Attending: Internal Medicine | Admitting: Physical Therapy

## 2014-05-25 DIAGNOSIS — R2689 Other abnormalities of gait and mobility: Secondary | ICD-10-CM

## 2014-05-25 DIAGNOSIS — R29818 Other symptoms and signs involving the nervous system: Secondary | ICD-10-CM | POA: Insufficient documentation

## 2014-05-25 DIAGNOSIS — R269 Unspecified abnormalities of gait and mobility: Secondary | ICD-10-CM | POA: Diagnosis not present

## 2014-05-25 NOTE — Therapy (Signed)
[facility] [address] [place], [place], [address] Phone: [phone]   Fax:  [phone]  Physical Therapy Evaluation  Patient Details  Name: [name] [name] MRN: [id number] Date of Birth: 20-Jul-1931 Referring Provider:  [name], MD  Encounter Date: 05/25/2014      PT End of Session - 05/25/14 1707    Visit Number 1  G1   Number of Visits 17   Date for PT Re-Evaluation 07/24/14   Authorization Type [employer]   Authorization Time Period 05-25-14 - 07-24-14   PT Start Time 1537   PT Stop Time 1625   PT Time Calculation (min) 48 min      Past Medical History  Diagnosis Date  . Thyroid disease     hypothyroid  . Cataract     bilateral  . Anemia     iron def  . Iron deficiency anemia 08/10/2010  . B12 deficiency 08/10/2010  . Hypertension   . Gait disorder 04/13/2014    Past Surgical History  Procedure Laterality Date  . Vagotomy  1971  . Abdominal hysterectomy  1975  . Appendectomy  1955  . Cholecystectomy  1978  . Esophageal dilation  2001  . Esophageal dilation  2004  . Vagotomy      There were no vitals filed for this visit.  Visit Diagnosis:  Abnormality of gait - Plan: PT plan of care cert/re-cert  Balance problem - Plan: PT plan of care cert/re-cert      Subjective Assessment - 05/25/14 1546    Subjective Pt. reports fall sustained last night around 10:00 - went to ED; CAT scan was normal - also hit R shoulder in the fall; x-rays were normal; husband reports pt. has been shuffling for a while   Patient is accompained by: Family member  husband   Pertinent History HTN   Diagnostic tests ENG and NCV testing;   Patient Stated Goals improve balance and walking   Currently in Pain? Yes   Pain Score 5    Pain Location Shoulder   Pain Orientation Right   Pain Descriptors / Indicators Sharp   Pain Type Acute pain   Pain Onset Yesterday   Aggravating Factors  weight bearing with  RUE on cane   Pain Relieving Factors rest and not moving or using RUE   Multiple Pain Sites No            [facility] PT Assessment - 05/25/14 1554    Assessment   Medical Diagnosis Gait abnormality   Onset Date --  several years ago; pt. has been using cane for past month   Precautions   Precautions Fall   Balance Screen   Has the patient fallen in the past 6 months Yes   How many times? 3   Has the patient had a decrease in activity level because of a fear of falling?  No   Is the patient reluctant to leave their home because of a fear of falling?  No   Home Environment   Living Enviornment Private residence   Living Arrangements Spouse/significant other   Type of [place] to enter   Entrance Stairs-Number of Steps 2   Entrance Stairs-Rails None   Home Layout Multi-level   Posture/Postural Control   Posture/Postural Control No significant limitations   Posture Comments posterior pelvic tilt    ROM / Strength   AROM / PROM / Strength Strength   Strength  Overall Strength Comments grossly WFL's for bil. hip, knee and ankle musculature   Ambulation/Gait   Ambulation/Gait Yes   Ambulation/Gait Assistance 6: Modified independent (Device/Increase time)   Ambulation Distance (Feet) 100 Feet   Assistive device --  cane   Gait Pattern Right flexed knee in stance;Left flexed knee in stance;Step-through pattern;Decreased step length - left;Decreased step length - right  pelvis in posterior pelvic tilt due to tight hamstrings   Ambulation Surface Level;Indoor   Gait velocity 1.08  30.13 secs with cane; 26.69 secs 2nd trial (1.23 ft/sec)   Berg Balance Test   Sit to Stand Able to stand without using hands and stabilize independently   Standing Unsupported Able to stand safely 2 minutes   Sitting with Back Unsupported but Feet Supported on Floor or Stool Able to sit safely and securely 2 minutes   Stand to Sit Sits safely with minimal use of hands   Transfers  Able to transfer safely, minor use of hands   Standing Unsupported with Eyes Closed Able to stand 10 seconds safely   Standing Ubsupported with Feet Together Able to place feet together independently and stand 1 minute safely   From Standing, Reach Forward with Outstretched Arm Can reach forward >12 cm safely (5")   From Standing Position, Pick up Object from Floor Unable to try/needs assist to keep balance  9"   From Standing Position, Turn to Look Behind Over each Shoulder Turn sideways only but maintains balance   Turn 360 Degrees Needs close supervision or verbal cueing   Standing Unsupported, Alternately Place Feet on Step/Stool Able to complete >2 steps/needs minimal assist   Standing Unsupported, One Foot in Front Able to plae foot ahead of the other independently and hold 30 seconds   Standing on One Leg Tries to lift leg/unable to hold 3 seconds but remains standing independently   Total Score 39   Timed Up and Go Test   TUG Normal TUG   Normal TUG (seconds) 28.71  with cane; 26.84 without cane                           PT Education - 05/25/14 1652    Education provided Yes   Education Details seated bil. hamstring stretch - seated   Person(s) Educated Patient;Spouse   Methods Explanation;Demonstration   Comprehension Verbalized understanding;Returned demonstration          PT Short Term Goals - 05/25/14 1725    PT SHORT TERM GOAL #1   Title Incr. Berg score to >/= 44/56 to reduce fall risk.  (06-24-14)   Baseline 39/56 on 05-25-14                 Time 4   Period Weeks   Status New   PT SHORT TERM GOAL #2   Title Improve TUG score to </= 24 secs with cane for improved functional mobility.  (06-24-14)   Baseline 28.71 with cane   Time 4   Period Weeks   Status New   PT SHORT TERM GOAL #3   Title Incr. gait velocity to >/= 1.6 ft/sec with cane for incr. gait efficiency.   (06-24-14)   Baseline 1.23 ft/sec with cane   Time 4   Period Weeks   Status  New   PT SHORT TERM GOAL #4   Title Independent in HEP                (06-24-14)  Time 4   Period Weeks   Status New           PT Long Term Goals - 06-Jun-2014 1731    PT LONG TERM GOAL #1   Title Incr. Berg score to >/= 48/56 to decr. fall risk  (07-24-14)   Baseline 39/56 on 06-Jun-2014   Time 8   Period Weeks   Status New   PT LONG TERM GOAL #2   Title Incr. TUG score to </= 20 secs with cane with SBA for improved functional mobility   (07-24-14)   Baseline 28.71 secs with cane   Time 8   Period Weeks   Status New   PT LONG TERM GOAL #3   Title Incr. gait velocity to >/= 2.0 ft/sec with cane for incr. gait efficiency.   (07-24-14)   Baseline 1.23 ft/sec with cane   Time 8   Period Weeks   Status New   PT LONG TERM GOAL #4   Title Modified independent household amb. without device per pt. report. (07-24-14)   Time 8   Period Weeks   Status New   PT LONG TERM GOAL #5   Title Incr. FOTO by at least 10 points for incr. pt satisfaction  (07-24-14)   Baseline to be completed next session   Time 8   Period Weeks   Status New               Plan - 06/06/2014 1713    Clinical Impression Statement Pt. is at high fall risk per Merrilee Jansky, TUG and gait velocity scores; pt. presents with discoloration on R eye and right side of face with some purplish color under left eye due to fall sustained last night;  pt. amb. with no L arm swing - keeps left hand on anterior left thigh and is posteriorly tilted due to bil. tight hamstrings; pt.'s husband reports that pt. shuffles at home but none was observed during eval; husband stated that pt.'s gait today was best he had seen her walk in past few months                                                                                                                    Pt will benefit from skilled therapeutic intervention in order to improve on the following deficits Abnormal gait;Decreased range of motion;Impaired flexibility;Decreased  balance;Pain;Decreased mobility;Decreased strength   Rehab Potential Good   PT Frequency 2x / week   PT Duration 8 weeks   PT Treatment/Interventions ADLs/Self Care Home Management;Therapeutic activities;Patient/family education;Therapeutic exercise;Gait training;Balance training;Passive range of motion;Stair training;Neuromuscular re-education;Functional mobility training   PT Next Visit Plan check hamstring stretching; begin balance HEP   PT Home Exercise Plan balance, stretching   Consulted and Agree with Plan of Care Patient;Family member/caregiver   Family Member Consulted spouse          G-Codes - 06-06-2014 1737    Functional Assessment Tool Used Merrilee Jansky score 39/56:  TUG score 28.71 secs with cane:  gait  velocity 1.23 ft/sec with cane   Functional Limitation Mobility: Walking and moving around   Mobility: Walking and Moving Around Current Status [phone]) At least 40 percent but less than 60 percent impaired, limited or restricted   Mobility: Walking and Moving Around Goal Status [phone]) At least 20 percent but less than 40 percent impaired, limited or restricted       Problem List Patient Active Problem List   Diagnosis Date Noted  . Gait disorder 04/13/2014  . Eosinophilia 07/15/2013  . Iron deficiency anemia 08/10/2010  . B12 deficiency 08/10/2010    Alda Lea, PT 05/25/2014, 5:41 PM  [facility] [address] [place], [place], [address] Phone: [phone]   Fax:  [phone]

## 2014-05-25 NOTE — Patient Instructions (Signed)
Hamstring: Stretch (Sitting)   Sit straight. Extend right knee until stretch is felt in back of thigh. Hold __45-60_ seconds. Relax. Repeat _2__ times. Do _3-4__ times a day.  (BOTH LEGS) Repeat with other leg. Advanced: Lean trunk forward.  Copyright  VHI. All rights reserved.

## 2014-05-30 ENCOUNTER — Ambulatory Visit: Payer: Medicare HMO | Admitting: Physical Therapy

## 2014-05-30 DIAGNOSIS — R2689 Other abnormalities of gait and mobility: Secondary | ICD-10-CM

## 2014-05-30 DIAGNOSIS — R269 Unspecified abnormalities of gait and mobility: Secondary | ICD-10-CM | POA: Diagnosis not present

## 2014-05-30 NOTE — Therapy (Signed)
[facility] [address] [place], [place], [address] Phone: [phone]   Fax:  [phone]  Physical Therapy Treatment  Patient Details  Name: [name] [name] MRN: [id number] Date of Birth: 1931/04/07 Referring Provider:  [name], MD  Encounter Date: 05/30/2014      PT End of Session - 05/30/14 1755    Visit Number 2   Number of Visits 17   Date for PT Re-Evaluation 07/24/14   Authorization Type Aetna Medicare   Authorization Time Period 05-25-14 - 07-24-14   PT Start Time 1318   PT Stop Time 1402   PT Time Calculation (min) 44 min   Equipment Utilized During Treatment Gait belt   Activity Tolerance Patient tolerated treatment well   Behavior During Therapy [facility] for tasks assessed/performed      Past Medical History  Diagnosis Date  . Thyroid disease     hypothyroid  . Cataract     bilateral  . Anemia     iron def  . Iron deficiency anemia 08/10/2010  . B12 deficiency 08/10/2010  . Hypertension   . Gait disorder 04/13/2014    Past Surgical History  Procedure Laterality Date  . Vagotomy  1971  . Abdominal hysterectomy  1975  . Appendectomy  1955  . Cholecystectomy  1978  . Esophageal dilation  2001  . Esophageal dilation  2004  . Vagotomy      There were no vitals filed for this visit.  Visit Diagnosis:  Balance problem  Abnormality of gait      Subjective Assessment - 05/30/14 1321    Subjective Denies falls since last visit.  Did see family md but no changes.     Patient is accompained by: Family member   Pertinent History HTN   Diagnostic tests ENG and NCV testing;   Patient Stated Goals improve balance and walking   Currently in Pain? Yes   Pain Score 3   none at rest but 3/10 with movement   Pain Location Shoulder   Pain Orientation Right   Pain Descriptors / Indicators Sharp   Pain Type Acute pain   Pain Onset In the past 7 days   Pain Frequency Intermittent   Aggravating  Factors  movement   Pain Relieving Factors rest and not moving   Multiple Pain Sites No                         [facility] Adult PT Treatment/Exercise - 05/30/14 0001    Knee/Hip Exercises: Stretches   Active Hamstring Stretch 3 reps;60 seconds;Other (comment)  seated edge of mat-provided as HEP             Balance Exercises - 05/30/14 1751    OTAGO PROGRAM   Head Movements Sitting;5 reps   Neck Movements Sitting;5 reps   Back Extension Standing;5 reps   Trunk Movements Standing;5 reps   Ankle Movements Sitting;10 reps   Knee Extensor 10 reps   Knee Flexor 10 reps   Hip ABductor 10 reps   Ankle Plantorflexors 20 reps, support   Ankle Dorsiflexors 20 reps, support   Knee Bends 10 reps, support           PT Education - 05/30/14 1754    Education provided Yes   Education Details OTAGO and bil hamstring stretch seated   Person(s) Educated Patient;Spouse   Methods Explanation;Demonstration;Handout   Comprehension Verbalized understanding;Returned demonstration  PT Short Term Goals - 05/25/14 1725    PT SHORT TERM GOAL #1   Title Incr. Berg score to >/= 44/56 to reduce fall risk.  (06-24-14)   Baseline 39/56 on 05-25-14                 Time 4   Period Weeks   Status New   PT SHORT TERM GOAL #2   Title Improve TUG score to </= 24 secs with cane for improved functional mobility.  (06-24-14)   Baseline 28.71 with cane   Time 4   Period Weeks   Status New   PT SHORT TERM GOAL #3   Title Incr. gait velocity to >/= 1.6 ft/sec with cane for incr. gait efficiency.   (06-24-14)   Baseline 1.23 ft/sec with cane   Time 4   Period Weeks   Status New   PT SHORT TERM GOAL #4   Title Independent in HEP                (06-24-14)   Time 4   Period Weeks   Status New           PT Long Term Goals - 05/25/14 1731    PT LONG TERM GOAL #1   Title Incr. Berg score to >/= 48/56 to decr. fall risk  (07-24-14)   Baseline 39/56 on 05-25-14   Time 8    Period Weeks   Status New   PT LONG TERM GOAL #2   Title Incr. TUG score to </= 20 secs with cane with SBA for improved functional mobility   (07-24-14)   Baseline 28.71 secs with cane   Time 8   Period Weeks   Status New   PT LONG TERM GOAL #3   Title Incr. gait velocity to >/= 2.0 ft/sec with cane for incr. gait efficiency.   (07-24-14)   Baseline 1.23 ft/sec with cane   Time 8   Period Weeks   Status New   PT LONG TERM GOAL #4   Title Modified independent household amb. without device per pt. report. (07-24-14)   Time 8   Period Weeks   Status New   PT LONG TERM GOAL #5   Title Incr. FOTO by at least 10 points for incr. pt satisfaction  (07-24-14)   Baseline to be completed next session   Time 8   Period Weeks   Status New               Plan - 05/30/14 1756    Clinical Impression Statement Pt tolerated initiation of OTAGO well today.  Continue PT per POC.   Pt will benefit from skilled therapeutic intervention in order to improve on the following deficits Abnormal gait;Decreased range of motion;Impaired flexibility;Decreased balance;Pain;Decreased mobility;Decreased strength   Rehab Potential Good   PT Frequency 2x / week   PT Duration 8 weeks   PT Treatment/Interventions ADLs/Self Care Home Management;Therapeutic activities;Patient/family education;Therapeutic exercise;Gait training;Balance training;Passive range of motion;Stair training;Neuromuscular re-education;Functional mobility training   PT Next Visit Plan Review hamstring stretch, OTAGO and finish giving OTAGO exercises.   Consulted and Agree with Plan of Care Patient;Family member/caregiver   Family Member Consulted spouse        Problem List Patient Active Problem List   Diagnosis Date Noted  . Gait disorder 04/13/2014  . Eosinophilia 07/15/2013  . Iron deficiency anemia 08/10/2010  . B12 deficiency 08/10/2010    Narda Bonds 05/30/2014, 5:58 PM  [facility] [address] [place], [place], [address] Phone: [phone]   Fax:  [facility], [place] 05/30/2014 5:58 PM Phone: [phone] Fax: [phone]

## 2014-05-30 NOTE — Patient Instructions (Signed)
Leg Extension (Hamstring)   Sit toward front edge of chair, with leg out straight, heel on floor, toes pointing toward body. Keeping back straight, bend forward at hip and hold for 60 seconds. Repeat 2 times. Repeat with other leg. Do 3 sessions per day.  Copyright  VHI. All rights reserved.

## 2014-06-02 ENCOUNTER — Ambulatory Visit: Payer: Medicare HMO

## 2014-06-02 DIAGNOSIS — R269 Unspecified abnormalities of gait and mobility: Secondary | ICD-10-CM | POA: Diagnosis not present

## 2014-06-02 DIAGNOSIS — R2689 Other abnormalities of gait and mobility: Secondary | ICD-10-CM

## 2014-06-02 NOTE — Therapy (Signed)
[facility] [address] [place], [place], [address] Phone: [phone]   Fax:  [phone]  Physical Therapy Treatment  Patient Details  Name: [name] [name] MRN: [id number] Date of Birth: [birth year] Referring Provider:  [name], MD  Encounter Date: 06/02/2014      PT End of Session - 06/02/14 1507    Visit Number 3   Number of Visits 17   Date for PT Re-Evaluation 07/24/14   Authorization Type Aetna Medicare   Authorization Time Period 05-25-14 - 07-24-14   PT Start Time 1317   PT Stop Time 1359   PT Time Calculation (min) 42 min   Equipment Utilized During Treatment Gait belt   Activity Tolerance Patient tolerated treatment well   Behavior During Therapy [facility] for tasks assessed/performed      Past Medical History  Diagnosis Date  . Thyroid disease     hypothyroid  . Cataract     bilateral  . Anemia     iron def  . Iron deficiency anemia 08/10/2010  . B12 deficiency 08/10/2010  . Hypertension   . Gait disorder 04/13/2014    Past Surgical History  Procedure Laterality Date  . Vagotomy  1971  . Abdominal hysterectomy  1975  . Appendectomy  1955  . Cholecystectomy  1978  . Esophageal dilation  2001  . Esophageal dilation  2004  . Vagotomy      There were no vitals filed for this visit.  Visit Diagnosis:  Balance problem  Abnormality of gait      Subjective Assessment - 06/02/14 1319    Subjective Pt denied falls or changes since last visit. pt's husband, Effie Shy, present during session.   Patient is accompained by: Family member   Pertinent History HTN   Diagnostic tests ENG and NCV testing;   Patient Stated Goals improve balance and walking   Currently in Pain? No/denies      Therex: Reviewed HEP -Seated B hamstring stretch 2x30sec. Holds. Cues to keep knee extended to improve stretch.                        Balance Exercises - 06/02/14 1320    OTAGO PROGRAM   Backwards Walking Support   Walking and Turning Around Assistive device   Sideways Walking Assistive device   Tandem Stance 10 seconds, support   Tandem Walk Support   One Leg Stand 10 seconds, support   Heel Walking Support   Toe Walk Support   Heel Toe Walking Backward --  holding support   Sit to Stand 10 reps, no support   Stair Walking n/a   Overall OTAGO Comments Cues for technique, improve stride length and to look straight ahead. Pt required increased time during all activities due to decreased speed.           PT Education - 06/02/14 1506    Education provided Yes   Education Details Finished OTAGO and seated B hamstring stretch. PT educated pt on the importance of ambulating with a SPC and to use SPC during figure eight turns HEP.   Person(s) Educated Patient;Spouse   Methods Explanation;Demonstration;Handout   Comprehension Verbalized understanding;Returned demonstration          PT Short Term Goals - 06/02/14 1510    PT SHORT TERM GOAL #1   Title Incr. Berg score to >/= 44/56 to reduce fall risk.  (06-24-14)   Baseline 39/56 on 05-25-14  Time 4   Period Weeks   Status On-going   PT SHORT TERM GOAL #2   Title Improve TUG score to </= 24 secs with cane for improved functional mobility.  (06-24-14)   Baseline 28.71 with cane   Time 4   Period Weeks   Status On-going   PT SHORT TERM GOAL #3   Title Incr. gait velocity to >/= 1.6 ft/sec with cane for incr. gait efficiency.   (06-24-14)   Baseline 1.23 ft/sec with cane   Time 4   Period Weeks   Status On-going   PT SHORT TERM GOAL #4   Title Independent in HEP                (06-24-14)   Time 4   Period Weeks   Status On-going   PT SHORT TERM GOAL #5   Status On-going           PT Long Term Goals - 06/02/14 1510    PT LONG TERM GOAL #1   Title Incr. Berg score to >/= 48/56 to decr. fall risk  (07-24-14)   Baseline 39/56 on 05-25-14   Time 8   Period Weeks   Status On-going   PT LONG  TERM GOAL #2   Title Incr. TUG score to </= 20 secs with cane with SBA for improved functional mobility   (07-24-14)   Baseline 28.71 secs with cane   Time 8   Period Weeks   Status On-going   PT LONG TERM GOAL #3   Title Incr. gait velocity to >/= 2.0 ft/sec with cane for incr. gait efficiency.   (07-24-14)   Baseline 1.23 ft/sec with cane   Time 8   Period Weeks   Status On-going   PT LONG TERM GOAL #4   Title Modified independent household amb. without device per pt. report. (07-24-14)   Time 8   Period Weeks   Status On-going   PT LONG TERM GOAL #5   Title Incr. FOTO by at least 10 points for incr. pt satisfaction  (07-24-14)   Baseline to be completed next session   Time 8   Period Weeks   Status On-going               Plan - 06/02/14 1507    Clinical Impression Statement Pt tolerated OTAGO well, and was able to perform all activities with UE support or an AD without LOB. Pt required cues to improve stride length and technique during high level balance activities. Continue with POC.    Pt will benefit from skilled therapeutic intervention in order to improve on the following deficits Abnormal gait;Decreased range of motion;Impaired flexibility;Decreased balance;Pain;Decreased mobility;Decreased strength   Rehab Potential Good   PT Frequency 2x / week   PT Duration 8 weeks   PT Treatment/Interventions ADLs/Self Care Home Management;Therapeutic activities;Patient/family education;Therapeutic exercise;Gait training;Balance training;Passive range of motion;Stair training;Neuromuscular re-education;Functional mobility training   PT Next Visit Plan Provide clamshell exercise HEP and begin gait training with SPC.   PT Home Exercise Plan OTAGO   Consulted and Agree with Plan of Care Patient;Family member/caregiver   Family Member Consulted spouse-Edgar        Problem List Patient Active Problem List   Diagnosis Date Noted  . Gait disorder 04/13/2014  . Eosinophilia  07/15/2013  . Iron deficiency anemia 08/10/2010  . B12 deficiency 08/10/2010    Morrie Daywalt L 06/02/2014, 3:11 PM  [facility] [address]  [place], [place], [address] Phone: [phone]   Fax:  [phone]     Geoffry Paradise, PT,DPT 06/02/2014 3:11 PM Phone: [phone] Fax: [phone]

## 2014-06-04 ENCOUNTER — Encounter (HOSPITAL_COMMUNITY): Payer: Self-pay | Admitting: *Deleted

## 2014-06-04 ENCOUNTER — Emergency Department (HOSPITAL_COMMUNITY)
Admission: EM | Admit: 2014-06-04 | Discharge: 2014-06-04 | Disposition: A | Discharge status: Home / Self Care | Payer: Medicare HMO | Attending: Emergency Medicine | Admitting: Emergency Medicine

## 2014-06-04 DIAGNOSIS — S0101XA Laceration without foreign body of scalp, initial encounter: Secondary | ICD-10-CM | POA: Diagnosis not present

## 2014-06-04 DIAGNOSIS — Z79899 Other long term (current) drug therapy: Secondary | ICD-10-CM | POA: Diagnosis not present

## 2014-06-04 DIAGNOSIS — W010XXD Fall on same level from slipping, tripping and stumbling without subsequent striking against object, subsequent encounter: Secondary | ICD-10-CM | POA: Insufficient documentation

## 2014-06-04 DIAGNOSIS — Y999 Unspecified external cause status: Secondary | ICD-10-CM | POA: Insufficient documentation

## 2014-06-04 DIAGNOSIS — E039 Hypothyroidism, unspecified: Secondary | ICD-10-CM | POA: Insufficient documentation

## 2014-06-04 DIAGNOSIS — Y929 Unspecified place or not applicable: Secondary | ICD-10-CM | POA: Insufficient documentation

## 2014-06-04 DIAGNOSIS — Z862 Personal history of diseases of the blood and blood-forming organs and certain disorders involving the immune mechanism: Secondary | ICD-10-CM | POA: Diagnosis not present

## 2014-06-04 DIAGNOSIS — I10 Essential (primary) hypertension: Secondary | ICD-10-CM | POA: Diagnosis not present

## 2014-06-04 DIAGNOSIS — Y939 Activity, unspecified: Secondary | ICD-10-CM | POA: Diagnosis not present

## 2014-06-04 DIAGNOSIS — W19XXXD Unspecified fall, subsequent encounter: Secondary | ICD-10-CM

## 2014-06-04 DIAGNOSIS — Y92009 Unspecified place in unspecified non-institutional (private) residence as the place of occurrence of the external cause: Secondary | ICD-10-CM

## 2014-06-04 DIAGNOSIS — Z8669 Personal history of other diseases of the nervous system and sense organs: Secondary | ICD-10-CM | POA: Insufficient documentation

## 2014-06-04 DIAGNOSIS — S40212A Abrasion of left shoulder, initial encounter: Secondary | ICD-10-CM | POA: Insufficient documentation

## 2014-06-04 MED ORDER — LIDOCAINE-EPINEPHRINE (PF) 2 %-1:200000 IJ SOLN
10.0000 mL | Freq: Once | INTRAMUSCULAR | Status: AC
  Administered 2014-06-04: 10 mL
  Filled 2014-06-04: qty 20

## 2014-06-04 NOTE — Discharge Instructions (Signed)
Fall Prevention and Home Safety Falls cause injuries and can affect all age groups. It is possible to use preventive measures to significantly decrease the likelihood of falls. There are many simple measures which can make your home safer and prevent falls. OUTDOORS  Repair cracks and edges of walkways and driveways.  Remove high doorway thresholds.  Trim shrubbery on the main path into your home.  Have good outside lighting.  Clear walkways of tools, rocks, debris, and clutter.  Check that handrails are not broken and are securely fastened. Both sides of steps should have handrails.  Have leaves, snow, and ice cleared regularly.  Use sand or salt on walkways during winter months.  In the garage, clean up grease or oil spills. BATHROOM  Install night lights.  Install grab bars by the toilet and in the tub and shower.  Use non-skid mats or decals in the tub or shower.  Place a plastic non-slip stool in the shower to sit on, if needed.  Keep floors dry and clean up all water on the floor immediately.  Remove soap buildup in the tub or shower on a regular basis.  Secure bath mats with non-slip, double-sided rug tape.  Remove throw rugs and tripping hazards from the floors. BEDROOMS  Install night lights.  Make sure a bedside light is easy to reach.  Do not use oversized bedding.  Keep a telephone by your bedside.  Have a firm chair with side arms to use for getting dressed.  Remove throw rugs and tripping hazards from the floor. KITCHEN  Keep handles on pots and pans turned toward the center of the stove. Use back burners when possible.  Clean up spills quickly and allow time for drying.  Avoid walking on wet floors.  Avoid hot utensils and knives.  Position shelves so they are not too high or low.  Place commonly used objects within easy reach.  If necessary, use a sturdy step stool with a grab bar when reaching.  Keep electrical cables out of the  way.  Do not use floor polish or wax that makes floors slippery. If you must use wax, use non-skid floor wax.  Remove throw rugs and tripping hazards from the floor. STAIRWAYS  Never leave objects on stairs.  Place handrails on both sides of stairways and use them. Fix any loose handrails. Make sure handrails on both sides of the stairways are as long as the stairs.  Check carpeting to make sure it is firmly attached along stairs. Make repairs to worn or loose carpet promptly.  Avoid placing throw rugs at the top or bottom of stairways, or properly secure the rug with carpet tape to prevent slippage. Get rid of throw rugs, if possible.  Have an electrician put in a light switch at the top and bottom of the stairs. OTHER FALL PREVENTION TIPS  Wear low-heel or rubber-soled shoes that are supportive and fit well. Wear closed toe shoes.  When using a stepladder, make sure it is fully opened and both spreaders are firmly locked. Do not climb a closed stepladder.  Add color or contrast paint or tape to grab bars and handrails in your home. Place contrasting color strips on first and last steps.  Learn and use mobility aids as needed. Install an electrical emergency response system.  Turn on lights to avoid dark areas. Replace light bulbs that burn out immediately. Get light switches that glow.  Arrange furniture to create clear pathways. Keep furniture in the same place.  Firmly attach carpet with non-skid or double-sided tape.  Eliminate uneven floor surfaces.  Select a carpet pattern that does not visually hide the edge of steps.  Be aware of all pets. OTHER HOME SAFETY TIPS  Set the water temperature for 120 F (48.8 C).  Keep emergency numbers on or near the telephone.  Keep smoke detectors on every level of the home and near sleeping areas. Document Released: 01/10/2002 Document Revised: 07/22/2011 Document Reviewed: 04/11/2011 [facility] Patient Information 2015  [place], [place]. This information is not intended to replace advice given to you by your health care provider. Make sure you discuss any questions you have with your health care provider.  Stitches, Staples, or Skin Adhesive Strips  Stitches (sutures), staples, and skin adhesive strips hold the skin together as it heals. They will usually be in place for 7 days or less. HOME CARE  Wash your hands with soap and water before and after you touch your wound.  Only take medicine as told by your doctor.  Cover your wound only if your doctor told you to. Otherwise, leave it open to air.  Do not get your stitches wet or dirty. If they get dirty, dab them gently with a clean washcloth. Wet the washcloth with soapy water. Do not rub. Pat them dry gently.  Do not put medicine or medicated cream on your stitches unless your doctor told you to.  Do not take out your own stitches or staples. Skin adhesive strips will fall off by themselves.  Do not pick at the wound. Picking can cause an infection.  Do not miss your follow-up appointment.  If you have problems or questions, call your doctor. GET HELP RIGHT AWAY IF:   You have a temperature by mouth above 102 F (38.9 C), not controlled by medicine.  You have chills.  You have redness or pain around your stitches.  There is puffiness (swelling) around your stitches.  You notice fluid (drainage) from your stitches.  There is a bad smell coming from your wound. MAKE SURE YOU:  Understand these instructions.  Will watch your condition.  Will get help if you are not doing well or get worse. Document Released: 11/17/2008 Document Revised: 04/14/2011 Document Reviewed: 11/17/2008 [facility] Patient Information 2015 [place], [place]. This information is not intended to replace advice given to you by your health care provider. Make sure you discuss any questions you have with your health care provider.

## 2014-06-04 NOTE — ED Notes (Signed)
The pt fell against a piece of furniture going to the br.  No loc. Lac to her scalp to the back of her head bleeding controlled.  She has had numerus falls in the past few weeks.  Old bruises to her face

## 2014-06-04 NOTE — ED Provider Notes (Signed)
CSN: [id number]     Arrival date & time 06/04/14  0335 History   First MD Initiated Contact with Patient 06/04/14 [phone]     Chief Complaint  Patient presents with  . Fall     (Consider location/radiation/quality/duration/timing/severity/associated sxs/prior Treatment) HPI 81 year old female presents to the emergency department after a fall.  Patient reports she got to the bathroom in the low night, and stumbled against a chest of drawers.  Patient fell down striking the back of her head.  She denies LOC.  She denies any headache, nausea, vomiting.  No confusion.  Patient also reports that she scraped her left shoulder.  She denies any neck pain.  Patient has had recent falls over the last several weeks, seen in the emergency department on April 20 for a fall.  Patient is currently in physical therapy to help with balance.  Husband reports that she has 2 canes but won't use them.  He plans to get a walker today as well as a bedside commode.  They believe that she is up-to-date on her immunizations. Past Medical History  Diagnosis Date  . Thyroid disease     hypothyroid  . Cataract     bilateral  . Anemia     iron def  . Iron deficiency anemia 08/10/2010  . B12 deficiency 08/10/2010  . Hypertension   . Gait disorder 04/13/2014   Past Surgical History  Procedure Laterality Date  . Vagotomy  1971  . Abdominal hysterectomy  1975  . Appendectomy  1955  . Cholecystectomy  1978  . Esophageal dilation  2001  . Esophageal dilation  2004  . Vagotomy     Family History  Problem Relation Age of Onset  . Cancer Sister   . Hypertension Brother   . Heart disease Father    History  Substance Use Topics  . Smoking status: Never Smoker   . Smokeless tobacco: Never Used  . Alcohol Use: 4.2 oz/week    7 Glasses of wine per week     Comment: occ   OB History    No data available     Review of Systems  See History of Present Illness; otherwise all other systems are reviewed and  negative   Allergies  Fergon; Ampicillin; Cephalexin; Codeine; Famotidine; Nizatidine; Tagamet; and Salmon  Home Medications   Prior to Admission medications   Medication Sig Start Date End Date Taking? Authorizing Provider  acetaminophen (TYLENOL) 325 MG tablet Take 650 mg by mouth every 6 (six) hours as needed. For aches/pains    Historical Provider, MD  Ascorbic Acid (VITAMIN C) 500 MG CAPS Take 500 mg by mouth 4 (four) times daily.      Historical Provider, MD  b complex vitamins capsule Take 1 capsule by mouth daily.    Historical Provider, MD  Calcium Carbonate-Vitamin D (CALCIUM 500 + D PO) Take by mouth. 3 tablets daily    Historical Provider, MD  diphenhydrAMINE (BENADRYL) 25 mg capsule Take 25 mg by mouth at bedtime as needed. For allergies    Historical Provider, MD  estradiol (ESTRACE) 1 MG tablet Take 1 mg by mouth daily.    Historical Provider, MD  levothyroxine (SYNTHROID, LEVOTHROID) 112 MCG tablet Take 112 mcg by mouth daily before breakfast.    Historical Provider, MD  loperamide (IMODIUM) 2 MG capsule Take 4 mg by mouth 4 (four) times daily as needed for diarrhea or loose stools.     Historical Provider, MD  losartan (COZAAR) 25 MG tablet  Take 25 mg by mouth daily. Takes 1/2 tablet daily     Historical Provider, MD  omeprazole (PRILOSEC) 20 MG capsule Take 20 mg by mouth 2 (two) times daily.  05/20/12   Historical Provider, MD   BP 160/61 mmHg  Pulse 81  Temp(Src) 98.2 F (36.8 C) (Oral)  Resp 20  SpO2 97% Physical Exam  Constitutional: She is oriented to person, place, and time. She appears well-developed and well-nourished. No distress.  HENT:  Head: Normocephalic and atraumatic.  Nose: Nose normal.  Mouth/Throat: Oropharynx is clear and moist.  3 cm laceration to posterior scalp.  Bleeding is controlled.  No crepitus or step-off.  Eyes: Conjunctivae and EOM are normal. Pupils are equal, round, and reactive to light.  Neck: Normal range of motion. Neck supple.  No JVD present. No tracheal deviation present. No thyromegaly present.  Patient has abrasion to left shoulder.  There is no step-off or crepitus to the neck.  No tenderness with palpation of the bony prominences of the neck.  She has mild left paraspinal muscle tenderness.  Cardiovascular: Normal rate, regular rhythm, normal heart sounds and intact distal pulses.  Exam reveals no gallop and no friction rub.   No murmur heard. Pulmonary/Chest: Effort normal and breath sounds normal. No stridor. No respiratory distress. She has no wheezes. She has no rales. She exhibits no tenderness.  Abdominal: Soft. Bowel sounds are normal. She exhibits no distension and no mass. There is no tenderness. There is no rebound and no guarding.  Musculoskeletal: Normal range of motion. She exhibits no edema or tenderness.  Lymphadenopathy:    She has no cervical adenopathy.  Neurological: She is alert and oriented to person, place, and time. She displays normal reflexes. She exhibits normal muscle tone. Coordination normal.  Skin: Skin is warm and dry. No rash noted. No erythema. No pallor.  Psychiatric: She has a normal mood and affect. Her behavior is normal. Judgment and thought content normal.  Nursing note and vitals reviewed.   ED Course  Procedures (including critical care time) Labs Review Labs Reviewed - No data to display  Imaging Review No results found.   EKG Interpretation None     LACERATION REPAIR Performed by: Kalman Drape Authorized by: Kalman Drape Consent: Verbal consent obtained. Risks and benefits: risks, benefits and alternatives were discussed Consent gPosterior scalpiven by: patient Patient identity confirmed: provided demographic data Prepped and Draped in normal sterile fashion Wound explored  Laceration  Location: Posterior scalp  Laceration Length: 3cm  No Foreign Bodies seen or palpated  Anesthesia: local infiltration  Local anesthetic: lidocaine 2% with  epinephrine  Anesthetic total: 3 ml  Irrigation method: syringe Amount of cleaning: standard  Skin closure: surgical staples  Number of sutures: 6  Technique: staples  Patient tolerance: Patient tolerated the procedure well with no immediate complications. MDM   Final diagnoses:  Fall at home, subsequent encounter  Scalp laceration, initial encounter    81 year old female status post fall.  Patient without loss of consciousness, anticoagulation, neck pain.  Plan to repair scalp laceration with staples.    Linton Flemings, MD 06/04/14 [phone]

## 2014-06-12 ENCOUNTER — Ambulatory Visit: Payer: Medicare HMO | Attending: Internal Medicine | Admitting: Physical Therapy

## 2014-06-12 DIAGNOSIS — R2689 Other abnormalities of gait and mobility: Secondary | ICD-10-CM

## 2014-06-12 DIAGNOSIS — R29818 Other symptoms and signs involving the nervous system: Secondary | ICD-10-CM | POA: Insufficient documentation

## 2014-06-12 DIAGNOSIS — R269 Unspecified abnormalities of gait and mobility: Secondary | ICD-10-CM | POA: Diagnosis not present

## 2014-06-13 ENCOUNTER — Encounter: Payer: Self-pay | Admitting: Physical Therapy

## 2014-06-13 NOTE — Therapy (Signed)
[facility] [address] [place], [place], [address] Phone: [phone]   Fax:  [phone]  Physical Therapy Treatment  Patient Details  Name: [name] [name] MRN: [id number] Date of Birth: 10/01/1931 Referring Provider:  [name], MD  Encounter Date: 06/12/2014      PT End of Session - 06/13/14 0912    Visit Number 4  G4   Number of Visits 17   Date for PT Re-Evaluation 07/24/14   Authorization Type [employer]   Authorization Time Period 05-25-14 - 07-24-14   PT Start Time 0933   PT Stop Time 1016   PT Time Calculation (min) 43 min      Past Medical History  Diagnosis Date  . Thyroid disease     hypothyroid  . Cataract     bilateral  . Anemia     iron def  . Iron deficiency anemia 08/10/2010  . B12 deficiency 08/10/2010  . Hypertension   . Gait disorder 04/13/2014    Past Surgical History  Procedure Laterality Date  . Vagotomy  1971  . Abdominal hysterectomy  1975  . Appendectomy  1955  . Cholecystectomy  1978  . Esophageal dilation  2001  . Esophageal dilation  2004  . Vagotomy      There were no vitals filed for this visit.  Visit Diagnosis:  Abnormality of gait  Balance problem      Subjective Assessment - 06/13/14 0755    Subjective Pt. states she fell last Sat. night, 06-03-14 ; went to [facility] ED due to sustaining a scalp laceration, requiring 6 stitches; fell around 2:00 AM - doesn't know why - "just lost my balance and fell"   Patient is accompained by: Family member  spouse   Pertinent History HTN   Diagnostic tests ENG and NCV testing;   Patient Stated Goals improve balance and walking   Currently in Pain? No/denies  no pain at this time - states initially had pain due to fall hitting head                         [facility] Adult PT Treatment/Exercise - 06/13/14 0001    Ambulation/Gait   Ambulation/Gait Yes   Ambulation/Gait Assistance --  CGA    Gait; pt.  gait trained without device 120' x 3 reps with cues to lift R foot for incr. Clearance and incr. Initial heel contact; also cues  given to incr. Step length; step training x 4 steps with use of bil. hand rails with S only - pt. safe with step negotiation  NeuroRe-ed; stepping over and back of 1/2 bolster with min hand held assist x 10 reps each leg; rockerboard x 1 min anterior/ Posterior with UE support with CGA; revised HEP for balance to the following; forward, back, and side kicks x 10 reps each; and SLS Marching in place x 10 reps each and sidestepping 10' x 3 reps with CGA Pt. Performed alternate tap ups to 6" step x 10 reps each with CGA          PT Education - 06/13/14 0758    Education provided Yes   Education Details added forward, back and side kicks; marching and sidestepping to HEP and kept SLS as previously instructed   Person(s) Educated Patient;Spouse   Methods Explanation;Demonstration   Comprehension Verbalized understanding;Returned demonstration          PT Short Term Goals - 06/02/14 1510  PT SHORT TERM GOAL #1   Title Incr. Berg score to >/= 44/56 to reduce fall risk.  (06-24-14)   Baseline 39/56 on 05-25-14                 Time 4   Period Weeks   Status On-going   PT SHORT TERM GOAL #2   Title Improve TUG score to </= 24 secs with cane for improved functional mobility.  (06-24-14)   Baseline 28.71 with cane   Time 4   Period Weeks   Status On-going   PT SHORT TERM GOAL #3   Title Incr. gait velocity to >/= 1.6 ft/sec with cane for incr. gait efficiency.   (06-24-14)   Baseline 1.23 ft/sec with cane   Time 4   Period Weeks   Status On-going   PT SHORT TERM GOAL #4   Title Independent in HEP                (06-24-14)   Time 4   Period Weeks   Status On-going   PT SHORT TERM GOAL #5   Status On-going           PT Long Term Goals - 06/02/14 1510    PT LONG TERM GOAL #1   Title Incr. Berg score to >/= 48/56 to decr. fall risk  (07-24-14)    Baseline 39/56 on 05-25-14   Time 8   Period Weeks   Status On-going   PT LONG TERM GOAL #2   Title Incr. TUG score to </= 20 secs with cane with SBA for improved functional mobility   (07-24-14)   Baseline 28.71 secs with cane   Time 8   Period Weeks   Status On-going   PT LONG TERM GOAL #3   Title Incr. gait velocity to >/= 2.0 ft/sec with cane for incr. gait efficiency.   (07-24-14)   Baseline 1.23 ft/sec with cane   Time 8   Period Weeks   Status On-going   PT LONG TERM GOAL #4   Title Modified independent household amb. without device per pt. report. (07-24-14)   Time 8   Period Weeks   Status On-going   PT LONG TERM GOAL #5   Title Incr. FOTO by at least 10 points for incr. pt satisfaction  (07-24-14)   Baseline to be completed next session   Time 8   Period Weeks   Status On-going               Plan - 06/13/14 0915    Clinical Impression Statement revised HEP for balance exercises due to pt. needing more basic balance exercises to increase ability to perform safely with less UE support and to increase confidence when not using UE support; pt.'s gait pattern improved with verbal cues to lift R foot and incr. initial heel contact; pt. stated at end of session that pt.'s gait looked the best he has seen in several months  Pt will benefit from skilled therapeutic intervention in order to improve on the following deficits Abnormal gait;Decreased range of motion;Impaired flexibility;Decreased balance;Pain;Decreased mobility;Decreased strength   Rehab Potential Good   PT Frequency 2x / week   PT Duration 8 weeks   PT Treatment/Interventions ADLs/Self Care Home Management;Therapeutic activities;Patient/family education;Therapeutic exercise;Gait training;Balance training;Passive range of motion;Stair training;Neuromuscular re-education;Functional mobility training   PT Next Visit  Plan give pics of standing balance execises   PT Home Exercise Plan revised [place]   Consulted and Agree with Plan of Care Patient;Family member/caregiver   Family Member Consulted spouse-Edgar        Problem List Patient Active Problem List   Diagnosis Date Noted  . Gait disorder 04/13/2014  . Eosinophilia 07/15/2013  . Iron deficiency anemia 08/10/2010  . B12 deficiency 08/10/2010    Alda Lea, PT 06/13/2014, 9:20 AM  [facility] [address] [place], [place], [address] Phone: [phone]   Fax:  [phone]

## 2014-06-14 ENCOUNTER — Ambulatory Visit: Payer: Medicare HMO | Admitting: Physical Therapy

## 2014-06-14 DIAGNOSIS — R269 Unspecified abnormalities of gait and mobility: Secondary | ICD-10-CM | POA: Diagnosis not present

## 2014-06-14 DIAGNOSIS — R2689 Other abnormalities of gait and mobility: Secondary | ICD-10-CM

## 2014-06-15 ENCOUNTER — Encounter: Payer: Self-pay | Admitting: Physical Therapy

## 2014-06-15 NOTE — Therapy (Signed)
[facility] [address] [place], [place], [address] Phone: [phone]   Fax:  [phone]  Physical Therapy Treatment  Patient Details  Name: [name] [name] MRN: [id number] Date of Birth: March 29, 1931 Referring Provider:  [name], MD  Encounter Date: 06/14/2014      PT End of Session - 06/15/14 1321    Visit Number 5  G5   Number of Visits 17   Date for PT Re-Evaluation 07/24/14   Authorization Type [employer] Medicare   Authorization Time Period 05-25-14 - 07-24-14   PT Start Time 1017   PT Stop Time 1102   PT Time Calculation (min) 45 min      Past Medical History  Diagnosis Date  . Thyroid disease     hypothyroid  . Cataract     bilateral  . Anemia     iron def  . Iron deficiency anemia 08/10/2010  . B12 deficiency 08/10/2010  . Hypertension   . Gait disorder 04/13/2014    Past Surgical History  Procedure Laterality Date  . Vagotomy  1971  . Abdominal hysterectomy  1975  . Appendectomy  1955  . Cholecystectomy  1978  . Esophageal dilation  2001  . Esophageal dilation  2004  . Vagotomy      There were no vitals filed for this visit.  Visit Diagnosis:  Abnormality of gait  Balance problem      Subjective Assessment - 06/15/14 1318    Subjective Pt. accompanied by her daughter who states she sees a big improvement in pt's balance and gait pattern already - with only a couple of PT sessions received to date   Patient is accompained by: Family member  daughter   Pertinent History HTN   Diagnostic tests ENG and NCV testing;   Patient Stated Goals improve balance and walking   Currently in Pain? No/denies                         [facility] Adult PT Treatment/Exercise - 06/15/14 0001    Ambulation/Gait   Ambulation/Gait Yes   Ambulation/Gait Assistance --  CGA   Ambulation Distance (Feet) 360 Feet   Assistive device --  none   Gait Pattern Right flexed knee in stance;Left  flexed knee in stance;Step-through pattern;Decreased step length - left;Decreased step length - right  pelvis in posterior pelvic tilt due to tight hamstrings   Ambulation Surface Level;Indoor   Posture/Postural Control   Posture Comments posterior pelvic tilt    Knee/Hip Exercises: Stretches   Active Hamstring Stretch 30 seconds  bil. LE's     NeuroRe-ed; single limb stance activities on floor with UE support prn with CGA; marching in place, forwards, and backwards 20' with  Min hand held assist Rockerboard x 1" with UE support prn with CGA Tapping cones with mod hand held assist - tipping over and back up with mod hand held assist Tap ups to 6" step R and LLE x 10 reps each Amb. 100' tossing ball for mulit-tasking with gait - with CGA to min assist TherEx;sit to stand x 10 reps without UE support; heel raises x 10 reps ; bil. Heel cord stretch 30 sec hold x 2 reps R an dLLE           PT Short Term Goals - 06/02/14 1510    PT SHORT TERM GOAL #1   Title Incr. Berg score to >/= 44/56 to reduce fall risk.  (06-24-14)  Baseline 39/56 on 05-25-14                 Time 4   Period Weeks   Status On-going   PT SHORT TERM GOAL #2   Title Improve TUG score to </= 24 secs with cane for improved functional mobility.  (06-24-14)   Baseline 28.71 with cane   Time 4   Period Weeks   Status On-going   PT SHORT TERM GOAL #3   Title Incr. gait velocity to >/= 1.6 ft/sec with cane for incr. gait efficiency.   (06-24-14)   Baseline 1.23 ft/sec with cane   Time 4   Period Weeks   Status On-going   PT SHORT TERM GOAL #4   Title Independent in HEP                (06-24-14)   Time 4   Period Weeks   Status On-going   PT SHORT TERM GOAL #5   Status On-going           PT Long Term Goals - 06/02/14 1510    PT LONG TERM GOAL #1   Title Incr. Berg score to >/= 48/56 to decr. fall risk  (07-24-14)   Baseline 39/56 on 05-25-14   Time 8   Period Weeks   Status On-going   PT LONG TERM GOAL  #2   Title Incr. TUG score to </= 20 secs with cane with SBA for improved functional mobility   (07-24-14)   Baseline 28.71 secs with cane   Time 8   Period Weeks   Status On-going   PT LONG TERM GOAL #3   Title Incr. gait velocity to >/= 2.0 ft/sec with cane for incr. gait efficiency.   (07-24-14)   Baseline 1.23 ft/sec with cane   Time 8   Period Weeks   Status On-going   PT LONG TERM GOAL #4   Title Modified independent household amb. without device per pt. report. (07-24-14)   Time 8   Period Weeks   Status On-going   PT LONG TERM GOAL #5   Title Incr. FOTO by at least 10 points for incr. pt satisfaction  (07-24-14)   Baseline to be completed next session   Time 8   Period Weeks   Status On-going               Plan - 06/15/14 1323    Clinical Impression Statement Pt. progressing well - cont to need cues to relax LUE during gait for incr. arm swing; needs cues to incr. initial heel contact and incr. step length   Pt will benefit from skilled therapeutic intervention in order to improve on the following deficits Abnormal gait;Decreased range of motion;Impaired flexibility;Decreased balance;Pain;Decreased mobility;Decreased strength   Rehab Potential Good   PT Frequency 2x / week   PT Duration 8 weeks   PT Treatment/Interventions ADLs/Self Care Home Management;Therapeutic activities;Patient/family education;Therapeutic exercise;Gait training;Balance training;Passive range of motion;Stair training;Neuromuscular re-education;Functional mobility training   PT Next Visit Plan give pics of standing balance execises   Consulted and Agree with Plan of Care Patient;Family member/caregiver   Family Member Consulted daughter        Problem List Patient Active Problem List   Diagnosis Date Noted  . Gait disorder 04/13/2014  . Eosinophilia 07/15/2013  . Iron deficiency anemia 08/10/2010  . B12 deficiency 08/10/2010    Alda Lea, PT 06/15/2014, 1:27 PM  [facility] [address]  [place], [place], [address] Phone: [phone]   Fax:  [phone]

## 2014-06-19 ENCOUNTER — Ambulatory Visit: Payer: Medicare HMO | Admitting: Physical Therapy

## 2014-06-19 DIAGNOSIS — R2689 Other abnormalities of gait and mobility: Secondary | ICD-10-CM

## 2014-06-19 DIAGNOSIS — R269 Unspecified abnormalities of gait and mobility: Secondary | ICD-10-CM

## 2014-06-19 NOTE — Therapy (Signed)
[facility] [address] [place], [place], [address] Phone: [phone]   Fax:  [phone]  Physical Therapy Treatment  Patient Details  Name: [name] [name] MRN: [id number] Date of Birth: 06/25/1931 Referring Provider:  [name], MD  Encounter Date: 06/19/2014      PT End of Session - 06/19/14 2018    Visit Number 6  G6   Number of Visits 17   Date for PT Re-Evaluation 07/24/14   Authorization Type [employer] Medicare   Authorization Time Period 05-25-14 - 07-24-14   PT Start Time 0935   PT Stop Time 1017   PT Time Calculation (min) 42 min   Equipment Utilized During Treatment Gait belt      Past Medical History  Diagnosis Date  . Thyroid disease     hypothyroid  . Cataract     bilateral  . Anemia     iron def  . Iron deficiency anemia 08/10/2010  . B12 deficiency 08/10/2010  . Hypertension   . Gait disorder 04/13/2014    Past Surgical History  Procedure Laterality Date  . Vagotomy  1971  . Abdominal hysterectomy  1975  . Appendectomy  1955  . Cholecystectomy  1978  . Esophageal dilation  2001  . Esophageal dilation  2004  . Vagotomy      There were no vitals filed for this visit.  Visit Diagnosis:  Abnormality of gait  Balance problem      Subjective Assessment - 06/19/14 2015    Subjective Pt. states she is doing much better - drove herself to therapy today   Pertinent History HTN   Diagnostic tests ENG and NCV testing;   Patient Stated Goals improve balance and walking   Currently in Pain? No/denies                         [facility] Adult PT Treatment/Exercise - 06/19/14 0001    Ambulation/Gait   Ambulation/Gait Yes   Ambulation/Gait Assistance 5: Supervision   Ambulation/Gait Assistance Details cues for L arm swing   Ambulation Distance (Feet) 250 Feet   Assistive device None   Gait Pattern Right flexed knee in stance;Left flexed knee in stance;Step-through  pattern;Decreased step length - left;Decreased step length - right  pelvis in posterior pelvic tilt due to tight hamstrings   Ambulation Surface Level;Indoor   Ankle Exercises: Stretches   Gastroc Stretch 1 rep;30 seconds  bil. LE's     NeuroRe-ed: single limb stance activities on floor - stepping over and back of 1/2 bolster of floor x 10 reps with UE  support prn with CGA;  Tapping cones with CGA and tipping over and standing back upright;  Tap ups x 10 reps Each leg to 6" step; marching on incline x 10 reps each with min assist             PT Short Term Goals - 06/02/14 1510    PT SHORT TERM GOAL #1   Title Incr. Berg score to >/= 44/56 to reduce fall risk.  (06-24-14)   Baseline 39/56 on 05-25-14                 Time 4   Period Weeks   Status On-going   PT SHORT TERM GOAL #2   Title Improve TUG score to </= 24 secs with cane for improved functional mobility.  (06-24-14)   Baseline 28.71 with cane   Time 4  Period Weeks   Status On-going   PT SHORT TERM GOAL #3   Title Incr. gait velocity to >/= 1.6 ft/sec with cane for incr. gait efficiency.   (06-24-14)   Baseline 1.23 ft/sec with cane   Time 4   Period Weeks   Status On-going   PT SHORT TERM GOAL #4   Title Independent in HEP                (06-24-14)   Time 4   Period Weeks   Status On-going   PT SHORT TERM GOAL #5   Status On-going           PT Long Term Goals - 06/02/14 1510    PT LONG TERM GOAL #1   Title Incr. Berg score to >/= 48/56 to decr. fall risk  (07-24-14)   Baseline 39/56 on 05-25-14   Time 8   Period Weeks   Status On-going   PT LONG TERM GOAL #2   Title Incr. TUG score to </= 20 secs with cane with SBA for improved functional mobility   (07-24-14)   Baseline 28.71 secs with cane   Time 8   Period Weeks   Status On-going   PT LONG TERM GOAL #3   Title Incr. gait velocity to >/= 2.0 ft/sec with cane for incr. gait efficiency.   (07-24-14)   Baseline 1.23 ft/sec with cane   Time 8    Period Weeks   Status On-going   PT LONG TERM GOAL #4   Title Modified independent household amb. without device per pt. report. (07-24-14)   Time 8   Period Weeks   Status On-going   PT LONG TERM GOAL #5   Title Incr. FOTO by at least 10 points for incr. pt satisfaction  (07-24-14)   Baseline to be completed next session   Time 8   Period Weeks   Status On-going               Plan - 06/19/14 2018    Clinical Impression Statement Pt. improving with dynamic balance and gait - incr. L arm swing noted; pt. feeling more confident   Pt will benefit from skilled therapeutic intervention in order to improve on the following deficits Abnormal gait;Decreased range of motion;Impaired flexibility;Decreased balance;Pain;Decreased mobility;Decreased strength   Rehab Potential Good   PT Frequency 2x / week   PT Duration 8 weeks   PT Treatment/Interventions ADLs/Self Care Home Management;Therapeutic activities;Patient/family education;Therapeutic exercise;Gait training;Balance training;Passive range of motion;Stair training;Neuromuscular re-education;Functional mobility training   PT Next Visit Plan give pics of standing balance execises   Consulted and Agree with Plan of Care Patient        Problem List Patient Active Problem List   Diagnosis Date Noted  . Gait disorder 04/13/2014  . Eosinophilia 07/15/2013  . Iron deficiency anemia 08/10/2010  . B12 deficiency 08/10/2010    Alda Lea, PT 06/19/2014, 8:21 PM  [facility] [address] [place], [place], [address] Phone: [phone]   Fax:  [phone]

## 2014-06-21 ENCOUNTER — Ambulatory Visit: Payer: Medicare HMO | Admitting: Physical Therapy

## 2014-06-21 DIAGNOSIS — R269 Unspecified abnormalities of gait and mobility: Secondary | ICD-10-CM | POA: Diagnosis not present

## 2014-06-21 DIAGNOSIS — R2689 Other abnormalities of gait and mobility: Secondary | ICD-10-CM

## 2014-06-22 ENCOUNTER — Encounter: Payer: Self-pay | Admitting: Physical Therapy

## 2014-06-22 NOTE — Therapy (Signed)
[facility] [address] [place], [place], [address] Phone: [phone]   Fax:  [phone]  Physical Therapy Treatment  Patient Details  Name: [name] [name] MRN: [id number] Date of Birth: 1931/04/04 Referring Provider:  [name], MD  Encounter Date: 06/21/2014      PT End of Session - 06/22/14 1944    Visit Number 7  G7   Number of Visits 17   Date for PT Re-Evaluation 07/24/14   Authorization Type [employer]na Medicare   Authorization Time Period 05-25-14 - 07-24-14   PT Start Time 1104   PT Stop Time 1148   PT Time Calculation (min) 44 min   Equipment Utilized During Treatment Gait belt      Past Medical History  Diagnosis Date  . Thyroid disease     hypothyroid  . Cataract     bilateral  . Anemia     iron def  . Iron deficiency anemia 08/10/2010  . B12 deficiency 08/10/2010  . Hypertension   . Gait disorder 04/13/2014    Past Surgical History  Procedure Laterality Date  . Vagotomy  1971  . Abdominal hysterectomy  1975  . Appendectomy  1955  . Cholecystectomy  1978  . Esophageal dilation  2001  . Esophageal dilation  2004  . Vagotomy      There were no vitals filed for this visit.  Visit Diagnosis:  Abnormality of gait  Balance problem      Subjective Assessment - 06/22/14 1303    Subjective Pt. states that she is walking much better   Patient is accompained by: Family member   Pertinent History HTN   Diagnostic tests ENG and NCV testing;   Patient Stated Goals improve balance and walking   Currently in Pain? No/denies                         [facility] Adult PT Treatment/Exercise - 06/22/14 0001    Ambulation/Gait   Ambulation/Gait Yes   Ambulation/Gait Assistance 5: Supervision   Ambulation/Gait Assistance Details cues for incr. step length and to relax L arm   Ambulation Distance (Feet) 360 Feet   Assistive device --  None   Gait Pattern Within Functional Limits   Ambulation Surface Level;Indoor   Berg Balance Test   Sit to Stand Able to stand without using hands and stabilize independently   Standing Unsupported Able to stand safely 2 minutes   Sitting with Back Unsupported but Feet Supported on Floor or Stool Able to sit safely and securely 2 minutes   Stand to Sit Sits safely with minimal use of hands   Transfers Able to transfer safely, minor use of hands   Standing Unsupported with Eyes Closed Able to stand 10 seconds safely   Standing Ubsupported with Feet Together Able to place feet together independently and stand for 1 minute with supervision   From Standing, Reach Forward with Outstretched Arm Can reach confidently >25 cm (10")   From Standing Position, Pick up Object from Floor Able to pick up shoe safely and easily   From Standing Position, Turn to Look Behind Over each Shoulder Looks behind from both sides and weight shifts well   Turn 360 Degrees Able to turn 360 degrees safely but slowly  L= 6.84  R=8.47 secs   Standing Unsupported, Alternately Place Feet on Step/Stool Able to complete >2 steps/needs minimal assist   Standing Unsupported, One Foot in Front Able to plae  foot ahead of the other independently and hold 30 seconds   Standing on One Leg Tries to lift leg/unable to hold 3 seconds but remains standing independently   Total Score 46   Knee/Hip Exercises: Stretches   Active Hamstring Stretch 30 seconds  bil. LE's   Ankle Exercises: Stretches   Gastroc Stretch 1 rep;30 seconds  bil. LE's                PT Education - 06/22/14 1943    Education provided Yes   Education Details added kicks (3 directions), marching and sidestepping and single limb stance   Person(s) Educated Patient   Methods Explanation;Demonstration;Handout   Comprehension Verbalized understanding;Returned demonstration          PT Short Term Goals - 06/22/14 1945    PT SHORT TERM GOAL #1   Title Incr. Berg score to >/= 44/56 to reduce fall  risk.  (06-24-14)   Baseline 46/56 06-21-14   Time 4   Period Weeks   Status Achieved   PT SHORT TERM GOAL #2   Title Improve TUG score to </= 24 secs with cane for improved functional mobility.  (06-24-14)   Baseline 18.34 secs with cane;   17.87 secs without cane   Status Achieved   PT SHORT TERM GOAL #3   Title Incr. gait velocity to >/= 1.6 ft/sec with cane for incr. gait efficiency.   (06-24-14)   Baseline 16.89 secs   1.94   Status Achieved   PT SHORT TERM GOAL #4   Title Independent in HEP                (06-24-14)   Status Achieved           PT Long Term Goals - 06/02/14 1510    PT LONG TERM GOAL #1   Title Incr. Berg score to >/= 48/56 to decr. fall risk  (07-24-14)   Baseline 39/56 on 05-25-14   Time 8   Period Weeks   Status On-going   PT LONG TERM GOAL #2   Title Incr. TUG score to </= 20 secs with cane with SBA for improved functional mobility   (07-24-14)   Baseline 28.71 secs with cane   Time 8   Period Weeks   Status On-going   PT LONG TERM GOAL #3   Title Incr. gait velocity to >/= 2.0 ft/sec with cane for incr. gait efficiency.   (07-24-14)   Baseline 1.23 ft/sec with cane   Time 8   Period Weeks   Status On-going   PT LONG TERM GOAL #4   Title Modified independent household amb. without device per pt. report. (07-24-14)   Time 8   Period Weeks   Status On-going   PT LONG TERM GOAL #5   Title Incr. FOTO by at least 10 points for incr. pt satisfaction  (07-24-14)   Baseline to be completed next session   Time 8   Period Weeks   Status On-going               Plan - 06/22/14 1949    Clinical Impression Statement Pt. met 4/4 STG's - progressing well and much quicker than initially anticipated   Pt will benefit from skilled therapeutic intervention in order to improve on the following deficits Abnormal gait;Decreased range of motion;Impaired flexibility;Decreased balance;Pain;Decreased mobility;Decreased strength   Rehab Potential Good   PT  Frequency 2x / week   PT Duration 8 weeks   PT Treatment/Interventions  ADLs/Self Care Home Management;Therapeutic activities;Patient/family education;Therapeutic exercise;Gait training;Balance training;Passive range of motion;Stair training;Neuromuscular re-education;Functional mobility training   PT Next Visit Plan check standing balance exercises   Consulted and Agree with Plan of Care Patient        Problem List Patient Active Problem List   Diagnosis Date Noted  . Gait disorder 04/13/2014  . Eosinophilia 07/15/2013  . Iron deficiency anemia 08/10/2010  . B12 deficiency 08/10/2010    Alda Lea, PT 06/22/2014, 7:52 PM  [facility] [address] [place], [place], [address] Phone: [phone]   Fax:  [phone]

## 2014-06-22 NOTE — Patient Instructions (Signed)
Flexion and Extension - Standing   Stand and support self while swinging uninvolved leg and hip forward and backward ___ times. Repeat with involved leg and hip. Do ___ times per day.  Copyright  VHI. All rights reserved.

## 2014-06-26 ENCOUNTER — Ambulatory Visit: Payer: Medicare HMO | Admitting: Physical Therapy

## 2014-06-26 DIAGNOSIS — R2689 Other abnormalities of gait and mobility: Secondary | ICD-10-CM

## 2014-06-26 DIAGNOSIS — R269 Unspecified abnormalities of gait and mobility: Secondary | ICD-10-CM

## 2014-06-27 ENCOUNTER — Encounter: Payer: Self-pay | Admitting: Physical Therapy

## 2014-06-27 NOTE — Therapy (Signed)
[facility] [address] [place], [place], [address] Phone: [phone]   Fax:  [phone]  Physical Therapy Treatment  Patient Details  Name: [name] [name] MRN: [id number] Date of Birth: 02-06-1931 Referring Provider:  [name], MD  Encounter Date: 06/26/2014      PT End of Session - 06/27/14 2206    Visit Number 8  G8   Number of Visits 17   Date for PT Re-Evaluation 07/24/14   Authorization Type [employer]na Medicare   Authorization Time Period 05-25-14 - 07-24-14   PT Start Time 1448   PT Stop Time 1530   PT Time Calculation (min) 42 min   Equipment Utilized During Treatment Gait belt      Past Medical History  Diagnosis Date  . Thyroid disease     hypothyroid  . Cataract     bilateral  . Anemia     iron def  . Iron deficiency anemia 08/10/2010  . B12 deficiency 08/10/2010  . Hypertension   . Gait disorder 04/13/2014    Past Surgical History  Procedure Laterality Date  . Vagotomy  1971  . Abdominal hysterectomy  1975  . Appendectomy  1955  . Cholecystectomy  1978  . Esophageal dilation  2001  . Esophageal dilation  2004  . Vagotomy      There were no vitals filed for this visit.  Visit Diagnosis:  Abnormality of gait  Balance problem      Subjective Assessment - 06/27/14 2204    Subjective "I'm doing much better"   Patient Stated Goals improve balance and walking   Currently in Pain? No/denies                         [facility] Adult PT Treatment/Exercise - 06/27/14 0001    Ambulation/Gait   Ambulation/Gait Yes   Ambulation/Gait Assistance 5: Supervision   Ambulation Distance (Feet) 375 Feet   Assistive device --  None   Gait Pattern Within Functional Limits   Knee/Hip Exercises: Stretches   Active Hamstring Stretch 30 seconds  bil. LE's   Ankle Exercises: Stretches   Gastroc Stretch 1 rep;30 seconds  bil. LE's             Balance Exercises - 06/27/14  2205    Balance Exercises: Standing   Rockerboard Anterior/posterior;30 seconds;Intermittent UE support   Step Ups Forward;6 inch;UE support 2   Sidestepping Upper extremity support;2 reps  in bars   [employer] Rotation Foam/compliant surface;Right turn;Left turn  CGA             PT Short Term Goals - 06/22/14 1945    PT SHORT TERM GOAL #1   Title Incr. Berg score to >/= 44/56 to reduce fall risk.  (06-24-14)   Baseline 46/56 06-21-14   Time 4   Period Weeks   Status Achieved   PT SHORT TERM GOAL #2   Title Improve TUG score to </= 24 secs with cane for improved functional mobility.  (06-24-14)   Baseline 18.34 secs with cane;   17.87 secs without cane   Status Achieved   PT SHORT TERM GOAL #3   Title Incr. gait velocity to >/= 1.6 ft/sec with cane for incr. gait efficiency.   (06-24-14)   Baseline 16.89 secs   1.94   Status Achieved   PT SHORT TERM GOAL #4   Title Independent in HEP                (  06-24-14)   Status Achieved           PT Long Term Goals - 06/02/14 1510    PT LONG TERM GOAL #1   Title Incr. Berg score to >/= 48/56 to decr. fall risk  (07-24-14)   Baseline 39/56 on 05-25-14   Time 8   Period Weeks   Status On-going   PT LONG TERM GOAL #2   Title Incr. TUG score to </= 20 secs with cane with SBA for improved functional mobility   (07-24-14)   Baseline 28.71 secs with cane   Time 8   Period Weeks   Status On-going   PT LONG TERM GOAL #3   Title Incr. gait velocity to >/= 2.0 ft/sec with cane for incr. gait efficiency.   (07-24-14)   Baseline 1.23 ft/sec with cane   Time 8   Period Weeks   Status On-going   PT LONG TERM GOAL #4   Title Modified independent household amb. without device per pt. report. (07-24-14)   Time 8   Period Weeks   Status On-going   PT LONG TERM GOAL #5   Title Incr. FOTO by at least 10 points for incr. pt satisfaction  (07-24-14)   Baseline to be completed next session   Time 8   Period Weeks   Status On-going                Plan - 06/27/14 2207    Clinical Impression Statement Pt progressing well towards LTG's   Pt will benefit from skilled therapeutic intervention in order to improve on the following deficits Abnormal gait;Decreased range of motion;Impaired flexibility;Decreased balance;Pain;Decreased mobility;Decreased strength   Rehab Potential Good   PT Frequency 2x / week   PT Duration 8 weeks   PT Treatment/Interventions ADLs/Self Care Home Management;Therapeutic activities;Patient/family education;Therapeutic exercise;Gait training;Balance training;Passive range of motion;Stair training;Neuromuscular re-education;Functional mobility training   PT Next Visit Plan standing balance   Consulted and Agree with Plan of Care Patient        Problem List Patient Active Problem List   Diagnosis Date Noted  . Gait disorder 04/13/2014  . Eosinophilia 07/15/2013  . Iron deficiency anemia 08/10/2010  . B12 deficiency 08/10/2010    Alda Lea, PT 06/27/2014, 10:09 PM  [facility] [address] [place], [place], [address] Phone: [phone]   Fax:  [phone]

## 2014-06-29 ENCOUNTER — Ambulatory Visit: Payer: Medicare HMO | Admitting: Physical Therapy

## 2014-06-29 DIAGNOSIS — R269 Unspecified abnormalities of gait and mobility: Secondary | ICD-10-CM | POA: Diagnosis not present

## 2014-06-29 DIAGNOSIS — R2689 Other abnormalities of gait and mobility: Secondary | ICD-10-CM

## 2014-06-30 ENCOUNTER — Encounter: Payer: Self-pay | Admitting: Physical Therapy

## 2014-06-30 NOTE — Therapy (Signed)
[facility] [address] [place], [place], [address] Phone: [phone]   Fax:  [phone]  Physical Therapy Treatment  Patient Details  Name: [name] [name] MRN: [id number] Date of Birth: 07/07/1931 Referring Provider:  [name], MD  Encounter Date: 06/29/2014      PT End of Session - 06/30/14 0805    Visit Number 9   Number of Visits 17   Date for PT Re-Evaluation 07/24/14   Authorization Type [employer] Medicare   Authorization Time Period 05-25-14 - 07-24-14   PT Start Time 1316   PT Stop Time 1402   PT Time Calculation (min) 46 min      Past Medical History  Diagnosis Date  . Thyroid disease     hypothyroid  . Cataract     bilateral  . Anemia     iron def  . Iron deficiency anemia 08/10/2010  . B12 deficiency 08/10/2010  . Hypertension   . Gait disorder 04/13/2014    Past Surgical History  Procedure Laterality Date  . Vagotomy  1971  . Abdominal hysterectomy  1975  . Appendectomy  1955  . Cholecystectomy  1978  . Esophageal dilation  2001  . Esophageal dilation  2004  . Vagotomy      There were no vitals filed for this visit.  Visit Diagnosis:  Abnormality of gait  Balance problem      Subjective Assessment - 06/30/14 0803    Subjective Pt reports no problems and no changes; has not had any falls   Pertinent History HTN   Patient Stated Goals improve balance and walking   Currently in Pain? No/denies                         [facility] Adult PT Treatment/Exercise - 06/30/14 0001    Ambulation/Gait   Ambulation/Gait Yes   Ambulation/Gait Assistance 5: Supervision   Ambulation/Gait Assistance Details cues for incr. step length and incr. L arm swing   Ambulation Distance (Feet) 250 Feet   Assistive device --  none   Gait Pattern Within Functional Limits   Ambulation Surface Level;Indoor   Knee/Hip Exercises: Stretches   Active Hamstring Stretch 30 seconds  bil. LE's    Ankle Exercises: Stretches   Gastroc Stretch 1 rep;30 seconds  bil. LE's      NeuroRe-ed; single limb stance activities on floor and on compliant surfaces with min to CGA            PT Short Term Goals - 06/22/14 1945    PT SHORT TERM GOAL #1   Title Incr. Berg score to >/= 44/56 to reduce fall risk.  (06-24-14)   Baseline 46/56 06-21-14   Time 4   Period Weeks   Status Achieved   PT SHORT TERM GOAL #2   Title Improve TUG score to </= 24 secs with cane for improved functional mobility.  (06-24-14)   Baseline 18.34 secs with cane;   17.87 secs without cane   Status Achieved   PT SHORT TERM GOAL #3   Title Incr. gait velocity to >/= 1.6 ft/sec with cane for incr. gait efficiency.   (06-24-14)   Baseline 16.89 secs   1.94   Status Achieved   PT SHORT TERM GOAL #4   Title Independent in HEP                (06-24-14)   Status Achieved  PT Long Term Goals - 06/02/14 1510    PT LONG TERM GOAL #1   Title Incr. Berg score to >/= 48/56 to decr. fall risk  (07-24-14)   Baseline 39/56 on 05-25-14   Time 8   Period Weeks   Status On-going   PT LONG TERM GOAL #2   Title Incr. TUG score to </= 20 secs with cane with SBA for improved functional mobility   (07-24-14)   Baseline 28.71 secs with cane   Time 8   Period Weeks   Status On-going   PT LONG TERM GOAL #3   Title Incr. gait velocity to >/= 2.0 ft/sec with cane for incr. gait efficiency.   (07-24-14)   Baseline 1.23 ft/sec with cane   Time 8   Period Weeks   Status On-going   PT LONG TERM GOAL #4   Title Modified independent household amb. without device per pt. report. (07-24-14)   Time 8   Period Weeks   Status On-going   PT LONG TERM GOAL #5   Title Incr. FOTO by at least 10 points for incr. pt satisfaction  (07-24-14)   Baseline to be completed next session   Time 8   Period Weeks   Status On-going               Plan - 06/30/14 0807    Clinical Impression Statement Pt progressing well towards  LTG's - carrying cane and not truly using for assistance with ambulation   Pt will benefit from skilled therapeutic intervention in order to improve on the following deficits Abnormal gait;Decreased range of motion;Impaired flexibility;Decreased balance;Pain;Decreased mobility;Decreased strength   Rehab Potential Good   PT Frequency 2x / week   PT Duration 8 weeks   PT Treatment/Interventions ADLs/Self Care Home Management;Therapeutic activities;Patient/family education;Therapeutic exercise;Gait training;Balance training;Passive range of motion;Stair training;Neuromuscular re-education;Functional mobility training   PT Next Visit Plan standing balance   Consulted and Agree with Plan of Care Patient        Problem List Patient Active Problem List   Diagnosis Date Noted  . Gait disorder 04/13/2014  . Eosinophilia 07/15/2013  . Iron deficiency anemia 08/10/2010  . B12 deficiency 08/10/2010    Alda Lea, PT 06/30/2014, 8:11 AM  [facility] [address] [place], [place], [address] Phone: [phone]   Fax:  [phone]

## 2014-07-04 ENCOUNTER — Ambulatory Visit: Payer: Medicare HMO | Admitting: Physical Therapy

## 2014-07-04 DIAGNOSIS — R269 Unspecified abnormalities of gait and mobility: Secondary | ICD-10-CM | POA: Diagnosis not present

## 2014-07-04 DIAGNOSIS — R2689 Other abnormalities of gait and mobility: Secondary | ICD-10-CM

## 2014-07-05 ENCOUNTER — Encounter: Payer: Self-pay | Admitting: Physical Therapy

## 2014-07-05 NOTE — Therapy (Signed)
[facility] [address] [place], [place], [address] Phone: [phone]   Fax:  [phone]  Physical Therapy Treatment  Patient Details  Name: [name] [name] MRN: [id number] Date of Birth: 1931/12/11 Referring Provider:  [name], MD  Encounter Date: 07/04/2014      PT End of Session - 07/05/14 0825    Visit Number 10  G10   Number of Visits 17   Date for PT Re-Evaluation 07/24/14   Authorization Type [employer]na Medicare   PT Start Time 1016   PT Stop Time 1100   PT Time Calculation (min) 44 min   Equipment Utilized During Treatment Gait belt      Past Medical History  Diagnosis Date  . Thyroid disease     hypothyroid  . Cataract     bilateral  . Anemia     iron def  . Iron deficiency anemia 08/10/2010  . B12 deficiency 08/10/2010  . Hypertension   . Gait disorder 04/13/2014    Past Surgical History  Procedure Laterality Date  . Vagotomy  1971  . Abdominal hysterectomy  1975  . Appendectomy  1955  . Cholecystectomy  1978  . Esophageal dilation  2001  . Esophageal dilation  2004  . Vagotomy      There were no vitals filed for this visit.  Visit Diagnosis:  Abnormality of gait  Balance problem      Subjective Assessment - 07/05/14 0820    Subjective Pt. reports no problems - continues to do well with ambulation at home   Pertinent History HTN   Diagnostic tests ENG and NCV testing;   Patient Stated Goals improve balance and walking   Currently in Pain? No/denies                         [facility] Adult PT Treatment/Exercise - 07/05/14 0001    Ambulation/Gait   Ambulation/Gait Yes   Ambulation/Gait Assistance 5: Supervision  CGA on grass   Ambulation Distance (Feet) 200 Feet  outside- appr. 125' on pavement, 53' on grass   Assistive device --  none   Gait Pattern Decreased stride length  decr. initial heel contact at times   Ambulation Surface Outdoor;Paved;Grass   Stairs Yes   Stairs Assistance 5: Supervision   Stair Management Technique Forwards;Alternating pattern;One rail Right   Number of Stairs 4   Height of Stairs 6   Ankle Exercises: Stretches   Gastroc Stretch 1 rep;30 seconds  bil. LE's     TherEx; sit to stand x 5 reps no UE support; leg press 55# 2 sets 10 reps bil. LE's        Balance Exercises - 07/05/14 0824    Balance Exercises: Standing   Rockerboard Anterior/posterior;30 seconds;Intermittent UE support   Step Ups Forward;6 inch;UE support 2   Sidestepping Upper extremity support;2 reps  in bars     Stepping over and back of line on floor without UE support with SBA x 10 reps each; marching on compliant surface with SBA without UE support         PT Short Term Goals - 06/22/14 1945    PT SHORT TERM GOAL #1   Title Incr. Berg score to >/= 44/56 to reduce fall risk.  (06-24-14)   Baseline 46/56 06-21-14   Time 4   Period Weeks   Status Achieved   PT SHORT TERM GOAL #2   Title Improve TUG score to </=  24 secs with cane for improved functional mobility.  (06-24-14)   Baseline 18.34 secs with cane;   17.87 secs without cane   Status Achieved   PT SHORT TERM GOAL #3   Title Incr. gait velocity to >/= 1.6 ft/sec with cane for incr. gait efficiency.   (06-24-14)   Baseline 16.89 secs   1.94   Status Achieved   PT SHORT TERM GOAL #4   Title Independent in HEP                (06-24-14)   Status Achieved           PT Long Term Goals - 06/02/14 1510    PT LONG TERM GOAL #1   Title Incr. Berg score to >/= 48/56 to decr. fall risk  (07-24-14)   Baseline 39/56 on 05-25-14   Time 8   Period Weeks   Status On-going   PT LONG TERM GOAL #2   Title Incr. TUG score to </= 20 secs with cane with SBA for improved functional mobility   (07-24-14)   Baseline 28.71 secs with cane   Time 8   Period Weeks   Status On-going   PT LONG TERM GOAL #3   Title Incr. gait velocity to >/= 2.0 ft/sec with cane for incr. gait efficiency.    (07-24-14)   Baseline 1.23 ft/sec with cane   Time 8   Period Weeks   Status On-going   PT LONG TERM GOAL #4   Title Modified independent household amb. without device per pt. report. (07-24-14)   Time 8   Period Weeks   Status On-going   PT LONG TERM GOAL #5   Title Incr. FOTO by at least 10 points for incr. pt satisfaction  (07-24-14)   Baseline to be completed next session   Time 8   Period Weeks   Status On-going               Plan - 07/05/14 5465    Clinical Impression Statement Pt. cont to progress well - pt. did not heavily rely on cane for assistance with outside ambulation; no LOB occurred with amb. on grass   Pt will benefit from skilled therapeutic intervention in order to improve on the following deficits Abnormal gait;Decreased range of motion;Impaired flexibility;Decreased balance;Pain;Decreased mobility;Decreased strength   Rehab Potential Good   PT Frequency 2x / week   PT Duration 8 weeks   PT Treatment/Interventions ADLs/Self Care Home Management;Therapeutic activities;Patient/family education;Therapeutic exercise;Gait training;Balance training;Passive range of motion;Stair training;Neuromuscular re-education;Functional mobility training   PT Next Visit Plan standing balance   PT Home Exercise Plan balance and LE stretching   Consulted and Agree with Plan of Care Patient          G-Codes - 2014/07/05 6812    Functional Assessment Tool Used Merrilee Jansky score 46/56;  TUG score 17.87 secs:  gait velocity 1.94 ft/sec with no device   Functional Limitation Mobility: Walking and moving around   Mobility: Walking and Moving Around Current Status [phone]) At least 20 percent but less than 40 percent impaired, limited or restricted   Mobility: Walking and Moving Around Goal Status [phone]) At least 20 percent but less than 40 percent impaired, limited or restricted      Problem List Patient Active Problem List   Diagnosis Date Noted  . Gait disorder 04/13/2014  .  Eosinophilia 07/15/2013  . Iron deficiency anemia 08/10/2010  . B12 deficiency 08/10/2010    Rosielee Corporan, [place], PT  07/05/2014, 8:32 AM  [facility] [address] [place], [place], [address] Phone: [phone]   Fax:  [phone]

## 2014-07-06 ENCOUNTER — Ambulatory Visit: Payer: Medicare HMO | Attending: Internal Medicine | Admitting: Physical Therapy

## 2014-07-06 DIAGNOSIS — R29818 Other symptoms and signs involving the nervous system: Secondary | ICD-10-CM | POA: Diagnosis present

## 2014-07-06 DIAGNOSIS — R269 Unspecified abnormalities of gait and mobility: Secondary | ICD-10-CM | POA: Insufficient documentation

## 2014-07-06 DIAGNOSIS — R2689 Other abnormalities of gait and mobility: Secondary | ICD-10-CM

## 2014-07-07 ENCOUNTER — Encounter: Payer: Self-pay | Admitting: Physical Therapy

## 2014-07-07 NOTE — Therapy (Signed)
[facility] [address] [place], [place], [address] Phone: [phone]   Fax:  [phone]  Physical Therapy Treatment  Patient Details  Name: [name] [name] MRN: [id number] Date of Birth: 07-17-1931 Referring Provider:  [name], MD  Encounter Date: 07/06/2014      PT End of Session - 07/07/14 0950    Visit Number 11  G1   Number of Visits 17   Date for PT Re-Evaluation 07/24/14   Authorization Type [employer]na Medicare   Authorization Time Period 05-25-14 - 07-24-14   PT Start Time 1449   PT Stop Time 1536   PT Time Calculation (min) 47 min      Past Medical History  Diagnosis Date  . Thyroid disease     hypothyroid  . Cataract     bilateral  . Anemia     iron def  . Iron deficiency anemia 08/10/2010  . B12 deficiency 08/10/2010  . Hypertension   . Gait disorder 04/13/2014    Past Surgical History  Procedure Laterality Date  . Vagotomy  1971  . Abdominal hysterectomy  1975  . Appendectomy  1955  . Cholecystectomy  1978  . Esophageal dilation  2001  . Esophageal dilation  2004  . Vagotomy      There were no vitals filed for this visit.  Visit Diagnosis:  Abnormality of gait  Balance problem      Subjective Assessment - 07/07/14 0947    Subjective Pt. states she is doing better - doesn't really use cane - carries it for security   Pertinent History HTN   Patient Stated Goals improve balance and walking   Currently in Pain? No/denies                         [facility] Adult PT Treatment/Exercise - 07/07/14 0001    Ambulation/Gait   Ambulation/Gait Yes   Ambulation/Gait Assistance 5: Supervision  CGA on grass   Ambulation Distance (Feet) 225 Feet   Assistive device None   Gait Pattern Decreased stride length  decr. initial heel contact at times   Ambulation Surface Level;Indoor   Stairs Yes   Stairs Assistance 5: Supervision   Stair Management Technique  Forwards;Alternating pattern;One rail Right   Number of Stairs 4   Height of Stairs 6   Ramp 5: Supervision   Curb 5: Supervision  CGA   Curb Details (indicate cue type and reason) needs cues for correct foot positioning with descension   Knee/Hip Exercises: Stretches   Active Hamstring Stretch 30 seconds  bil. LE's   Ankle Exercises: Stretches   Gastroc Stretch 1 rep;30 seconds  bil. LE's    Leg Press 55# bil. LE's 2 sets 10 reps   NeuroRe-ed; single limb stance activities on compliant surfaces with min to mod assist - no device used; Tap ups to 6" step 10 reps each LE without UE support  Rockerboard inside bars with UE support prn with SBA anterior/posteriorly x 1"     Balance Exercises - 07/07/14 0949    Balance Exercises: Standing   Rockerboard Anterior/posterior;30 seconds;Intermittent UE support   Step Ups Forward;6 inch;UE support 2             PT Short Term Goals - 06/22/14 1945    PT SHORT TERM GOAL #1   Title Incr. Berg score to >/= 44/56 to reduce fall risk.  (06-24-14)   Baseline 46/56 06-21-14   Time 4  Period Weeks   Status Achieved   PT SHORT TERM GOAL #2   Title Improve TUG score to </= 24 secs with cane for improved functional mobility.  (06-24-14)   Baseline 18.34 secs with cane;   17.87 secs without cane   Status Achieved   PT SHORT TERM GOAL #3   Title Incr. gait velocity to >/= 1.6 ft/sec with cane for incr. gait efficiency.   (06-24-14)   Baseline 16.89 secs   1.94   Status Achieved   PT SHORT TERM GOAL #4   Title Independent in HEP                (06-24-14)   Status Achieved           PT Long Term Goals - 06/02/14 1510    PT LONG TERM GOAL #1   Title Incr. Berg score to >/= 48/56 to decr. fall risk  (07-24-14)   Baseline 39/56 on 05-25-14   Time 8   Period Weeks   Status On-going   PT LONG TERM GOAL #2   Title Incr. TUG score to </= 20 secs with cane with SBA for improved functional mobility   (07-24-14)   Baseline 28.71 secs with  cane   Time 8   Period Weeks   Status On-going   PT LONG TERM GOAL #3   Title Incr. gait velocity to >/= 2.0 ft/sec with cane for incr. gait efficiency.   (07-24-14)   Baseline 1.23 ft/sec with cane   Time 8   Period Weeks   Status On-going   PT LONG TERM GOAL #4   Title Modified independent household amb. without device per pt. report. (07-24-14)   Time 8   Period Weeks   Status On-going   PT LONG TERM GOAL #5   Title Incr. FOTO by at least 10 points for incr. pt satisfaction  (07-24-14)   Baseline to be completed next session   Time 8   Period Weeks   Status On-going               Plan - 07/07/14 0951    Clinical Impression Statement Pt. progressing well - heel lift placed in L shoe due to RLE longer in leg length ; gait improved and pt states she feels better with it in shoe   Rehab Potential Good   PT Frequency 2x / week   PT Duration 8 weeks   PT Treatment/Interventions ADLs/Self Care Home Management;Therapeutic activities;Patient/family education;Therapeutic exercise;Gait training;Balance training;Passive range of motion;Stair training;Neuromuscular re-education;Functional mobility training   PT Next Visit Plan standing balance; ask about heel lift   PT Home Exercise Plan balance and LE stretching   Consulted and Agree with Plan of Care Patient        Problem List Patient Active Problem List   Diagnosis Date Noted  . Gait disorder 04/13/2014  . Eosinophilia 07/15/2013  . Iron deficiency anemia 08/10/2010  . B12 deficiency 08/10/2010    Alda Lea, PT 07/07/2014, 9:55 AM  [facility] [address] [place], [place], [address] Phone: [phone]   Fax:  [phone]

## 2014-07-10 ENCOUNTER — Ambulatory Visit: Payer: Medicare HMO | Admitting: Physical Therapy

## 2014-07-10 ENCOUNTER — Encounter: Payer: Self-pay | Admitting: Physical Therapy

## 2014-07-10 DIAGNOSIS — R269 Unspecified abnormalities of gait and mobility: Secondary | ICD-10-CM | POA: Diagnosis not present

## 2014-07-10 DIAGNOSIS — R2689 Other abnormalities of gait and mobility: Secondary | ICD-10-CM

## 2014-07-10 NOTE — Therapy (Signed)
[facility] [address] [place], [place], [address] Phone: [phone]   Fax:  [phone]  Physical Therapy Treatment  Patient Details  Name: [name] [name] MRN: [id number] Date of Birth: [birth year] Referring Provider:  [name], MD  Encounter Date: 07/10/2014      PT End of Session - 07/10/14 1511    Visit Number 12   Number of Visits 17   Date for PT Re-Evaluation 07/24/14   Authorization Type [employer]na Medicare   Authorization Time Period 05-25-14 - 07-24-14   PT Start Time 1315   PT Stop Time 1405   PT Time Calculation (min) 50 min   Equipment Utilized During Treatment Gait belt      Past Medical History  Diagnosis Date  . Thyroid disease     hypothyroid  . Cataract     bilateral  . Anemia     iron def  . Iron deficiency anemia 08/10/2010  . B12 deficiency 08/10/2010  . Hypertension   . Gait disorder 04/13/2014    Past Surgical History  Procedure Laterality Date  . Vagotomy  1971  . Abdominal hysterectomy  1975  . Appendectomy  1955  . Cholecystectomy  1978  . Esophageal dilation  2001  . Esophageal dilation  2004  . Vagotomy      There were no vitals filed for this visit.  Visit Diagnosis:  Abnormality of gait  Balance problem      Subjective Assessment - 07/10/14 1504    Subjective Pt. reports she is doing much better - feels she is ready for D/C   Pertinent History HTN   Diagnostic tests ENG and NCV testing;   Patient Stated Goals improve balance and walking   Currently in Pain? No/denies                         [facility] Adult PT Treatment/Exercise - 07/10/14 1318    Ambulation/Gait   Ambulation/Gait Yes   Ambulation/Gait Assistance 6: Modified independent (Device/Increase time)   Ambulation Distance (Feet) 125 Feet   Assistive device None   Gait Pattern Decreased stride length  decr. initial heel contact at times   Ambulation Surface Level;Indoor   Gait  velocity 2.45   Stairs Yes   Stairs Assistance 5: Supervision   Stair Management Technique No rails;Forwards  with SBA and cues for positioning of feet during descension   Number of Stairs 4   Height of Stairs 6   Berg Balance Test   Sit to Stand Able to stand without using hands and stabilize independently   Standing Unsupported Able to stand safely 2 minutes   Sitting with Back Unsupported but Feet Supported on Floor or Stool Able to sit safely and securely 2 minutes   Stand to Sit Sits safely with minimal use of hands   Transfers Able to transfer safely, minor use of hands   Standing Unsupported with Eyes Closed Able to stand 10 seconds safely   Standing Ubsupported with Feet Together Able to place feet together independently and stand 1 minute safely   From Standing, Reach Forward with Outstretched Arm Can reach confidently >25 cm (10")   From Standing Position, Pick up Object from Floor Able to pick up shoe safely and easily   From Standing Position, Turn to Look Behind Over each Shoulder Looks behind from both sides and weight shifts well   Turn 360 Degrees Able to turn 360 degrees safely but slowly  R   Standing Unsupported, Alternately Place Feet on Step/Stool Able to stand independently and safely and complete 8 steps in 20 seconds  R= 4.84  L= 6.43   Standing Unsupported, One Foot in Front Able to plae foot ahead of the other independently and hold 30 seconds   Standing on One Leg Tries to lift leg/unable to hold 3 seconds but remains standing independently   Total Score 50   Timed Up and Go Test   TUG Normal TUG   Normal TUG (seconds) 14.79   High Level Balance   High Level Balance Activities Side stepping;Braiding;Marching forwards;Marching backwards  on red and blue mats             Balance Exercises - 07/10/14 1507    Balance Exercises: Standing   Rockerboard Anterior/posterior;30 seconds;Intermittent UE support   Retro Gait Foam/compliant surface;1 rep  CGA    Sidestepping Foam/compliant support;2 reps  min to CGA   Turning Both;3 reps  around cones on mat           PT Education - 07/10/14 1511    Education provided Yes   Education Details emphasized need to continue with single limb stance exercise at home   Person(s) Educated Patient   Methods Explanation;Demonstration   Comprehension Verbalized understanding;Returned demonstration          PT Short Term Goals - 06/22/14 1945    PT SHORT TERM GOAL #1   Title Incr. Berg score to >/= 44/56 to reduce fall risk.  (06-24-14)   Baseline 46/56 06-21-14   Time 4   Period Weeks   Status Achieved   PT SHORT TERM GOAL #2   Title Improve TUG score to </= 24 secs with cane for improved functional mobility.  (06-24-14)   Baseline 18.34 secs with cane;   17.87 secs without cane   Status Achieved   PT SHORT TERM GOAL #3   Title Incr. gait velocity to >/= 1.6 ft/sec with cane for incr. gait efficiency.   (06-24-14)   Baseline 16.89 secs   1.94   Status Achieved   PT SHORT TERM GOAL #4   Title Independent in HEP                (06-24-14)   Status Achieved           PT Long Term Goals - 07/10/14 1526    PT LONG TERM GOAL #1   Title Incr. Berg score to >/= 48/56 to decr. fall risk  (07-24-14)   Baseline 50/56 on 07-10-14   Status Achieved   PT LONG TERM GOAL #2   Title Incr. TUG score to </= 20 secs with cane with SBA for improved functional mobility   (07-24-14)   PT LONG TERM GOAL #3   Baseline 2.45 ft/sec with cane on 07-10-14   Status Achieved   PT LONG TERM GOAL #4   Title Modified independent household amb. without device per pt. report. (07-24-14)   Status Achieved   PT LONG TERM GOAL #5   Title Incr. FOTO by at least 10 points for incr. pt satisfaction  (07-24-14)   Baseline increased by 8 points   Status Partially Met               Plan - 07/10/14 1513    Clinical Impression Statement Pt has met all LTG's -  has progressed much quicker than initially anticipated; pt.  is pleased with her current functional status and is ready to  discharge from PT   Pt will benefit from skilled therapeutic intervention in order to improve on the following deficits Abnormal gait;Decreased range of motion;Impaired flexibility;Decreased balance;Pain;Decreased mobility;Decreased strength   Rehab Potential Good   PT Frequency 2x / week   PT Duration 8 weeks   PT Treatment/Interventions ADLs/Self Care Home Management;Therapeutic activities;Patient/family education;Therapeutic exercise;Gait training;Balance training;Passive range of motion;Stair training;Neuromuscular re-education;Functional mobility training   PT Next Visit Plan standing balance; ask about heel lift   PT Home Exercise Plan balance and LE stretching   Consulted and Agree with Plan of Care Patient          G-Codes - 25-Jul-2014 1521    Functional Assessment Tool Used Berg score 50/56; TUG score 14.79; gait velocity 2.45 ft/sec with pt carrying cane   Functional Limitation Mobility: Walking and moving around   Mobility: Walking and Moving Around Goal Status [phone]) At least 20 percent but less than 40 percent impaired, limited or restricted   Mobility: Walking and Moving Around Discharge Status [phone]) At least 1 percent but less than 20 percent impaired, limited or restricted      Problem List Patient Active Problem List   Diagnosis Date Noted  . Gait disorder 04/13/2014  . Eosinophilia 07/15/2013  . Iron deficiency anemia 08/10/2010  . B12 deficiency 08/10/2010    Alda Lea, PT 07/25/2014, 3:27 PM  [facility] [address] [place], [place], [address] Phone: [phone]   Fax:  [phone]

## 2014-07-11 ENCOUNTER — Ambulatory Visit: Payer: Medicare HMO | Admitting: Physical Therapy

## 2014-07-13 ENCOUNTER — Encounter (HOSPITAL_COMMUNITY): Payer: Medicare HMO | Attending: Hematology & Oncology

## 2014-07-13 DIAGNOSIS — D509 Iron deficiency anemia, unspecified: Secondary | ICD-10-CM

## 2014-07-13 LAB — CBC WITH DIFFERENTIAL/PLATELET
BASOS PCT: 1 % (ref 0–1)
Basophils Absolute: 0.1 10*3/uL (ref 0.0–0.1)
EOS ABS: 1.6 10*3/uL — AB (ref 0.0–0.7)
EOS PCT: 16 % — AB (ref 0–5)
HCT: 39.4 % (ref 36.0–46.0)
Hemoglobin: 13.1 g/dL (ref 12.0–15.0)
LYMPHS ABS: 1.4 10*3/uL (ref 0.7–4.0)
LYMPHS PCT: 14 % (ref 12–46)
MCH: 32.8 pg (ref 26.0–34.0)
MCHC: 33.2 g/dL (ref 30.0–36.0)
MCV: 98.5 fL (ref 78.0–100.0)
Monocytes Absolute: 0.7 10*3/uL (ref 0.1–1.0)
Monocytes Relative: 7 % (ref 3–12)
NEUTROS ABS: 6.2 10*3/uL (ref 1.7–7.7)
Neutrophils Relative %: 62 % (ref 43–77)
PLATELETS: 255 10*3/uL (ref 150–400)
RBC: 4 MIL/uL (ref 3.87–5.11)
RDW: 13.1 % (ref 11.5–15.5)
WBC: 9.9 10*3/uL (ref 4.0–10.5)

## 2014-07-13 LAB — IRON AND TIBC
IRON: 91 ug/dL (ref 28–170)
Saturation Ratios: 34 % — ABNORMAL HIGH (ref 10.4–31.8)
TIBC: 270 ug/dL (ref 250–450)
UIBC: 179 ug/dL

## 2014-07-13 LAB — FERRITIN: Ferritin: 75 ng/mL (ref 11–307)

## 2014-07-13 NOTE — Progress Notes (Signed)
Labs drawn

## 2014-07-18 ENCOUNTER — Ambulatory Visit: Payer: Medicare HMO | Admitting: Physical Therapy

## 2014-07-20 ENCOUNTER — Ambulatory Visit: Payer: Medicare HMO | Admitting: Physical Therapy

## 2014-09-07 ENCOUNTER — Ambulatory Visit: Payer: Medicare HMO | Admitting: Neurology

## 2014-10-13 ENCOUNTER — Encounter (HOSPITAL_COMMUNITY): Payer: Medicare HMO | Attending: Hematology & Oncology

## 2014-10-13 ENCOUNTER — Encounter (HOSPITAL_COMMUNITY): Payer: Medicare HMO | Attending: Hematology & Oncology | Admitting: Hematology & Oncology

## 2014-10-13 ENCOUNTER — Encounter (HOSPITAL_COMMUNITY): Payer: Self-pay | Admitting: Hematology & Oncology

## 2014-10-13 VITALS — Wt 104.6 lb

## 2014-10-13 DIAGNOSIS — D509 Iron deficiency anemia, unspecified: Secondary | ICD-10-CM

## 2014-10-13 DIAGNOSIS — E538 Deficiency of other specified B group vitamins: Secondary | ICD-10-CM

## 2014-10-13 LAB — CBC WITH DIFFERENTIAL/PLATELET
BASOS ABS: 0.1 10*3/uL (ref 0.0–0.1)
Basophils Relative: 1 % (ref 0–1)
Eosinophils Absolute: 1.4 10*3/uL — ABNORMAL HIGH (ref 0.0–0.7)
Eosinophils Relative: 18 % — ABNORMAL HIGH (ref 0–5)
HCT: 40.9 % (ref 36.0–46.0)
HEMOGLOBIN: 13.8 g/dL (ref 12.0–15.0)
LYMPHS ABS: 1.5 10*3/uL (ref 0.7–4.0)
LYMPHS PCT: 18 % (ref 12–46)
MCH: 33.3 pg (ref 26.0–34.0)
MCHC: 33.7 g/dL (ref 30.0–36.0)
MCV: 98.6 fL (ref 78.0–100.0)
Monocytes Absolute: 0.5 10*3/uL (ref 0.1–1.0)
Monocytes Relative: 6 % (ref 3–12)
Neutro Abs: 4.8 10*3/uL (ref 1.7–7.7)
Neutrophils Relative %: 59 % (ref 43–77)
Platelets: 279 10*3/uL (ref 150–400)
RBC: 4.15 MIL/uL (ref 3.87–5.11)
RDW: 12.5 % (ref 11.5–15.5)
WBC: 8.3 10*3/uL (ref 4.0–10.5)

## 2014-10-13 LAB — IRON AND TIBC
IRON: 88 ug/dL (ref 28–170)
SATURATION RATIOS: 27 % (ref 10.4–31.8)
TIBC: 321 ug/dL (ref 250–450)
UIBC: 233 ug/dL

## 2014-10-13 LAB — FERRITIN: FERRITIN: 31 ng/mL (ref 11–307)

## 2014-10-13 NOTE — Progress Notes (Signed)
LABS DRAWN

## 2014-10-13 NOTE — Patient Instructions (Signed)
[place] [facility] Discharge Instructions  RECOMMENDATIONS MADE BY THE CONSULTANT AND ANY TEST RESULTS WILL BE SENT TO YOUR REFERRING PHYSICIAN.  Lab work in 6 months. MD appointment in 6 months. Report any issues/concerns to clinic as needed prior to appointment.  Thank you for choosing [place] [facility] to provide your oncology and hematology care.  To afford each patient quality time with our provider, please arrive at least 15 minutes before your scheduled appointment time.    You need to re-schedule your appointment should you arrive 10 or more minutes late.  We strive to give you quality time with our providers, and arriving late affects you and other patients whose appointments are after yours.  Also, if you no show three or more times for appointments you may be dismissed from the clinic at the providers discretion.     Again, thank you for choosing [facility].  Our hope is that these requests will decrease the amount of time that you wait before being seen by our physicians.       _____________________________________________________________  Should you have questions after your visit to [facility], please contact our office at [phone] between the hours of 8:30 a.m. and 4:30 p.m.  Voicemails left after 4:30 p.m. will not be returned until the following business day.  For prescription refill requests, have your pharmacy contact our office.

## 2014-10-13 NOTE — Progress Notes (Signed)
[name] [name], [address], [address] [place] [address]  Iron Deficiency Probable GI related blood loss  CURRENT THERAPY: Monitor labs including iron studies  INTERVAL HISTORY: [name] [name] 81 y.o. female returns for followup of Iron deficiency anemia secondary to GI blood loss requiring periodic IV iron, last administered on 05/04/2014.  Hematologically, she denies any complaints and ROS questioning is negative.   [name] [name] is here today with her daughter. She is incredibly pleasant, often smiling and laughing. She states that she is active and loves to work in the yard. She is a Consulting civil engineer gardener.  Her blood pressure is high today. She states that it seems to be that way only when she comes here. She hasn't had a mammogram in the last two years. She is no longer interested in getting them. Her primary care physician is Dr. Mancel Bale.  She has fallen twice recently and had imaging of her head. The second time she fell, she had to have some stitches, but since then, she's been all right. She states that she is unsure as to why she's been falling. The second time she fell, she was on the way back to bed from the bathroom.   She has had physical therapy to work on her gait and strength.  She denies noticing any blood in her stool recently, or any black, tarry stools. When palpated, she denies abdominal pain.  She shows swelling in her ankles which she confirms is better in the morning, but gets worse as the day goes on.   Past Medical History  Diagnosis Date  . Thyroid disease     hypothyroid  . Cataract     bilateral  . Anemia     iron def  . Iron deficiency anemia 08/10/2010  . B12 deficiency 08/10/2010  . Hypertension   . Gait disorder 04/13/2014    has Iron deficiency anemia; B12 deficiency; Eosinophilia; and Gait disorder on her problem list.     is allergic to fergon; ampicillin; cephalexin; codeine; famotidine; nizatidine;  tagamet; and salmon.  [name] [name] does not currently have medications on file.  Past Surgical History  Procedure Laterality Date  . Vagotomy  1971  . Abdominal hysterectomy  1975  . Appendectomy  1955  . Cholecystectomy  1978  . Esophageal dilation  2001  . Esophageal dilation  2004  . Vagotomy     SOCIAL HISTORY: She loves to work in the yard. She is a Administrator, Civil Service and a vegetable gardener. She as two children and two grandchildren. Husband is still around and is aged 67-84  ROS  Denies any headaches, dizziness, double vision, fevers, chills, night sweats, nausea, vomiting, diarrhea, constipation, chest pain, heart palpitations, shortness of breath, blood in stool, black tarry stool, urinary pain, urinary burning, urinary frequency, hematuria.  Musculoskeletal: Positive for swelling in her ankles. 14 point review of systems was performed and is negative except as detailed under history of present illness and above     PHYSICAL EXAMINATION  ECOG PERFORMANCE STATUS: 0 - Asymptomatic  There were no vitals filed for this visit.  GENERAL:alert, no distress, well nourished, well developed, comfortable, cooperative and smiling SKIN: skin color, texture, turgor are normal, no rashes or significant lesions HEAD: Normocephalic, No masses, lesions, tenderness or abnormalities EYES: normal, PERRLA, EOMI, Conjunctiva are pink and non-injected EARS: External ears normal OROPHARYNX:lips, buccal mucosa, and tongue normal and mucous membranes are moist  NECK: supple, no adenopathy,  thyroid normal size, non-tender, without nodularity, no stridor, non-tender, trachea midline LYMPH:  no palpable lymphadenopathy, no hepatosplenomegaly BREAST:not examined LUNGS: clear to auscultation and percussion HEART: regular rate & rhythm, no murmurs, no gallops, S1 normal and S2 normal ABDOMEN:abdomen soft, non-tender, normal bowel sounds and no masses or organomegaly BACK: Back symmetric, no  curvature. EXTREMITIES:less then 2 second capillary refill, no joint deformities, effusion, or inflammation, no edema, no skin discoloration, no clubbing, no cyanosis  NEURO: alert & oriented x 3 with fluent speech, no focal motor/sensory deficits, gait normal   LABORATORY DATA: CBC    Component Value Date/Time   WBC 8.3 10/13/2014 1100   RBC 4.15 10/13/2014 1100   HGB 13.8 10/13/2014 1100   HCT 40.9 10/13/2014 1100   PLT 279 10/13/2014 1100   MCV 98.6 10/13/2014 1100   MCH 33.3 10/13/2014 1100   MCHC 33.7 10/13/2014 1100   RDW 12.5 10/13/2014 1100   LYMPHSABS 1.5 10/13/2014 1100   MONOABS 0.5 10/13/2014 1100   EOSABS 1.4* 10/13/2014 1100   BASOSABS 0.1 10/13/2014 1100      Chemistry      Component Value Date/Time   NA 136 05/24/2014 1715   K 3.9 05/24/2014 1715   CL 102 05/24/2014 1715   CO2 23 05/24/2014 1715   BUN 11 05/24/2014 1715   CREATININE 0.77 05/24/2014 1715      Component Value Date/Time   CALCIUM 8.7 05/24/2014 1715   ALKPHOS 99 10/11/2011 1900   AST 19 10/11/2011 1900   ALT 12 10/11/2011 1900   BILITOT 0.3 10/11/2011 1900     Lab Results  Component Value Date   IRON 91 07/13/2014   TIBC 270 07/13/2014   FERRITIN 75 07/13/2014    ASSESSMENT AND PLAN:  Iron deficiency  IV iron replacement Intolerance to oral iron  We will continue to monitor counts and support her iron deficiency with IV iron as needed.  Follow-up about her labs which were drawn today.She will be notified next week. We will follow-up with her further after the holidays, according to schedule.  All questions were answered. The patient knows to call the clinic with any problems, questions or concerns. We can certainly see the patient much sooner if necessary.  This document serves as a record of services personally performed by Ancil Linsey, MD. It was created on her behalf by Toni Amend, a trained medical scribe. The creation of this record is based on the scribe's  personal observations and the provider's statements to them. This document has been checked and approved by the attending provider.  I have reviewed the above documentation for accuracy and completeness, and I agree with the above.  This note is electronically signed by: Molli Hazard 10/13/2014 3:56 PM

## 2014-10-16 ENCOUNTER — Other Ambulatory Visit (HOSPITAL_COMMUNITY): Payer: Self-pay | Admitting: Oncology

## 2014-10-16 DIAGNOSIS — D509 Iron deficiency anemia, unspecified: Secondary | ICD-10-CM

## 2014-10-24 ENCOUNTER — Encounter (HOSPITAL_COMMUNITY): Payer: Self-pay

## 2014-11-03 ENCOUNTER — Encounter (HOSPITAL_COMMUNITY): Payer: Self-pay

## 2014-11-03 ENCOUNTER — Encounter (HOSPITAL_BASED_OUTPATIENT_CLINIC_OR_DEPARTMENT_OTHER): Payer: Medicare HMO

## 2014-11-03 VITALS — BP 169/82 | HR 70 | Temp 97.8°F | Resp 18

## 2014-11-03 DIAGNOSIS — D509 Iron deficiency anemia, unspecified: Secondary | ICD-10-CM

## 2014-11-03 MED ORDER — SODIUM CHLORIDE 0.9 % IV SOLN
510.0000 mg | Freq: Once | INTRAVENOUS | Status: AC
  Administered 2014-11-03: 510 mg via INTRAVENOUS
  Filled 2014-11-03: qty 17

## 2014-11-03 MED ORDER — SODIUM CHLORIDE 0.9 % IV SOLN
Freq: Once | INTRAVENOUS | Status: AC
  Administered 2014-11-03: 14:00:00 via INTRAVENOUS

## 2014-11-03 MED ORDER — SODIUM CHLORIDE 0.9 % IJ SOLN: 10.0000 mL | Freq: Once | INTRAMUSCULAR | Status: DC

## 2014-11-03 MED ORDER — NA FERRIC GLUC CPLX IN SUCROSE 12.5 MG/ML IV SOLN
125.0000 mg | Freq: Once | INTRAVENOUS | Status: DC
  Filled 2014-11-03: qty 10

## 2014-11-03 NOTE — Progress Notes (Signed)
Tolerated well

## 2014-11-06 ENCOUNTER — Other Ambulatory Visit (HOSPITAL_COMMUNITY): Payer: Self-pay

## 2014-11-13 ENCOUNTER — Telehealth: Payer: Self-pay | Admitting: Hematology

## 2014-11-13 NOTE — Telephone Encounter (Signed)
new patient appt-s/w patient and gave np appt for 04/16/15 @ 11 w/Dr. Irene Limbo Referring Ancil Linsey Dx- iron deficiency anemia

## 2015-03-21 ENCOUNTER — Telehealth: Payer: Self-pay | Admitting: *Deleted

## 2015-03-21 ENCOUNTER — Encounter: Payer: Self-pay | Admitting: Hematology

## 2015-03-21 ENCOUNTER — Ambulatory Visit (HOSPITAL_BASED_OUTPATIENT_CLINIC_OR_DEPARTMENT_OTHER): Payer: Medicare HMO

## 2015-03-21 ENCOUNTER — Ambulatory Visit (HOSPITAL_BASED_OUTPATIENT_CLINIC_OR_DEPARTMENT_OTHER): Payer: Medicare HMO | Admitting: Hematology

## 2015-03-21 VITALS — BP 148/75 | HR 82 | Temp 97.8°F | Resp 18 | Ht 62.0 in | Wt 105.3 lb

## 2015-03-21 DIAGNOSIS — D509 Iron deficiency anemia, unspecified: Secondary | ICD-10-CM

## 2015-03-21 DIAGNOSIS — E538 Deficiency of other specified B group vitamins: Secondary | ICD-10-CM | POA: Diagnosis not present

## 2015-03-21 DIAGNOSIS — D500 Iron deficiency anemia secondary to blood loss (chronic): Secondary | ICD-10-CM | POA: Diagnosis not present

## 2015-03-21 DIAGNOSIS — Z903 Acquired absence of stomach [part of]: Secondary | ICD-10-CM | POA: Diagnosis not present

## 2015-03-21 LAB — CBC & DIFF AND RETIC
BASO%: 0.7 % (ref 0.0–2.0)
BASOS ABS: 0.1 10*3/uL (ref 0.0–0.1)
EOS ABS: 1.8 10*3/uL — AB (ref 0.0–0.5)
EOS%: 20.7 % — ABNORMAL HIGH (ref 0.0–7.0)
HCT: 39.3 % (ref 34.8–46.6)
HEMOGLOBIN: 13.3 g/dL (ref 11.6–15.9)
IMMATURE RETIC FRACT: 3.3 % (ref 1.60–10.00)
LYMPH%: 18.5 % (ref 14.0–49.7)
MCH: 33.2 pg (ref 25.1–34.0)
MCHC: 33.8 g/dL (ref 31.5–36.0)
MCV: 98 fL (ref 79.5–101.0)
MONO#: 0.6 10*3/uL (ref 0.1–0.9)
MONO%: 7.2 % (ref 0.0–14.0)
NEUT%: 52.9 % (ref 38.4–76.8)
NEUTROS ABS: 4.5 10*3/uL (ref 1.5–6.5)
Platelets: 311 10*3/uL (ref 145–400)
RBC: 4.01 10*6/uL (ref 3.70–5.45)
RDW: 12.1 % (ref 11.2–14.5)
RETIC %: 1.36 % (ref 0.70–2.10)
Retic Ct Abs: 54.54 10*3/uL (ref 33.70–90.70)
WBC: 8.6 10*3/uL (ref 3.9–10.3)
lymph#: 1.6 10*3/uL (ref 0.9–3.3)

## 2015-03-21 LAB — COMPREHENSIVE METABOLIC PANEL
ALBUMIN: 2.9 g/dL — AB (ref 3.5–5.0)
ALK PHOS: 61 U/L (ref 40–150)
ALT: 11 U/L (ref 0–55)
ANION GAP: 7 meq/L (ref 3–11)
AST: 15 U/L (ref 5–34)
BUN: 17.5 mg/dL (ref 7.0–26.0)
CALCIUM: 8.7 mg/dL (ref 8.4–10.4)
CHLORIDE: 106 meq/L (ref 98–109)
CO2: 28 mEq/L (ref 22–29)
Creatinine: 0.8 mg/dL (ref 0.6–1.1)
EGFR: 70 mL/min/{1.73_m2} — ABNORMAL LOW (ref >90)
Glucose: 106 mg/dl (ref 70–140)
POTASSIUM: 4.1 meq/L (ref 3.5–5.1)
Sodium: 140 mEq/L (ref 136–145)
Total Bilirubin: 0.33 mg/dL (ref 0.20–1.20)
Total Protein: 6 g/dL — ABNORMAL LOW (ref 6.4–8.3)

## 2015-03-21 LAB — FERRITIN: FERRITIN: 57 ng/mL (ref 9–269)

## 2015-03-21 NOTE — Progress Notes (Signed)
[name]    HEMATOLOGY/ONCOLOGY CONSULTATION NOTE  Date of Service: 03/21/2015  Patient Care Team: [name], MD as PCP - General (Internal Medicine)  CHIEF COMPLAINTS/PURPOSE OF CONSULTATION:  Transferring cares from [facility] hospital to [facility] for management of Iron deficiency anemia.  Diagnosis: 1) Iron deficiency anemia due to chronic GI blood loss requiring intermitent IV iron infusions. Last received this about  2) B12 deficiency on po B complex replacement.   INTERVAL HISTORY  [name] [name] is a wonderful 82 y.o. female who  Is transferring her hematologic cares from [facility] for geographic convinience reasons. She is accompanied by her husband to this visit. She was last seen for hematology by Dr Ancil Linsey on 10/13/2014. She notes she has been having her labs checked every 3 months and has been receiving IV iron to maintain her ferritin >100. This has been an issues every since she has a bleeding GU and required a partial gastrectomy and 11 units of PRBC at long time ago. She notes that she has been feeling an little fatigued. No overt melena, hematochezia or other overt blood loss. Notes that she has tolerated IV feraheme in the past without any issues but did have an allergic reaction to [facility]. Has been taking B complex daily. No there acute new symptoms.  MEDICAL HISTORY:  Past Medical History  Diagnosis Date  . Thyroid disease     hypothyroid  . Cataract     bilateral  . Anemia     iron def  . Iron deficiency anemia 08/10/2010  . B12 deficiency 08/10/2010  . Hypertension   . Gait disorder 04/13/2014  Partial gastrectomy (for bleeding stomach ulcer 11 units)  SURGICAL HISTORY: Past Surgical History  Procedure Laterality Date  . Vagotomy  1971  . Abdominal hysterectomy  1975  . Appendectomy  1955  . Cholecystectomy  1978  . Esophageal dilation  2001  . Esophageal dilation  2004  . Vagotomy      SOCIAL HISTORY: Social History    Social History  . Marital Status: Married    Spouse Name: Mauna Glazewski  . Number of Children: 2  . Years of Education: 12   Occupational History  . retired    Social History Main Topics  . Smoking status: Never Smoker   . Smokeless tobacco: Never Used  . Alcohol Use: 4.2 oz/week    7 Glasses of wine per week     Comment: occ  . Drug Use: No  . Sexual Activity: Not on file   Other Topics Concern  . Not on file   Social History Narrative   Patient is right handed.   Patient drinks 1 cup of caffeine per day.    FAMILY HISTORY: Family History  Problem Relation Age of Onset  . Cancer Sister   . Hypertension Brother   . Heart disease Father     ALLERGIES:  is allergic to fergon; ampicillin; cephalexin; codeine; famotidine; nizatidine; tagamet; and salmon.  MEDICATIONS:  Current Outpatient Prescriptions  Medication Sig Dispense Refill  . acetaminophen (TYLENOL) 325 MG tablet Take 650 mg by mouth every 6 (six) hours as needed. For aches/pains    . Ascorbic Acid (VITAMIN C) 500 MG CAPS Take 500 mg by mouth 4 (four) times daily.      [name] b complex vitamins capsule Take 1 capsule by mouth daily.    . Calcium Carbonate-Vitamin D (CALCIUM 500 + D PO) Take by mouth. 3 tablets daily    .  diphenhydrAMINE (BENADRYL) 25 mg capsule Take 25 mg by mouth at bedtime as needed. For allergies    . estradiol (ESTRACE) 1 MG tablet Take 1 mg by mouth daily.    [name] levothyroxine (SYNTHROID, LEVOTHROID) 112 MCG tablet Take 112 mcg by mouth daily before breakfast.    . loperamide (IMODIUM) 2 MG capsule Take 4 mg by mouth 4 (four) times daily as needed for diarrhea or loose stools.     [name] losartan (COZAAR) 25 MG tablet Take 25 mg by mouth daily. Takes 1/2 tablet daily     . omeprazole (PRILOSEC) 20 MG capsule Take 20 mg by mouth 2 (two) times daily.      No current facility-administered medications for this visit.    REVIEW OF SYSTEMS:    10 Point review of Systems was done is negative except  as noted above.  PHYSICAL EXAMINATION: ECOG PERFORMANCE STATUS: 2 - Symptomatic, <50% confined to bed  . Filed Vitals:   03/21/15 1307  BP: 148/75  Pulse: 82  Temp: 97.8 F (36.6 C)  Resp: 18   Filed Weights   03/21/15 1307  Weight: 105 lb 4.8 oz (47.764 kg)   .Body mass index is 19.25 kg/(m^2).  GENERAL:alert, in no acute distress and comfortable SKIN: skin color, texture, turgor are normal, no rashes or significant lesions EYES: normal, conjunctiva are pink and non-injected, sclera clear OROPHARYNX:no exudate, no erythema and lips, buccal mucosa, and tongue normal  NECK: supple, no JVD, thyroid normal size, non-tender, without nodularity LYMPH:  no palpable lymphadenopathy in the cervical, axillary or inguinal LUNGS: clear to auscultation with normal respiratory effort HEART: regular rate & rhythm,  no murmurs and no lower extremity edema ABDOMEN: abdomen soft, non-tender, normoactive bowel sounds  Musculoskeletal: no cyanosis of digits and no clubbing  PSYCH: alert & oriented x 3 with fluent speech NEURO: no focal motor/sensory deficits  LABORATORY DATA:  I have reviewed the data as listed  . CBC Latest Ref Rng 03/21/2015 10/13/2014 07/13/2014  WBC 3.9 - 10.3 10e3/uL 8.6 8.3 9.9  Hemoglobin 11.6 - 15.9 g/dL 13.3 13.8 13.1  Hematocrit 34.8 - 46.6 % 39.3 40.9 39.4  Platelets 145 - 400 10e3/uL 311 279 255    . CMP Latest Ref Rng 03/21/2015 05/24/2014 10/11/2011  Glucose 70 - 140 mg/dl 106 100(H) 105(H)  BUN 7.0 - 26.0 mg/dL 17.5 11 12   Creatinine 0.6 - 1.1 mg/dL 0.8 0.77 0.61  Sodium 136 - 145 mEq/L 140 136 130(L)  Potassium 3.5 - 5.1 mEq/L 4.1 3.9 3.2(L)  Chloride 96 - 112 mmol/L - 102 94(L)  CO2 22 - 29 mEq/L 28 23 23   Calcium 8.4 - 10.4 mg/dL 8.7 8.7 8.8  Total Protein 6.4 - 8.3 g/dL 6.0(L) - 6.4  Total Bilirubin 0.20 - 1.20 mg/dL 0.33 - 0.3  Alkaline Phos 40 - 150 U/L 61 - 99  AST 5 - 34 U/L 15 - 19  ALT 0 - 55 U/L 11 - 12   . Lab Results  Component Value Date    IRON 88 10/13/2014   TIBC 321 10/13/2014   IRONPCTSAT 27 10/13/2014   (Iron and TIBC)  Lab Results  Component Value Date   FERRITIN 57 03/21/2015      RADIOGRAPHIC STUDIES: I have personally reviewed the radiological images as listed and agreed with the findings in the report. No results found.  ASSESSMENT & PLAN:   82 yo caucasian female with   1) CHronic iron deficiency anemia due to chronic GI  blood loss and likely issues with absorption related to partial gastrectomy. Also patient has had intolerance to po iron 2) h/o B12 deficiency - level stable currently with B complex replacement. Plan -will continue to monitor cbc, cmp and iron labs q 8months and replace iron IV to maintain ferritin >100 -have ordered IV feraheme qweekly x 2 doses. Patient takes her own antihistamines pre-visit for allergic risk mitigation. -continue B complex - if declining B12 levels would need to add SL b12 replacement in addition as well.  RTC with Dr Irene Limbo in 6 months with cbc, cmp and ferritin. Earlier if any new concerns/questions.  All of the patients questions were answered with apparent satisfaction. The patient knows to call the clinic with any problems, questions or concerns.  I spent 25 minutes counseling the patient face to face. The total time spent in the appointment was 30 minutes and more than 50% was on counseling and direct patient cares.    Sullivan Lone MD [place] AAHIVMS [facility] Hematology/Oncology Physician [facility]  (Office):       [phone] (Work cell):  [phone] (Fax):           [phone]  03/21/2015 1:14 PM

## 2015-03-21 NOTE — Telephone Encounter (Signed)
Per staff message and POF I have scheduled appts. Advised scheduler of appts. JMW  

## 2015-03-21 NOTE — Telephone Encounter (Signed)
Per staff phone call and POF I have schedueld appts. Scheduler advised of appts.  JMW  

## 2015-03-22 LAB — VITAMIN B12: Vitamin B12: 662 pg/mL (ref 211–946)

## 2015-03-28 ENCOUNTER — Other Ambulatory Visit: Payer: Self-pay | Admitting: Hematology

## 2015-03-28 ENCOUNTER — Ambulatory Visit (HOSPITAL_BASED_OUTPATIENT_CLINIC_OR_DEPARTMENT_OTHER): Payer: Medicare HMO

## 2015-03-28 ENCOUNTER — Other Ambulatory Visit: Payer: Self-pay | Admitting: *Deleted

## 2015-03-28 VITALS — BP 172/74 | HR 72 | Temp 98.0°F | Resp 17 | Ht 62.0 in

## 2015-03-28 DIAGNOSIS — D509 Iron deficiency anemia, unspecified: Secondary | ICD-10-CM

## 2015-03-28 MED ORDER — SODIUM CHLORIDE 0.9 % IV SOLN
Freq: Once | INTRAVENOUS | Status: AC
  Administered 2015-03-28: 12:00:00 via INTRAVENOUS

## 2015-03-28 MED ORDER — SODIUM CHLORIDE 0.9 % IV SOLN
510.0000 mg | Freq: Once | INTRAVENOUS | Status: AC
  Administered 2015-03-28: 510 mg via INTRAVENOUS
  Filled 2015-03-28: qty 17

## 2015-03-28 NOTE — Patient Instructions (Signed)

## 2015-04-04 ENCOUNTER — Ambulatory Visit (HOSPITAL_BASED_OUTPATIENT_CLINIC_OR_DEPARTMENT_OTHER): Payer: Medicare HMO

## 2015-04-04 VITALS — BP 163/65 | HR 79 | Temp 98.2°F | Resp 18

## 2015-04-04 DIAGNOSIS — D509 Iron deficiency anemia, unspecified: Secondary | ICD-10-CM | POA: Diagnosis not present

## 2015-04-04 MED ORDER — SODIUM CHLORIDE 0.9 % IV SOLN
510.0000 mg | Freq: Once | INTRAVENOUS | Status: AC
  Administered 2015-04-04: 510 mg via INTRAVENOUS
  Filled 2015-04-04: qty 17

## 2015-04-04 MED ORDER — SODIUM CHLORIDE 0.9 % IV SOLN
Freq: Once | INTRAVENOUS | Status: AC
  Administered 2015-04-04: 10:00:00 via INTRAVENOUS

## 2015-04-04 NOTE — Patient Instructions (Signed)

## 2015-04-12 ENCOUNTER — Other Ambulatory Visit (HOSPITAL_COMMUNITY): Payer: Medicare HMO

## 2015-04-12 ENCOUNTER — Ambulatory Visit (HOSPITAL_COMMUNITY): Payer: Medicare HMO | Admitting: Hematology & Oncology

## 2015-04-16 ENCOUNTER — Ambulatory Visit: Payer: Medicare HMO | Admitting: Hematology

## 2015-04-30 IMAGING — DX DG SHOULDER 2+V*R*
3 series · 3 of 3 positions shown · non-contrast
Comparison: None.

CLINICAL DATA: Right shoulder pain following fall 2 days ago,
initial encounter

EXAM:
RIGHT SHOULDER - 2+ VIEW

[shoulder grashey]
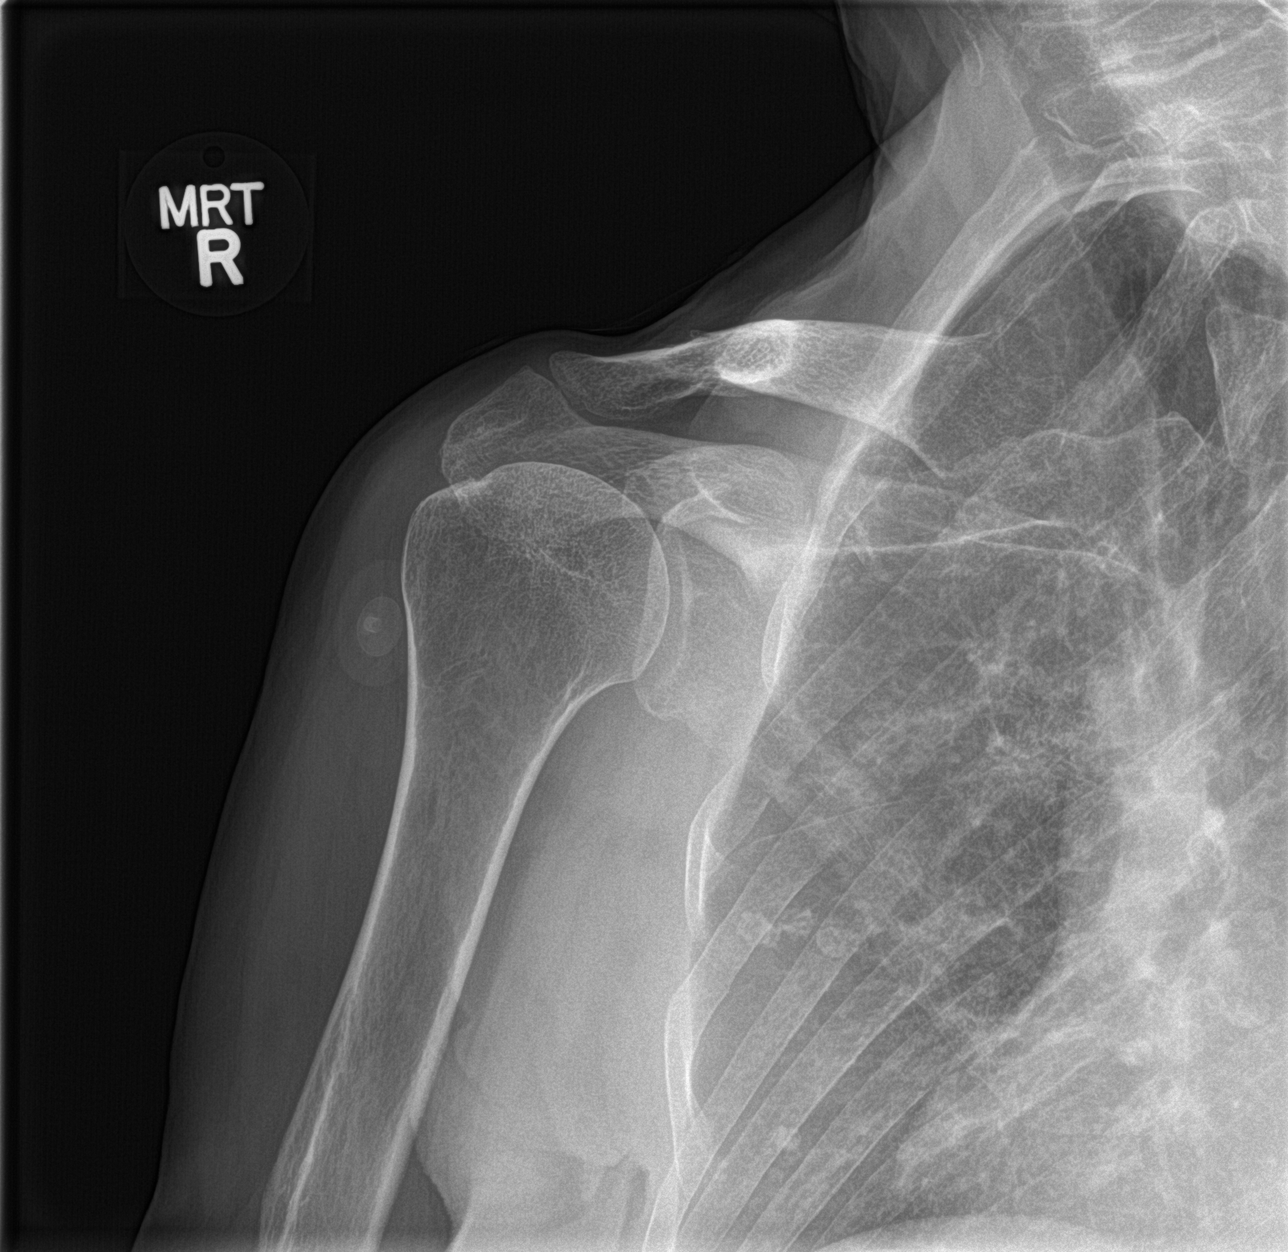

[shoulder y view]
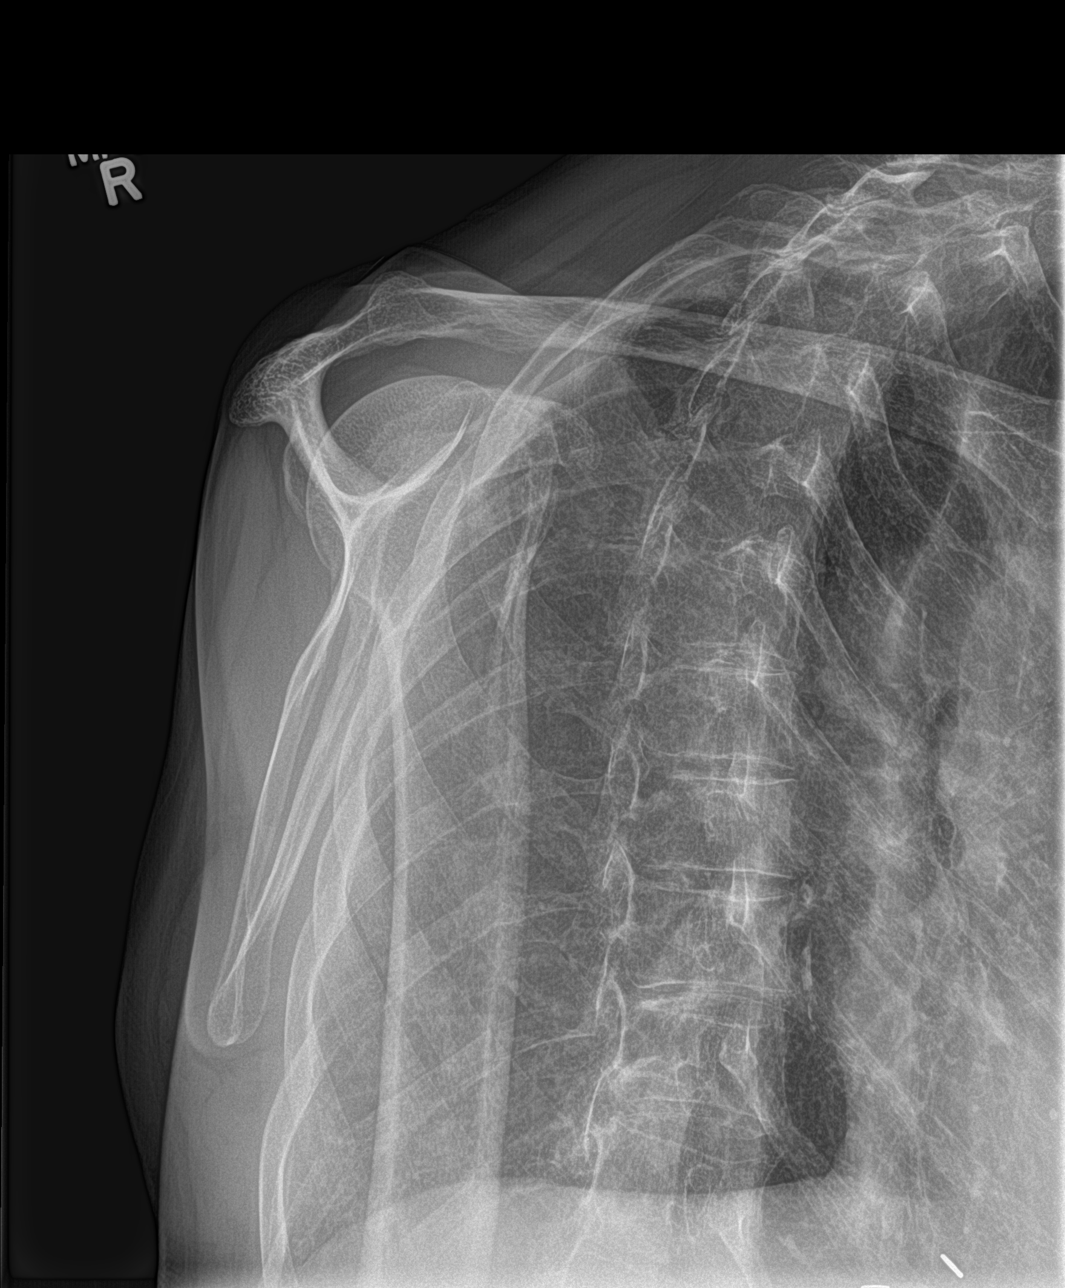

[shoulder axillary]
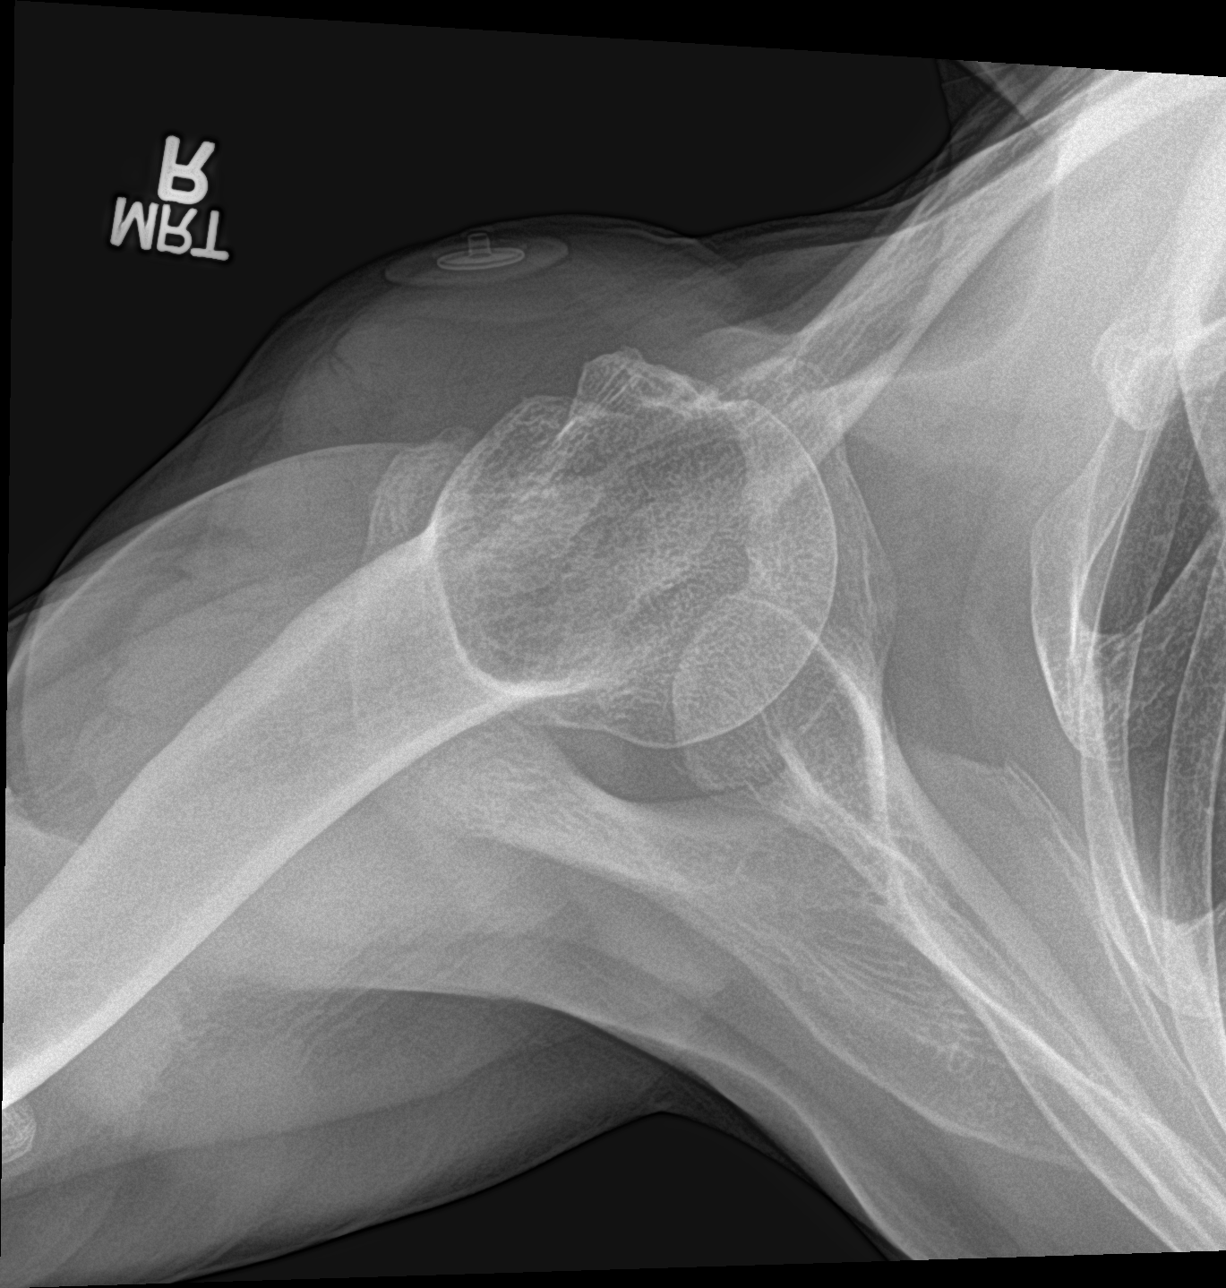

[3 of 3 positions shown; findings below may reference images not displayed]

FINDINGS: There is no evidence of fracture or dislocation. There is no
evidence of arthropathy or other focal bone abnormality. Soft
tissues are unremarkable.
IMPRESSION: No acute abnormality noted.

## 2015-04-30 IMAGING — DX DG CHEST 2V
2 series · 2 of 2 positions shown · non-contrast
Comparison: 04/02/2009

CLINICAL DATA: Syncopal episode yesterday with fall, initial
encounter

EXAM:
CHEST  2 VIEW

[chest pa]
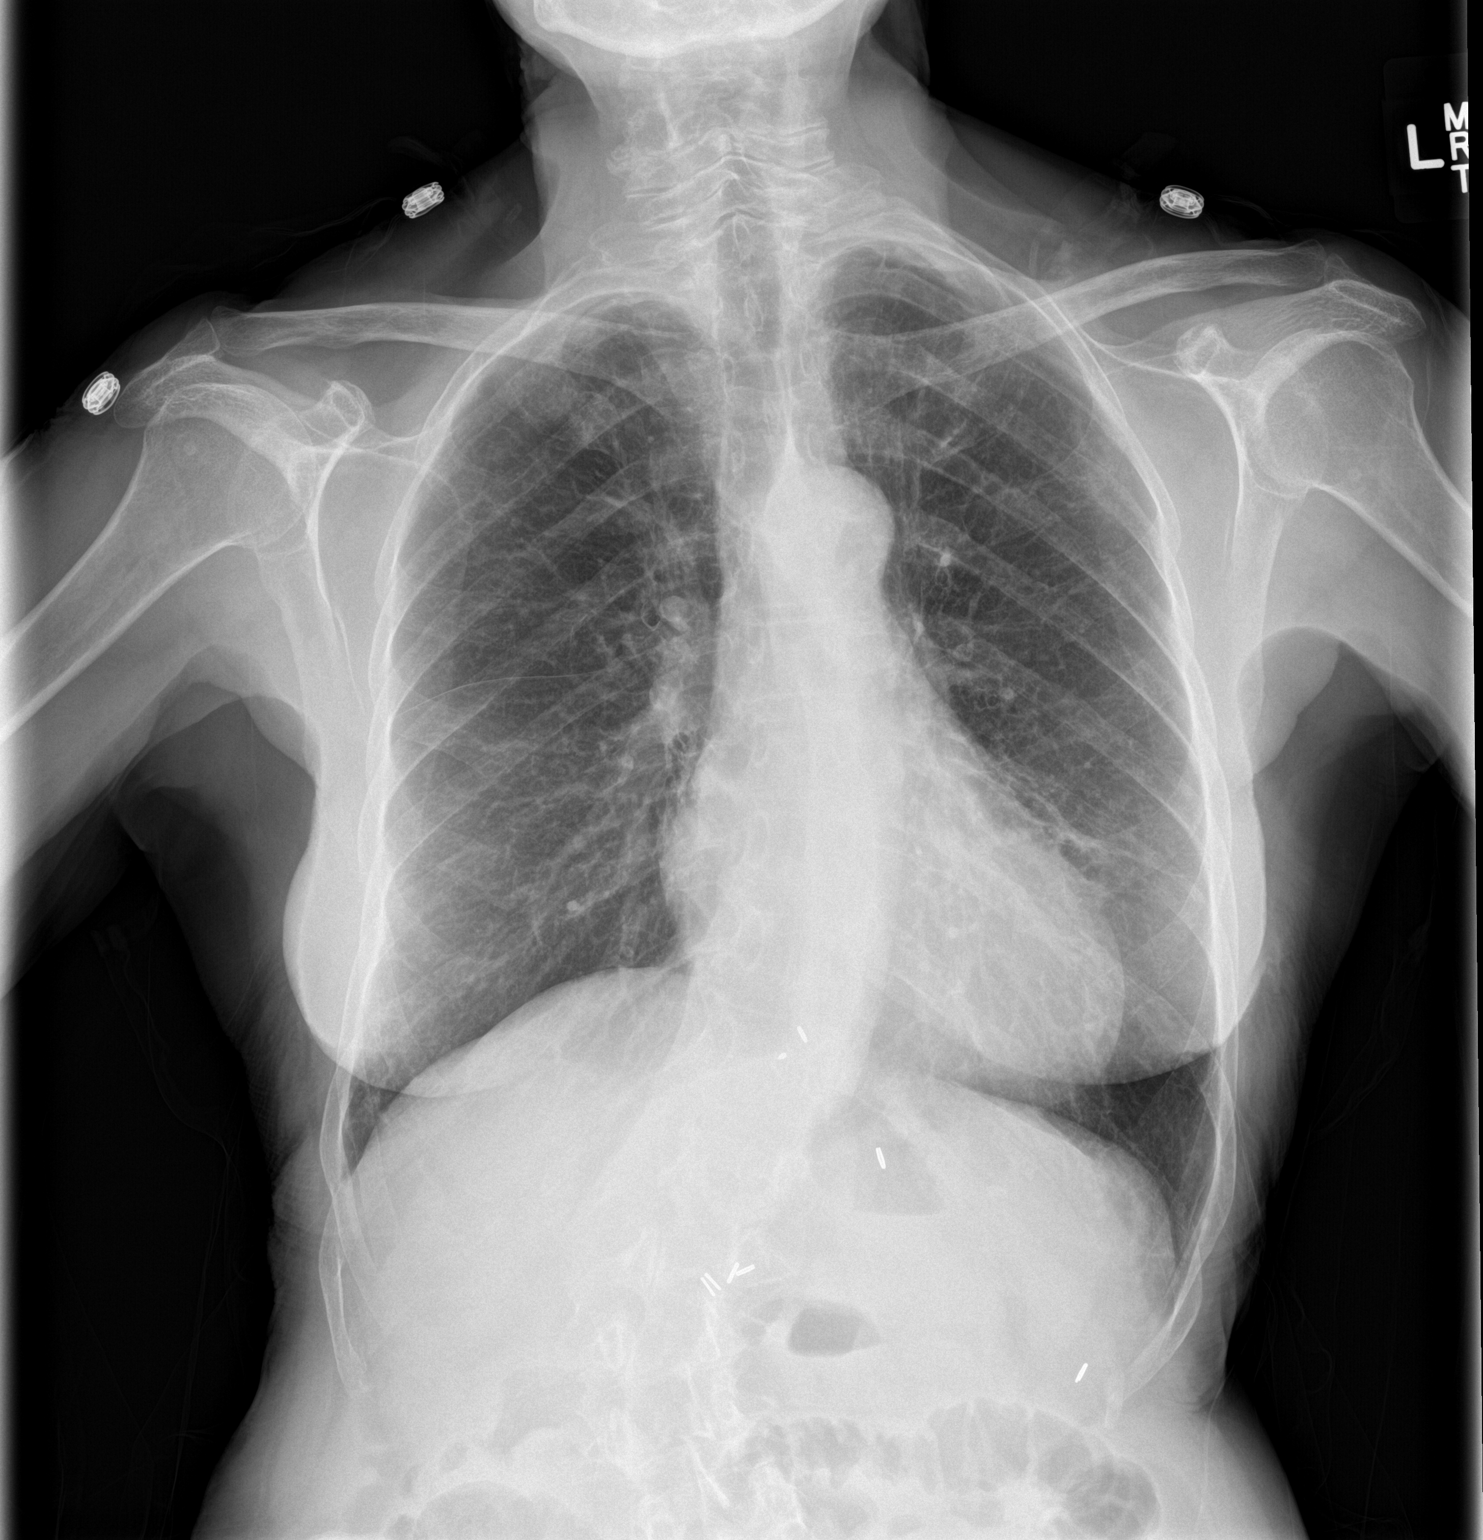

[chest lat]
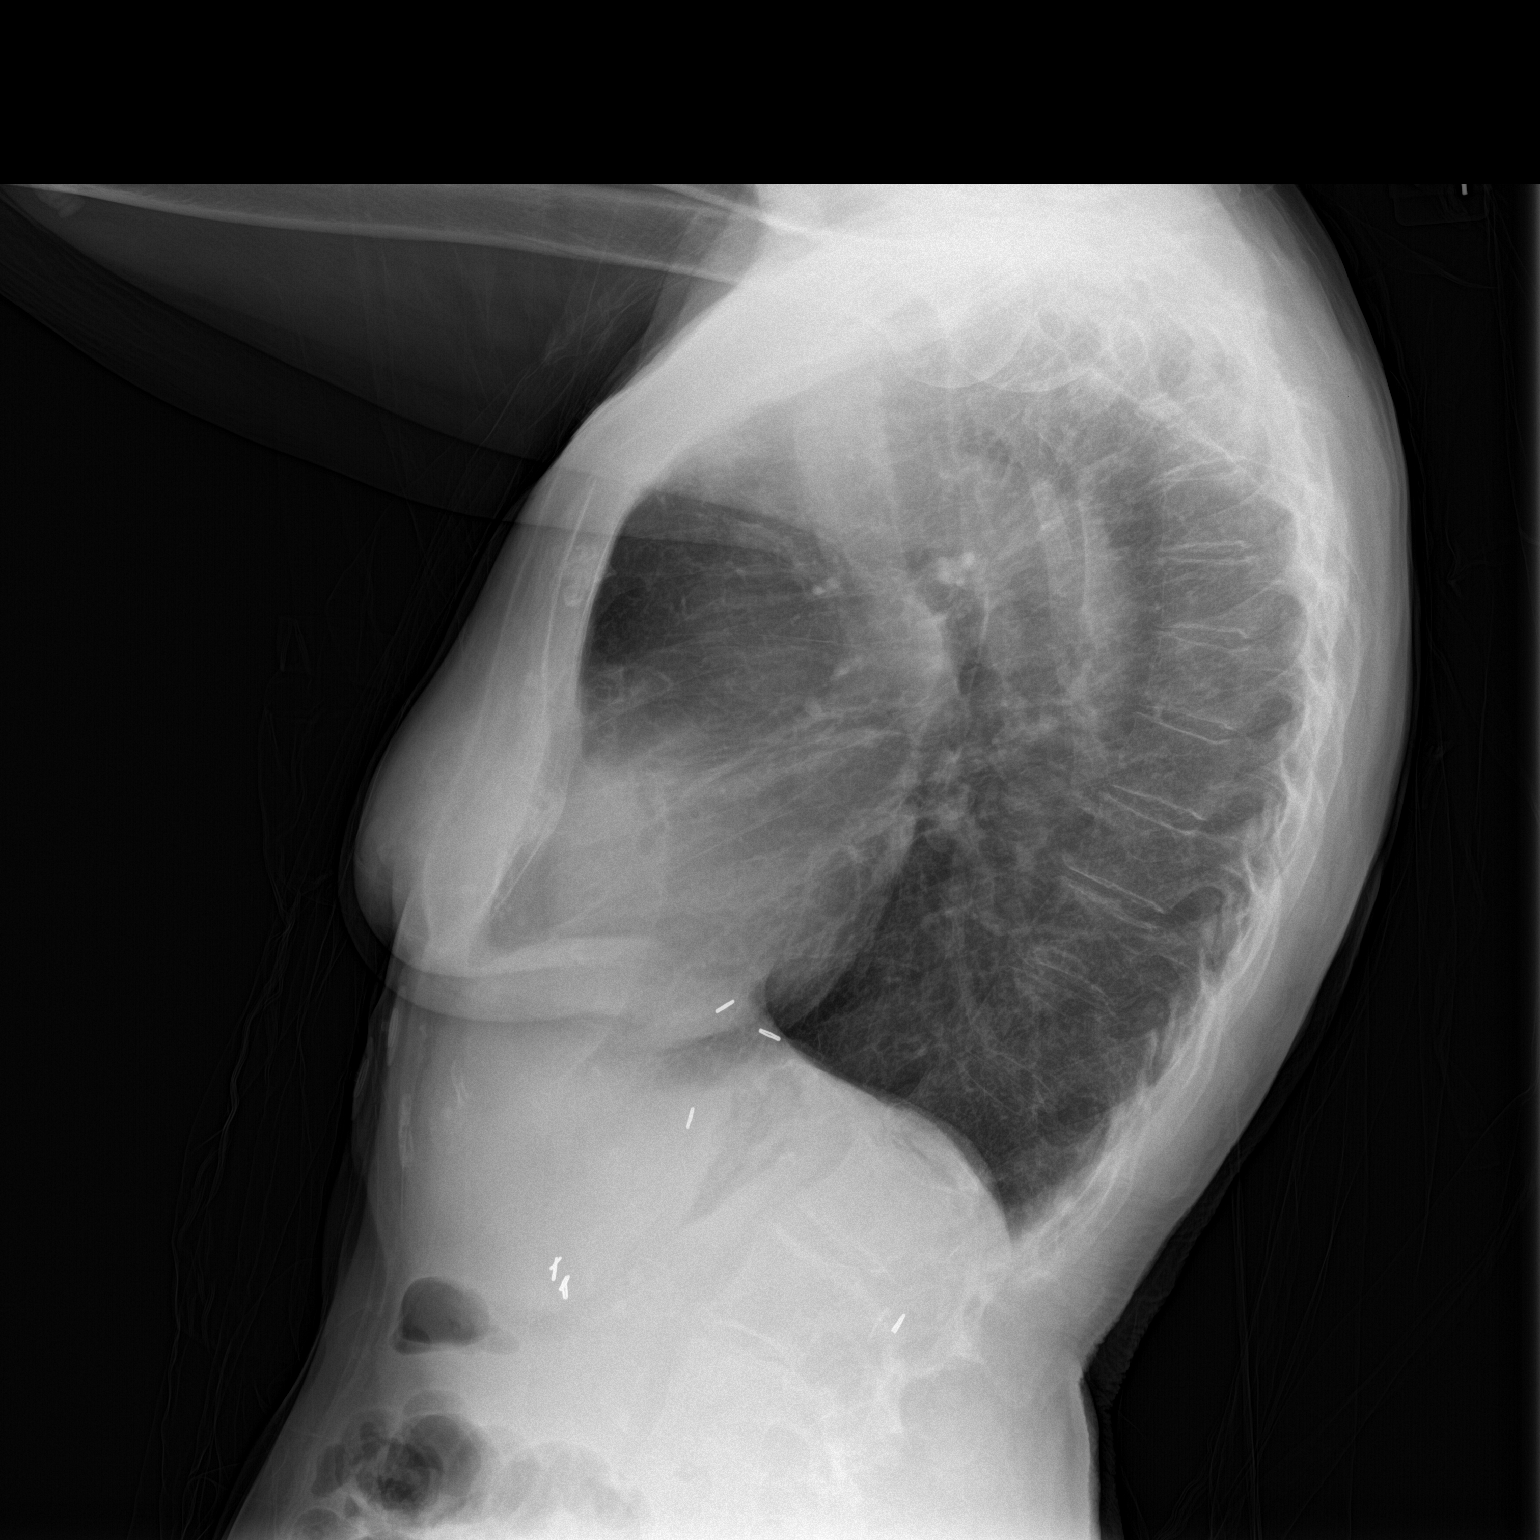

[2 of 2 positions shown; findings below may reference images not displayed]

FINDINGS: Cardiac shadow is stable. Minimal scarring is noted in the left mid
lung. No focal infiltrate or sizable effusion is seen. No acute bony
abnormality is noted.
IMPRESSION: No active cardiopulmonary disease.

## 2015-06-19 ENCOUNTER — Other Ambulatory Visit (HOSPITAL_BASED_OUTPATIENT_CLINIC_OR_DEPARTMENT_OTHER): Payer: Medicare HMO

## 2015-06-19 DIAGNOSIS — D509 Iron deficiency anemia, unspecified: Secondary | ICD-10-CM

## 2015-06-19 LAB — CBC & DIFF AND RETIC
BASO%: 0.9 % (ref 0.0–2.0)
Basophils Absolute: 0.1 10*3/uL (ref 0.0–0.1)
EOS ABS: 2.1 10*3/uL — AB (ref 0.0–0.5)
EOS%: 24.5 % — ABNORMAL HIGH (ref 0.0–7.0)
HCT: 43.7 % (ref 34.8–46.6)
HEMOGLOBIN: 14.7 g/dL (ref 11.6–15.9)
IMMATURE RETIC FRACT: 6.4 % (ref 1.60–10.00)
LYMPH%: 18.1 % (ref 14.0–49.7)
MCH: 33 pg (ref 25.1–34.0)
MCHC: 33.6 g/dL (ref 31.5–36.0)
MCV: 98 fL (ref 79.5–101.0)
MONO#: 0.4 10*3/uL (ref 0.1–0.9)
MONO%: 4.3 % (ref 0.0–14.0)
NEUT%: 52.2 % (ref 38.4–76.8)
NEUTROS ABS: 4.5 10*3/uL (ref 1.5–6.5)
Platelets: 262 10*3/uL (ref 145–400)
RBC: 4.46 10*6/uL (ref 3.70–5.45)
RDW: 13 % (ref 11.2–14.5)
Retic %: 1.64 % (ref 0.70–2.10)
Retic Ct Abs: 73.14 10*3/uL (ref 33.70–90.70)
WBC: 8.6 10*3/uL (ref 3.9–10.3)
lymph#: 1.6 10*3/uL (ref 0.9–3.3)

## 2015-06-19 LAB — COMPREHENSIVE METABOLIC PANEL
ALBUMIN: 3.3 g/dL — AB (ref 3.5–5.0)
ALK PHOS: 47 U/L (ref 40–150)
ALT: 14 U/L (ref 0–55)
AST: 19 U/L (ref 5–34)
Anion Gap: 5 mEq/L (ref 3–11)
BILIRUBIN TOTAL: 0.65 mg/dL (ref 0.20–1.20)
BUN: 16.2 mg/dL (ref 7.0–26.0)
CO2: 27 mEq/L (ref 22–29)
CREATININE: 0.7 mg/dL (ref 0.6–1.1)
Calcium: 8.8 mg/dL (ref 8.4–10.4)
Chloride: 107 mEq/L (ref 98–109)
EGFR: 78 mL/min/{1.73_m2} — AB (ref >90)
GLUCOSE: 115 mg/dL (ref 70–140)
Potassium: 4.6 mEq/L (ref 3.5–5.1)
SODIUM: 139 meq/L (ref 136–145)
TOTAL PROTEIN: 6.2 g/dL — AB (ref 6.4–8.3)

## 2015-06-19 LAB — FERRITIN: Ferritin: 252 ng/ml (ref 9–269)

## 2015-08-29 ENCOUNTER — Telehealth: Payer: Self-pay | Admitting: Hematology

## 2015-08-29 NOTE — Telephone Encounter (Signed)
Called patient to confirm appointment. Left message. Appointment letter and schedule mailed. Maria F. °

## 2015-09-18 ENCOUNTER — Ambulatory Visit: Payer: Medicare HMO | Admitting: Hematology

## 2015-10-05 ENCOUNTER — Telehealth: Payer: Self-pay | Admitting: Hematology

## 2015-10-05 ENCOUNTER — Ambulatory Visit (HOSPITAL_BASED_OUTPATIENT_CLINIC_OR_DEPARTMENT_OTHER): Payer: Medicare HMO | Admitting: Hematology

## 2015-10-05 ENCOUNTER — Ambulatory Visit (HOSPITAL_BASED_OUTPATIENT_CLINIC_OR_DEPARTMENT_OTHER): Payer: Medicare HMO

## 2015-10-05 ENCOUNTER — Encounter: Payer: Self-pay | Admitting: Hematology

## 2015-10-05 VITALS — BP 176/68 | HR 73 | Temp 97.8°F | Resp 18 | Wt 103.7 lb

## 2015-10-05 DIAGNOSIS — E538 Deficiency of other specified B group vitamins: Secondary | ICD-10-CM

## 2015-10-05 DIAGNOSIS — D509 Iron deficiency anemia, unspecified: Secondary | ICD-10-CM | POA: Diagnosis not present

## 2015-10-05 LAB — COMPREHENSIVE METABOLIC PANEL
ALT: <9 U/L (ref 0–55)
ANION GAP: 7 meq/L (ref 3–11)
AST: 16 U/L (ref 5–34)
Albumin: 3 g/dL — ABNORMAL LOW (ref 3.5–5.0)
Alkaline Phosphatase: 52 U/L (ref 40–150)
BILIRUBIN TOTAL: 0.65 mg/dL (ref 0.20–1.20)
BUN: 13 mg/dL (ref 7.0–26.0)
CHLORIDE: 107 meq/L (ref 98–109)
CO2: 27 meq/L (ref 22–29)
Calcium: 8.6 mg/dL (ref 8.4–10.4)
Creatinine: 0.7 mg/dL (ref 0.6–1.1)
EGFR: 79 mL/min/{1.73_m2} — AB (ref >90)
Glucose: 103 mg/dl (ref 70–140)
Potassium: 4.5 mEq/L (ref 3.5–5.1)
Sodium: 141 mEq/L (ref 136–145)
Total Protein: 5.7 g/dL — ABNORMAL LOW (ref 6.4–8.3)

## 2015-10-05 LAB — CBC & DIFF AND RETIC
BASO%: 1.1 % (ref 0.0–2.0)
Basophils Absolute: 0.1 10*3/uL (ref 0.0–0.1)
EOS%: 21.3 % — AB (ref 0.0–7.0)
Eosinophils Absolute: 1.8 10*3/uL — ABNORMAL HIGH (ref 0.0–0.5)
HCT: 41.1 % (ref 34.8–46.6)
HGB: 13.8 g/dL (ref 11.6–15.9)
Immature Retic Fract: 4.6 % (ref 1.60–10.00)
LYMPH%: 17.9 % (ref 14.0–49.7)
MCH: 32.7 pg (ref 25.1–34.0)
MCHC: 33.6 g/dL (ref 31.5–36.0)
MCV: 97.4 fL (ref 79.5–101.0)
MONO#: 0.4 10*3/uL (ref 0.1–0.9)
MONO%: 5.2 % (ref 0.0–14.0)
NEUT#: 4.6 10*3/uL (ref 1.5–6.5)
NEUT%: 54.5 % (ref 38.4–76.8)
PLATELETS: 264 10*3/uL (ref 145–400)
RBC: 4.22 10*6/uL (ref 3.70–5.45)
RDW: 12.7 % (ref 11.2–14.5)
Retic %: 1.59 % (ref 0.70–2.10)
Retic Ct Abs: 67.1 10*3/uL (ref 33.70–90.70)
WBC: 8.5 10*3/uL (ref 3.9–10.3)
lymph#: 1.5 10*3/uL (ref 0.9–3.3)

## 2015-10-05 LAB — FERRITIN: FERRITIN: 99 ng/mL (ref 9–269)

## 2015-10-05 LAB — IRON AND TIBC
%SAT: 40 % (ref 21–57)
IRON: 107 ug/dL (ref 41–142)
TIBC: 269 ug/dL (ref 236–444)
UIBC: 162 ug/dL (ref 120–384)

## 2015-10-05 NOTE — Telephone Encounter (Signed)
PATIENT SENT BACK TO LAB AND GIVEN AVS REPORT AND APPOINTMENTS FOR MARCH

## 2015-10-06 LAB — VITAMIN B12: VITAMIN B 12: 503 pg/mL (ref 211–946)

## 2015-10-22 ENCOUNTER — Other Ambulatory Visit: Payer: Self-pay | Admitting: *Deleted

## 2015-10-22 ENCOUNTER — Telehealth: Payer: Self-pay | Admitting: *Deleted

## 2015-10-22 DIAGNOSIS — D509 Iron deficiency anemia, unspecified: Secondary | ICD-10-CM

## 2015-10-22 NOTE — Progress Notes (Signed)
[name]    HEMATOLOGY/ONCOLOGY CONSULTATION NOTE  Date of Service: 10/22/2015  Patient Care Team: [name], MD as PCP - General (Internal Medicine)[place]  CHIEF COMPLAINTS/PURPOSE OF CONSULTATION:  Follow up for iron deficiency anemia  Diagnosis: 1) Iron deficiency anemia due to chronic GI blood loss requiring intermitent IV iron infusions. Last received this on 03/28/2015 and 04/04/2015. 2) B12 deficiency on po B complex replacement.   INTERVAL HISTORY Ms Malsam is here for her scheduled followup for iron deficiency anemia.  She notes no acute new fatigue.  No significant changes since her last visit 6 months ago.  No overt evidence of melena hematochezia or other GI bleeding. He has continued to take her B complex pills daily. Labs today show a normal hemoglobin of 13.8 with an MCV of 97, normal WBC and platelet counts. Ferritin level is down from 252 to 99 with 40% and saturation.   MEDICAL HISTORY:  Past Medical History:  Diagnosis Date  . Anemia    iron def  . B12 deficiency 08/10/2010  . Cataract    bilateral  . Gait disorder 04/13/2014  . Hypertension   . Iron deficiency anemia 08/10/2010  . Thyroid disease    hypothyroid  Partial gastrectomy (for bleeding stomach ulcer 11 units)  SURGICAL HISTORY: Past Surgical History:  Procedure Laterality Date  . ABDOMINAL HYSTERECTOMY  1975  . APPENDECTOMY  1955  . CHOLECYSTECTOMY  1978  . ESOPHAGEAL DILATION  2001  . ESOPHAGEAL DILATION  2004  . VAGOTOMY  1971  . VAGOTOMY      SOCIAL HISTORY: Social History   Social History  . Marital status: Married    Spouse name: Lauraann Shibuya  . Number of children: 2  . Years of education: 12   Occupational History  . retired    Social History Main Topics  . Smoking status: Never Smoker  . Smokeless tobacco: Never Used  . Alcohol use 4.2 oz/week    7 Glasses of wine per week     Comment: occ  . Drug use: No  . Sexual activity: Not on file   Other Topics Concern  .  Not on file   Social History Narrative   Patient is right handed.   Patient drinks 1 cup of caffeine per day.    FAMILY HISTORY: Family History  Problem Relation Age of Onset  . Cancer Sister   . Hypertension Brother   . Heart disease Father     ALLERGIES:  is allergic to fergon [ferrous gluconate]; ampicillin; cephalexin; codeine; famotidine; nizatidine; tagamet [cimetidine]; and salmon [fish allergy].  MEDICATIONS:  Current Outpatient Prescriptions  Medication Sig Dispense Refill  . acetaminophen (TYLENOL) 325 MG tablet Take 650 mg by mouth every 6 (six) hours as needed. For aches/pains    . Ascorbic Acid (VITAMIN C) 500 MG CAPS Take 500 mg by mouth 4 (four) times daily.      [name] b complex vitamins capsule Take 1 capsule by mouth daily.    . Calcium Carbonate-Vitamin D (CALCIUM 500 + D PO) Take by mouth. 3 tablets daily    . diphenhydrAMINE (BENADRYL) 25 mg capsule Take 25 mg by mouth at bedtime as needed. For allergies    . estradiol (ESTRACE) 1 MG tablet Take 1 mg by mouth daily.    [name] levothyroxine (SYNTHROID, LEVOTHROID) 112 MCG tablet Take 112 mcg by mouth daily before breakfast.    . loperamide (IMODIUM) 2 MG capsule Take 4 mg by mouth 4 (four) times daily as  needed for diarrhea or loose stools.     [name] losartan (COZAAR) 25 MG tablet Take 25 mg by mouth daily. Takes 1/2 tablet daily     . omeprazole (PRILOSEC) 20 MG capsule Take 20 mg by mouth 2 (two) times daily.      No current facility-administered medications for this visit.     REVIEW OF SYSTEMS:    10 Point review of Systems was done is negative except as noted above.  PHYSICAL EXAMINATION: ECOG PERFORMANCE STATUS: 2 - Symptomatic, <50% confined to bed  . Vitals:   10/05/15 1012  BP: (!) 176/68  Pulse: 73  Resp: 18  Temp: 97.8 F (36.6 C)   Filed Weights   10/05/15 1012  Weight: 103 lb 11.2 oz (47 kg)   .Body mass index is 18.97 kg/m.  GENERAL:alert, in no acute distress and comfortable SKIN: skin  color, texture, turgor are normal, no rashes or significant lesions EYES: normal, conjunctiva are pink and non-injected, sclera clear OROPHARYNX:no exudate, no erythema and lips, buccal mucosa, and tongue normal  NECK: supple, no JVD, thyroid normal size, non-tender, without nodularity LYMPH:  no palpable lymphadenopathy in the cervical, axillary or inguinal LUNGS: clear to auscultation with normal respiratory effort HEART: regular rate & rhythm,  no murmurs and no lower extremity edema ABDOMEN: abdomen soft, non-tender, normoactive bowel sounds  Musculoskeletal: no cyanosis of digits and no clubbing  PSYCH: alert & oriented x 3 with fluent speech NEURO: no focal motor/sensory deficits  LABORATORY DATA:  I have reviewed the data as listed  . CBC Latest Ref Rng & Units 10/05/2015 06/19/2015 03/21/2015  WBC 3.9 - 10.3 10e3/uL 8.5 8.6 8.6  Hemoglobin 11.6 - 15.9 g/dL 13.8 14.7 13.3  Hematocrit 34.8 - 46.6 % 41.1 43.7 39.3  Platelets 145 - 400 10e3/uL 264 262 311    . CMP Latest Ref Rng & Units 10/05/2015 06/19/2015 03/21/2015  Glucose 70 - 140 mg/dl 103 115 106  BUN 7.0 - 26.0 mg/dL 13.0 16.2 17.5  Creatinine 0.6 - 1.1 mg/dL 0.7 0.7 0.8  Sodium 136 - 145 mEq/L 141 139 140  Potassium 3.5 - 5.1 mEq/L 4.5 4.6 4.1  Chloride 96 - 112 mmol/L - - -  CO2 22 - 29 mEq/L 27 27 28   Calcium 8.4 - 10.4 mg/dL 8.6 8.8 8.7  Total Protein 6.4 - 8.3 g/dL 5.7(L) 6.2(L) 6.0(L)  Total Bilirubin 0.20 - 1.20 mg/dL 0.65 0.65 0.33  Alkaline Phos 40 - 150 U/L 52 47 61  AST 5 - 34 U/L 16 19 15   ALT 0 - 55 U/L 9 14 11    . Lab Results  Component Value Date   IRON 107 10/05/2015   TIBC 269 10/05/2015   IRONPCTSAT 40 10/05/2015   (Iron and TIBC)  Lab Results  Component Value Date   FERRITIN 99 10/05/2015    B12 - 503 RADIOGRAPHIC STUDIES: I have personally reviewed the radiological images as listed and agreed with the findings in the report. No results found.  ASSESSMENT & PLAN:   82 yo caucasian  female with   1) CHronic iron deficiency anemia due to chronic GI blood loss and likely issues with absorption related to partial gastrectomy. Also patient has had intolerance to po iron. Ferritin levels are gradually downtrending But reasonable at this time at 99, with iron saturation of 40%. 2) h/o B12 deficiency - level stable currently with B complex replacement. Plan -no indication for IV iron at this time. -will continue to monitor cbc,  cmp and iron labs q 64months and replace iron IV to maintain ferritin >100 -continue B complex  - if declining B12 levels would need to add SL b12 replacement in addition as well. (ok at this time) -rpt labs in 3 months and clinic visit in 6 months.  RTC with Dr Irene Limbo in 6 months with cbc, cmp and ferritin. Earlier if any new concerns/questions.  All of the patients questions were answered with apparent satisfaction. The patient knows to call the clinic with any problems, questions or concerns.  I spent 20 minutes counseling the patient face to face. The total time spent in the appointment was 20 minutes and more than 50% was on counseling and direct patient cares.    Sullivan Lone MD [place] AAHIVMS [facility] Hematology/Oncology Physician [facility]  (Office):       [phone] (Work cell):  [phone] (Fax):           [phone]

## 2015-10-22 NOTE — Telephone Encounter (Signed)
Called patient and lvm per Dr. Irene Limbo to inform iron levels are stable at 99 at this time.  No need for IV iron.  Pt to return in 3 months to recheck labs.  Message sent to schedulers.

## 2015-12-11 ENCOUNTER — Telehealth: Payer: Self-pay | Admitting: Hematology

## 2015-12-11 NOTE — Telephone Encounter (Signed)
Patient's husband called to have 3 month lab added to appointments. Scheduled per 9/18 LOS

## 2015-12-19 ENCOUNTER — Other Ambulatory Visit (HOSPITAL_BASED_OUTPATIENT_CLINIC_OR_DEPARTMENT_OTHER): Payer: Medicare HMO

## 2015-12-19 DIAGNOSIS — D509 Iron deficiency anemia, unspecified: Secondary | ICD-10-CM

## 2015-12-19 DIAGNOSIS — E538 Deficiency of other specified B group vitamins: Secondary | ICD-10-CM

## 2015-12-19 LAB — CBC & DIFF AND RETIC
BASO%: 0.9 % (ref 0.0–2.0)
BASOS ABS: 0.1 10*3/uL (ref 0.0–0.1)
EOS%: 30.7 % — AB (ref 0.0–7.0)
Eosinophils Absolute: 2.9 10*3/uL — ABNORMAL HIGH (ref 0.0–0.5)
HEMATOCRIT: 40.5 % (ref 34.8–46.6)
HGB: 13.3 g/dL (ref 11.6–15.9)
Immature Retic Fract: 3.2 % (ref 1.60–10.00)
LYMPH%: 17 % (ref 14.0–49.7)
MCH: 32 pg (ref 25.1–34.0)
MCHC: 32.8 g/dL (ref 31.5–36.0)
MCV: 97.6 fL (ref 79.5–101.0)
MONO#: 0.4 10*3/uL (ref 0.1–0.9)
MONO%: 3.9 % (ref 0.0–14.0)
NEUT%: 47.5 % (ref 38.4–76.8)
NEUTROS ABS: 4.5 10*3/uL (ref 1.5–6.5)
Platelets: 267 10*3/uL (ref 145–400)
RBC: 4.15 10*6/uL (ref 3.70–5.45)
RDW: 13 % (ref 11.2–14.5)
RETIC %: 1.5 % (ref 0.70–2.10)
Retic Ct Abs: 62.25 10*3/uL (ref 33.70–90.70)
WBC: 9.5 10*3/uL (ref 3.9–10.3)
lymph#: 1.6 10*3/uL (ref 0.9–3.3)

## 2015-12-19 LAB — COMPREHENSIVE METABOLIC PANEL
ALT: 12 U/L (ref 0–55)
AST: 19 U/L (ref 5–34)
Albumin: 2.8 g/dL — ABNORMAL LOW (ref 3.5–5.0)
Alkaline Phosphatase: 57 U/L (ref 40–150)
Anion Gap: 8 mEq/L (ref 3–11)
BUN: 16.9 mg/dL (ref 7.0–26.0)
CALCIUM: 8.5 mg/dL (ref 8.4–10.4)
CHLORIDE: 105 meq/L (ref 98–109)
CO2: 25 mEq/L (ref 22–29)
Creatinine: 0.8 mg/dL (ref 0.6–1.1)
EGFR: 67 mL/min/{1.73_m2} — AB (ref >90)
Glucose: 110 mg/dl (ref 70–140)
POTASSIUM: 4.1 meq/L (ref 3.5–5.1)
Sodium: 137 mEq/L (ref 136–145)
Total Bilirubin: 0.6 mg/dL (ref 0.20–1.20)
Total Protein: 5.6 g/dL — ABNORMAL LOW (ref 6.4–8.3)

## 2015-12-19 LAB — FERRITIN: FERRITIN: 55 ng/mL (ref 9–269)

## 2015-12-20 LAB — VITAMIN B12: Vitamin B12: 544 pg/mL (ref 211–946)

## 2016-01-22 ENCOUNTER — Encounter (HOSPITAL_COMMUNITY): Payer: Self-pay

## 2016-01-22 ENCOUNTER — Emergency Department (HOSPITAL_COMMUNITY): Payer: Medicare HMO

## 2016-01-22 ENCOUNTER — Emergency Department (HOSPITAL_COMMUNITY)
Admission: EM | Admit: 2016-01-22 | Discharge: 2016-01-22 | Disposition: A | Discharge status: Home / Self Care | Payer: Medicare HMO | Attending: Emergency Medicine | Admitting: Emergency Medicine

## 2016-01-22 DIAGNOSIS — E039 Hypothyroidism, unspecified: Secondary | ICD-10-CM | POA: Diagnosis not present

## 2016-01-22 DIAGNOSIS — I10 Essential (primary) hypertension: Secondary | ICD-10-CM | POA: Insufficient documentation

## 2016-01-22 DIAGNOSIS — I159 Secondary hypertension, unspecified: Secondary | ICD-10-CM | POA: Insufficient documentation

## 2016-01-22 DIAGNOSIS — Z79899 Other long term (current) drug therapy: Secondary | ICD-10-CM | POA: Diagnosis not present

## 2016-01-22 DIAGNOSIS — R0689 Other abnormalities of breathing: Secondary | ICD-10-CM | POA: Diagnosis not present

## 2016-01-22 DIAGNOSIS — R791 Abnormal coagulation profile: Secondary | ICD-10-CM | POA: Diagnosis not present

## 2016-01-22 DIAGNOSIS — R42 Dizziness and giddiness: Secondary | ICD-10-CM | POA: Diagnosis not present

## 2016-01-22 LAB — DIFFERENTIAL
BASOS ABS: 0.1 10*3/uL (ref 0.0–0.1)
BASOS PCT: 2 %
EOS ABS: 1.6 10*3/uL — AB (ref 0.0–0.7)
Eosinophils Relative: 18 %
Lymphocytes Relative: 18 %
Lymphs Abs: 1.6 10*3/uL (ref 0.7–4.0)
MONOS PCT: 5 %
Monocytes Absolute: 0.4 10*3/uL (ref 0.1–1.0)
NEUTROS PCT: 59 %
Neutro Abs: 5.3 10*3/uL (ref 1.7–7.7)

## 2016-01-22 LAB — COMPREHENSIVE METABOLIC PANEL
ALT: 13 U/L — AB (ref 14–54)
AST: 20 U/L (ref 15–41)
Albumin: 3.1 g/dL — ABNORMAL LOW (ref 3.5–5.0)
Alkaline Phosphatase: 41 U/L (ref 38–126)
Anion gap: 7 (ref 5–15)
BUN: 15 mg/dL (ref 6–20)
CHLORIDE: 106 mmol/L (ref 101–111)
CO2: 25 mmol/L (ref 22–32)
CREATININE: 0.75 mg/dL (ref 0.44–1.00)
Calcium: 8.6 mg/dL — ABNORMAL LOW (ref 8.9–10.3)
GFR calc Af Amer: >60 mL/min (ref >60)
Glucose, Bld: 105 mg/dL — ABNORMAL HIGH (ref 65–99)
POTASSIUM: 3.8 mmol/L (ref 3.5–5.1)
SODIUM: 138 mmol/L (ref 135–145)
Total Bilirubin: 0.8 mg/dL (ref 0.3–1.2)
Total Protein: 5.5 g/dL — ABNORMAL LOW (ref 6.5–8.1)

## 2016-01-22 LAB — I-STAT TROPONIN, ED: TROPONIN I, POC: 0 ng/mL (ref 0.00–0.08)

## 2016-01-22 LAB — APTT: aPTT: 24 seconds (ref 24–36)

## 2016-01-22 LAB — I-STAT CHEM 8, ED
BUN: 18 mg/dL (ref 6–20)
CHLORIDE: 104 mmol/L (ref 101–111)
CREATININE: 0.7 mg/dL (ref 0.44–1.00)
Calcium, Ion: 1.12 mmol/L — ABNORMAL LOW (ref 1.15–1.40)
GLUCOSE: 101 mg/dL — AB (ref 65–99)
HEMATOCRIT: 42 % (ref 36.0–46.0)
HEMOGLOBIN: 14.3 g/dL (ref 12.0–15.0)
POTASSIUM: 3.8 mmol/L (ref 3.5–5.1)
Sodium: 137 mmol/L (ref 135–145)
TCO2: 23 mmol/L (ref 0–100)

## 2016-01-22 LAB — CBC
HEMATOCRIT: 41.9 % (ref 36.0–46.0)
Hemoglobin: 14.1 g/dL (ref 12.0–15.0)
MCH: 32.6 pg (ref 26.0–34.0)
MCHC: 33.7 g/dL (ref 30.0–36.0)
MCV: 97 fL (ref 78.0–100.0)
PLATELETS: 250 10*3/uL (ref 150–400)
RBC: 4.32 MIL/uL (ref 3.87–5.11)
RDW: 13 % (ref 11.5–15.5)
WBC: 9.1 10*3/uL (ref 4.0–10.5)

## 2016-01-22 LAB — PROTIME-INR
INR: 1.03
PROTHROMBIN TIME: 13.5 s (ref 11.4–15.2)

## 2016-01-22 LAB — BRAIN NATRIURETIC PEPTIDE: B NATRIURETIC PEPTIDE 5: 83.2 pg/mL (ref 0.0–100.0)

## 2016-01-22 NOTE — ED Triage Notes (Signed)
Per Pt, Pt is coming from home with complaints of dizziness that started this morning when she woke up that has not subsided. Husband reports fatigue and some slurred speech for the past month. Pt denies any associated symptoms at this time. Denies Numbness or tingling. Reports darkened vision bilaterally. Hx of unsteady gait.

## 2016-01-22 NOTE — ED Notes (Signed)
Walked patient to the bathroom  

## 2016-01-22 NOTE — ED Notes (Signed)
On Thursday, Pt reports that she had an episode where she was unable to remember how to put the car in reverse.

## 2016-01-22 NOTE — ED Provider Notes (Signed)
[place] DEPT [facility] Provider Note   CSN: [id number] Arrival date & time: 01/22/16  1044     History   Chief Complaint Chief Complaint  Patient presents with  . Dizziness    HPI [name] [name] is a 82 y.o. female.   Dizziness  Quality:  Lightheadedness Severity:  Moderate Onset quality:  Gradual Duration:  3 hours Timing:  Constant Chronicity:  New Relieved by:  None tried Worsened by:  Nothing Ineffective treatments:  None tried Associated symptoms: no blood in stool, no chest pain, no nausea, no shortness of breath and no tinnitus     Past Medical History:  Diagnosis Date  . Anemia    iron def  . B12 deficiency 08/10/2010  . Cataract    bilateral  . Gait disorder 04/13/2014  . Hypertension   . Iron deficiency anemia 08/10/2010  . Thyroid disease    hypothyroid    Patient Active Problem List   Diagnosis Date Noted  . Gait disorder 04/13/2014  . Eosinophilia 07/15/2013  . Iron deficiency anemia 08/10/2010  . B12 deficiency 08/10/2010    Past Surgical History:  Procedure Laterality Date  . ABDOMINAL HYSTERECTOMY  1975  . APPENDECTOMY  1955  . CHOLECYSTECTOMY  1978  . ESOPHAGEAL DILATION  2001  . ESOPHAGEAL DILATION  2004  . VAGOTOMY  1971  . VAGOTOMY      OB History    No data available       Home Medications    Prior to Admission medications   Medication Sig Start Date End Date Taking? Authorizing Provider  acetaminophen (TYLENOL) 325 MG tablet Take 650 mg by mouth every 6 (six) hours as needed. For aches/pains   Yes Historical Provider, MD  Ascorbic Acid (VITAMIN C) 500 MG CAPS Take 500 mg by mouth 4 (four) times daily.     Yes Historical Provider, MD  b complex vitamins capsule Take 1 capsule by mouth daily.   Yes Historical Provider, MD  Calcium Carbonate-Vitamin D (CALCIUM 500 + D PO) Take by mouth. 3 tablets daily   Yes Historical Provider, MD  diphenhydrAMINE (BENADRYL) 25 mg capsule Take 25 mg by mouth at bedtime as needed. For  allergies   Yes Historical Provider, MD  estradiol (ESTRACE) 1 MG tablet Take 1 mg by mouth daily.   Yes Historical Provider, MD  levothyroxine (SYNTHROID, LEVOTHROID) 112 MCG tablet Take 112 mcg by mouth daily before breakfast.   Yes Historical Provider, MD  loperamide (IMODIUM) 2 MG capsule Take 4 mg by mouth 4 (four) times daily as needed for diarrhea or loose stools.    Yes Historical Provider, MD  losartan (COZAAR) 25 MG tablet Take 12.5 mg by mouth daily. Takes 1/2 tablet daily    Yes Historical Provider, MD  omeprazole (PRILOSEC) 20 MG capsule Take 20 mg by mouth 2 (two) times daily.  05/20/12  Yes Historical Provider, MD    Family History Family History  Problem Relation Age of Onset  . Cancer Sister   . Hypertension Brother   . Heart disease Father     Social History Social History  Substance Use Topics  . Smoking status: Never Smoker  . Smokeless tobacco: Never Used  . Alcohol use 4.2 oz/week    7 Glasses of wine per week     Comment: occ     Allergies   Fergon [ferrous gluconate]; Ampicillin; Cephalexin; Codeine; Famotidine; Nizatidine; Tagamet [cimetidine]; and Hyman Hopes allergy]   Review of Systems Review of Systems  Constitutional: Negative for chills.  HENT: Negative for tinnitus.   Respiratory: Negative for shortness of breath.   Cardiovascular: Negative for chest pain.  Gastrointestinal: Negative for blood in stool and nausea.  Endocrine: Negative for polydipsia and polyuria.  Neurological: Positive for dizziness.  All other systems reviewed and are negative.    Physical Exam Updated Vital Signs BP (!) 136/121 (BP Location: Right Arm)   Pulse 79   Temp 97.9 F (36.6 C) (Oral)   Resp 22   Ht 5\' 3"  (1.6 m)   Wt 102 lb (46.3 kg)   SpO2 98%   BMI 18.07 kg/m   Physical Exam  Constitutional: She is oriented to person, place, and time. She appears well-developed and well-nourished.  HENT:  Head: Normocephalic and atraumatic.  Eyes: Conjunctivae  and EOM are normal.  Neck: Normal range of motion.  Cardiovascular: Normal rate and regular rhythm.   Pulmonary/Chest: Effort normal and breath sounds normal. No stridor. No respiratory distress. She has no wheezes.  Abdominal: Soft. She exhibits no distension.  Musculoskeletal: Normal range of motion. She exhibits no edema or deformity.  Neurological: She is alert and oriented to person, place, and time.  No altered mental status, able to give full seemingly accurate history.  Face is symmetric, EOM's intact, pupils equal and reactive, vision intact, tongue and uvula midline without deviation Upper and Lower extremity motor 5/5, intact pain perception in distal extremities, 2+ reflexes in biceps, patella and achilles tendons. Finger to nose normal, heel to shin normal. Walks without assistance or evident ataxia.   Skin: Skin is warm and dry. No erythema.  Nursing note and vitals reviewed.    ED Treatments / Results  Labs (all labs ordered are listed, but only abnormal results are displayed) Labs Reviewed  DIFFERENTIAL - Abnormal; Notable for the following:       Result Value   Eosinophils Absolute 1.6 (*)    All other components within normal limits  COMPREHENSIVE METABOLIC PANEL - Abnormal; Notable for the following:    Glucose, Bld 105 (*)    Calcium 8.6 (*)    Total Protein 5.5 (*)    Albumin 3.1 (*)    ALT 13 (*)    All other components within normal limits  I-STAT CHEM 8, ED - Abnormal; Notable for the following:    Glucose, Bld 101 (*)    Calcium, Ion 1.12 (*)    All other components within normal limits  PROTIME-INR  APTT  CBC  BRAIN NATRIURETIC PEPTIDE  I-STAT TROPOININ, ED    EKG  EKG Interpretation  Date/Time:  Tuesday January 22 2016 10:57:23 EST Ventricular Rate:  72 PR Interval:  166 QRS Duration: 74 QT Interval:  368 QTC Calculation: 402 R Axis:   76 Text Interpretation:  Normal sinus rhythm Cannot rule out Anterior infarct , age undetermined  Abnormal ECG Confirmed by [facility] MD, Corene Cornea [phone]) on 01/23/2016 9:11:15 AM       Radiology Ct Head Wo Contrast  Result Date: 01/22/2016 CLINICAL DATA:  Dizziness starting this morning EXAM: CT HEAD WITHOUT CONTRAST TECHNIQUE: Contiguous axial images were obtained from the base of the skull through the vertex without intravenous contrast. COMPARISON:  05/24/2014 FINDINGS: Brain: The brainstem, cerebellum, cerebral peduncles, thalami, basal ganglia, basilar cisterns, and ventricular system appear within normal limits. Periventricular white matter and corona radiata hypodensities favor chronic ischemic microvascular white matter disease. No intracranial hemorrhage, mass lesion, or acute CVA. Vascular: There is atherosclerotic calcification of the cavernous carotid arteries  bilaterally. Skull: Unremarkable Sinuses/Orbits: Chronic right mastoid effusion posteriorly. Subtotal opacification of the right sphenoid sinus, chronic. Mild chronic ethmoid sinusitis. Other: No supplemental non-categorized findings. IMPRESSION: 1. No acute intracranial findings. 2. Periventricular white matter and corona radiata hypodensities favor chronic ischemic microvascular white matter disease. 3. Chronic ethmoid and sphenoid sinusitis with a chronic right mastoid effusion. Electronically Signed   By: Van Clines M.D.   On: 01/22/2016 11:40    Procedures Procedures (including critical care time)  Medications Ordered in ED Medications - No data to display   Initial Impression / Assessment and Plan / ED Course  I have reviewed the triage vital signs and the nursing notes.  Pertinent labs & imaging results that were available during my care of the patient were reviewed by me and considered in my medical decision making (see chart for details).  Clinical Course     Dizziness. Uncertain etiology. Suspect possibly related to BP as it is a bit higher than normal, but no e/o end organ damage. Discussed with her PCP  and will keep a log of BP's at home and ifConsistently over [id number] systolic she will double her antihypertensives and follow-up with Dr. Mancel Bale in office in a week or 2.  Final Clinical Impressions(s) / ED Diagnoses   Final diagnoses:  Dizziness  Secondary hypertension    New Prescriptions Discharge Medication List as of 01/22/2016  3:04 PM       Merrily Pew, MD 01/23/16 [phone]

## 2016-01-22 NOTE — ED Notes (Signed)
Placed patient on the monitor into a gown and on the monitor

## 2016-01-22 NOTE — ED Notes (Signed)
Pt is in stable condition upon d/c and is escorted from Ed via wheelchair. 

## 2016-02-12 DIAGNOSIS — I10 Essential (primary) hypertension: Secondary | ICD-10-CM | POA: Diagnosis not present

## 2016-04-04 ENCOUNTER — Ambulatory Visit (HOSPITAL_BASED_OUTPATIENT_CLINIC_OR_DEPARTMENT_OTHER): Payer: PPO | Admitting: Hematology

## 2016-04-04 ENCOUNTER — Other Ambulatory Visit (HOSPITAL_BASED_OUTPATIENT_CLINIC_OR_DEPARTMENT_OTHER): Payer: PPO

## 2016-04-04 ENCOUNTER — Telehealth: Payer: Self-pay | Admitting: Hematology

## 2016-04-04 ENCOUNTER — Encounter: Payer: Self-pay | Admitting: Hematology

## 2016-04-04 VITALS — BP 185/78 | HR 81 | Temp 97.8°F | Resp 18 | Ht 63.0 in | Wt 103.1 lb

## 2016-04-04 DIAGNOSIS — D500 Iron deficiency anemia secondary to blood loss (chronic): Secondary | ICD-10-CM | POA: Diagnosis not present

## 2016-04-04 DIAGNOSIS — E538 Deficiency of other specified B group vitamins: Secondary | ICD-10-CM

## 2016-04-04 DIAGNOSIS — D509 Iron deficiency anemia, unspecified: Secondary | ICD-10-CM | POA: Diagnosis not present

## 2016-04-04 LAB — CBC & DIFF AND RETIC
BASO%: 1.5 % (ref 0.0–2.0)
BASOS ABS: 0.1 10*3/uL (ref 0.0–0.1)
EOS%: 13.3 % — ABNORMAL HIGH (ref 0.0–7.0)
Eosinophils Absolute: 1.1 10*3/uL — ABNORMAL HIGH (ref 0.0–0.5)
HEMATOCRIT: 40 % (ref 34.8–46.6)
HGB: 13.4 g/dL (ref 11.6–15.9)
IMMATURE RETIC FRACT: 4 % (ref 1.60–10.00)
LYMPH#: 1.7 10*3/uL (ref 0.9–3.3)
LYMPH%: 21.1 % (ref 14.0–49.7)
MCH: 32.2 pg (ref 25.1–34.0)
MCHC: 33.5 g/dL (ref 31.5–36.0)
MCV: 96.2 fL (ref 79.5–101.0)
MONO#: 0.5 10*3/uL (ref 0.1–0.9)
MONO%: 6.1 % (ref 0.0–14.0)
NEUT#: 4.7 10*3/uL (ref 1.5–6.5)
NEUT%: 58 % (ref 38.4–76.8)
PLATELETS: 254 10*3/uL (ref 145–400)
RBC: 4.16 10*6/uL (ref 3.70–5.45)
RDW: 13.2 % (ref 11.2–14.5)
RETIC CT ABS: 69.89 10*3/uL (ref 33.70–90.70)
Retic %: 1.68 % (ref 0.70–2.10)
WBC: 8.1 10*3/uL (ref 3.9–10.3)

## 2016-04-04 LAB — FERRITIN: Ferritin: 23 ng/ml (ref 9–269)

## 2016-04-04 NOTE — Telephone Encounter (Signed)
Scheduled appts per 04/04/2016 los. Called patient. Patient is aware of times and day. Sending reminder letter in mail.

## 2016-04-05 LAB — VITAMIN B12: VITAMIN B 12: 626 pg/mL (ref 232–1245)

## 2016-04-06 NOTE — Progress Notes (Signed)
[name]    HEMATOLOGY/ONCOLOGY Clinic NOTE  Date of Service: .04/04/2016  Patient Care Team: [name], MD as PCP - [place] ([place] Medicine)[place]  CHIEF COMPLAINTS/PURPOSE OF CONSULTATION:  Follow up for iron deficiency anemia  Diagnosis: 1) Iron deficiency anemia due to chronic GI blood loss requiring intermitent IV iron infusions. Last received this on 03/28/2015 and 04/04/2015. 2) B12 deficiency on po B complex replacement.   INTERVAL HISTORY Ms Schorer is here for her scheduled followup for iron deficiency anemia.  She notes some fatigue but overall is good ok. No over GI bleeding or other blood loss noted. Has been taking her Iron polysaccharide once daily.  Her feritin level is progressively dropping and is now down to 23.  MEDICAL HISTORY:  Past Medical History:  Diagnosis Date  . Anemia    iron def  . B12 deficiency 08/10/2010  . Cataract    bilateral  . Gait disorder 04/13/2014  . Hypertension   . Iron deficiency anemia 08/10/2010  . Thyroid disease    hypothyroid  Partial gastrectomy (for bleeding stomach ulcer 11 units)  SURGICAL HISTORY: Past Surgical History:  Procedure Laterality Date  . ABDOMINAL HYSTERECTOMY  1975  . APPENDECTOMY  1955  . CHOLECYSTECTOMY  1978  . ESOPHAGEAL DILATION  2001  . ESOPHAGEAL DILATION  2004  . VAGOTOMY  1971  . VAGOTOMY      SOCIAL HISTORY: Social History   Social History  . Marital status: Married    Spouse name: Unica Krenzel  . Number of children: 2  . Years of education: 12   Occupational History  . retired    Social History Main Topics  . Smoking status: Never Smoker  . Smokeless tobacco: Never Used  . Alcohol use 4.2 oz/week    7 Glasses of wine per week     Comment: occ  . Drug use: No  . Sexual activity: Not on file   Other Topics Concern  . Not on file   Social History Narrative   Patient is right handed.   Patient drinks 1 cup of caffeine per day.    FAMILY HISTORY: Family History    Problem Relation Age of Onset  . Cancer Sister   . Hypertension Brother   . Heart disease Father     ALLERGIES:  is allergic to fergon [ferrous gluconate]; ampicillin; cephalexin; codeine; famotidine; nizatidine; tagamet [cimetidine]; and salmon [fish allergy].  MEDICATIONS:  Current Outpatient Prescriptions  Medication Sig Dispense Refill  . acetaminophen (TYLENOL) 325 MG tablet Take 650 mg by mouth every 6 (six) hours as needed. For aches/pains    . Ascorbic Acid (VITAMIN C) 500 MG CAPS Take 500 mg by mouth 4 (four) times daily.      [name] b complex vitamins capsule Take 1 capsule by mouth daily.    . Calcium Carbonate-Vitamin D (CALCIUM 500 + D PO) Take by mouth. 3 tablets daily    . diphenhydrAMINE (BENADRYL) 25 mg capsule Take 25 mg by mouth at bedtime as needed. For allergies    . estradiol (ESTRACE) 1 MG tablet Take 1 mg by mouth daily.    [name] levothyroxine (SYNTHROID, LEVOTHROID) 112 MCG tablet Take 112 mcg by mouth daily before breakfast.    . loperamide (IMODIUM) 2 MG capsule Take 4 mg by mouth 4 (four) times daily as needed for diarrhea or loose stools.     [name] losartan (COZAAR) 25 MG tablet Take 12.5 mg by mouth daily. Takes 1/2 tablet daily     .  omeprazole (PRILOSEC) 20 MG capsule Take 20 mg by mouth 2 (two) times daily.      No current facility-administered medications for this visit.     REVIEW OF SYSTEMS:    10 Point review of Systems was done is negative except as noted above.  PHYSICAL EXAMINATION: ECOG PERFORMANCE STATUS: 2 - Symptomatic, <50% confined to bed  . Vitals:   04/04/16 1052  BP: (!) 185/78  Pulse: 81  Resp: 18  Temp: 97.8 F (36.6 C)   Filed Weights   04/04/16 1052  Weight: 103 lb 1.6 oz (46.8 kg)   .Body mass index is 18.26 kg/m.  GENERAL:alert, in no acute distress and comfortable SKIN: skin color, texture, turgor are normal, no rashes or significant lesions EYES: normal, conjunctiva are pink and non-injected, sclera clear OROPHARYNX:no  exudate, no erythema and lips, buccal mucosa, and tongue normal  NECK: supple, no JVD, thyroid normal size, non-tender, without nodularity LYMPH:  no palpable lymphadenopathy in the cervical, axillary or inguinal LUNGS: clear to auscultation with normal respiratory effort HEART: regular rate & rhythm,  no murmurs and no lower extremity edema ABDOMEN: abdomen soft, non-tender, normoactive bowel sounds  Musculoskeletal: no cyanosis of digits and no clubbing  PSYCH: alert & oriented x 3 with fluent speech NEURO: no focal motor/sensory deficits  LABORATORY DATA:  I have reviewed the data as listed  . CBC Latest Ref Rng & Units 04/04/2016 01/22/2016 01/22/2016  WBC 3.9 - 10.3 10e3/uL 8.1 - 9.1  Hemoglobin 11.6 - 15.9 g/dL 13.4 14.3 14.1  Hematocrit 34.8 - 46.6 % 40.0 42.0 41.9  Platelets 145 - 400 10e3/uL 254 - 250    . CMP Latest Ref Rng & Units 01/22/2016 01/22/2016 12/19/2015  Glucose 65 - 99 mg/dL 101(H) 105(H) 110  BUN 6 - 20 mg/dL 18 15 16.9  Creatinine 0.44 - 1.00 mg/dL 0.70 0.75 0.8  Sodium 135 - 145 mmol/L 137 138 137  Potassium 3.5 - 5.1 mmol/L 3.8 3.8 4.1  Chloride 101 - 111 mmol/L 104 106 -  CO2 22 - 32 mmol/L - 25 25  Calcium 8.9 - 10.3 mg/dL - 8.6(L) 8.5  Total Protein 6.5 - 8.1 g/dL - 5.5(L) 5.6(L)  Total Bilirubin 0.3 - 1.2 mg/dL - 0.8 0.60  Alkaline Phos 38 - 126 U/L - 41 57  AST 15 - 41 U/L - 20 19  ALT 14 - 54 U/L - 13(L) 12   . Lab Results  Component Value Date   IRON 107 10/05/2015   TIBC 269 10/05/2015   IRONPCTSAT 40 10/05/2015   (Iron and TIBC)  Lab Results  Component Value Date   FERRITIN 23 04/04/2016    B12 - 503 RADIOGRAPHIC STUDIES: I have personally reviewed the radiological images as listed and agreed with the findings in the report. No results found.  ASSESSMENT & PLAN:   83 yo caucasian female with   1) CHronic iron deficiency anemia due to chronic GI blood loss and likely issues with absorption related to partial gastrectomy. Also  patient has had intolerance to po iron. Ferritin levels are gradually downtrending  99--> 55--> 23. 2) h/o B12 deficiency - level stable currently with B complex replacement. Plan -patient ferritin is down to 23 despite po iron replacement -will offer her IV INjectafer x 1 dose to keep her iron stores to avoid wrorsening anemia. -will continue to monitor cbc, cmp and iron labs q 20months and replace iron IV to maintain ferritin >100 -continue B12 and B complex -rpt  labs in 3 months and clinic visit in 6 months.   RTC with Dr Irene Limbo in 6 months with cbc, cmp and ferritin.\  All of the patients questions were answered with apparent satisfaction. The patient knows to call the clinic with any problems, questions or concerns.  I spent 20 minutes counseling the patient face to face. The total time spent in the appointment was 20 minutes and more than 50% was on counseling and direct patient cares.    Sullivan Lone MD [place] AAHIVMS [facility] Hematology/Oncology Physician [facility]  (Office):       [phone] (Work cell):  [phone] (Fax):           [phone]

## 2016-04-07 ENCOUNTER — Telehealth: Payer: Self-pay | Admitting: *Deleted

## 2016-04-07 ENCOUNTER — Encounter: Payer: Self-pay | Admitting: *Deleted

## 2016-04-07 NOTE — Telephone Encounter (Signed)
Pt called back to discuss lab results.  Informed pt that ferritin is down to 23 and Dr. Irene Limbo would like pt to receive 1 dose of IV injectafer.  Pt verbalized understanding.  Scheduling message sent.

## 2016-04-07 NOTE — Telephone Encounter (Signed)
LVM with patient per Dr. Irene Limbo to discuss results of ferritin and need for IV injectafer, call back number provided.

## 2016-04-08 ENCOUNTER — Telehealth: Payer: Self-pay | Admitting: Hematology

## 2016-04-08 NOTE — Telephone Encounter (Signed)
Confirmed 3/9 appt at 0930 with pt per [facility]

## 2016-04-11 ENCOUNTER — Ambulatory Visit (HOSPITAL_BASED_OUTPATIENT_CLINIC_OR_DEPARTMENT_OTHER): Payer: PPO

## 2016-04-11 VITALS — BP 151/61 | HR 72 | Temp 97.5°F | Resp 17

## 2016-04-11 DIAGNOSIS — D500 Iron deficiency anemia secondary to blood loss (chronic): Secondary | ICD-10-CM | POA: Diagnosis not present

## 2016-04-11 MED ORDER — SODIUM CHLORIDE 0.9 % IV SOLN
750.0000 mg | Freq: Once | INTRAVENOUS | Status: AC
  Administered 2016-04-11: 750 mg via INTRAVENOUS
  Filled 2016-04-11: qty 15

## 2016-04-11 NOTE — Patient Instructions (Signed)
Ferric carboxymaltose injection What is this medicine? FERRIC CARBOXYMALTOSE (ferr-ik car-box-ee-mol-toes) is an iron complex. Iron is used to make healthy red blood cells, which carry oxygen and nutrients throughout the body. This medicine is used to treat anemia in people with chronic kidney disease or people who cannot take iron by mouth. This medicine may be used for other purposes; ask your health care provider or pharmacist if you have questions. COMMON BRAND NAME(S): Injectafer What should I tell my health care provider before I take this medicine? They need to know if you have any of these conditions: -anemia not caused by low iron levels -high levels of iron in the blood -liver disease -an unusual or allergic reaction to iron, other medicines, foods, dyes, or preservatives -pregnant or trying to get pregnant -breast-feeding How should I use this medicine? This medicine is for infusion into a vein. It is given by a health care professional in a hospital or clinic setting. Talk to your pediatrician regarding the use of this medicine in children. Special care may be needed. Overdosage: If you think you have taken too much of this medicine contact a poison control center or emergency room at once. NOTE: This medicine is only for you. Do not share this medicine with others. What if I miss a dose? It is important not to miss your dose. Call your doctor or health care professional if you are unable to keep an appointment. What may interact with this medicine? Do not take this medicine with any of the following medications: -deferoxamine -dimercaprol -other iron products This medicine may also interact with the following medications: -chloramphenicol -deferasirox This list may not describe all possible interactions. Give your health care provider a list of all the medicines, herbs, non-prescription drugs, or dietary supplements you use. Also tell them if you smoke, drink alcohol, or use  illegal drugs. Some items may interact with your medicine. What should I watch for while using this medicine? Visit your doctor or health care professional regularly. Tell your doctor if your symptoms do not start to get better or if they get worse. You may need blood work done while you are taking this medicine. You may need to follow a special diet. Talk to your doctor. Foods that contain iron include: whole grains/cereals, dried fruits, beans, or peas, leafy green vegetables, and organ meats (liver, kidney). What side effects may I notice from receiving this medicine? Side effects that you should report to your doctor or health care professional as soon as possible: -allergic reactions like skin rash, itching or hives, swelling of the face, lips, or tongue -breathing problems -changes in blood pressure -feeling faint or lightheaded, falls -flushing, sweating, or hot feelings Side effects that usually do not require medical attention (report to your doctor or health care professional if they continue or are bothersome): -changes in taste -constipation -dizziness -headache -nausea -pain, redness, or irritation at site where injected -vomiting This list may not describe all possible side effects. Call your doctor for medical advice about side effects. You may report side effects to FDA at 1-800-FDA-1088. Where should I keep my medicine? This drug is given in a hospital or clinic and will not be stored at home. NOTE: This sheet is a summary. It may not cover all possible information. If you have questions about this medicine, talk to your doctor, pharmacist, or health care provider.  2018 Elsevier/Gold Standard (2015-02-22 11:20:47)  

## 2016-05-06 ENCOUNTER — Encounter: Payer: Self-pay | Admitting: Physician Assistant

## 2016-05-15 ENCOUNTER — Ambulatory Visit (INDEPENDENT_AMBULATORY_CARE_PROVIDER_SITE_OTHER): Payer: PPO | Admitting: Physician Assistant

## 2016-05-15 ENCOUNTER — Encounter: Payer: Self-pay | Admitting: Physician Assistant

## 2016-05-15 ENCOUNTER — Encounter (INDEPENDENT_AMBULATORY_CARE_PROVIDER_SITE_OTHER): Payer: Self-pay

## 2016-05-15 VITALS — Ht 62.0 in | Wt 103.0 lb

## 2016-05-15 DIAGNOSIS — K222 Esophageal obstruction: Secondary | ICD-10-CM

## 2016-05-15 DIAGNOSIS — R1310 Dysphagia, unspecified: Secondary | ICD-10-CM | POA: Diagnosis not present

## 2016-05-15 NOTE — Patient Instructions (Signed)
You have been scheduled for an endoscopy. Please follow written instructions given to you at your visit today. If you use inhalers (even only as needed), please bring them with you on the day of your procedure. Your physician has requested that you go to www.startemmi.com and enter the access code given to you at your visit today. This web site gives a general overview about your procedure. However, you should still follow specific instructions given to you by our office regarding your preparation for the procedure.  Soft diet, cut food into very small pieces. Take pills with applesauce.   Soft-Food Meal Plan A soft-food meal plan includes foods that are safe and easy to swallow. This meal plan typically is used:  If you are having trouble chewing or swallowing foods.  As a transition meal plan after only having had liquid meals for a long period. What do I need to know about the soft-food meal plan? A soft-food meal plan includes tender foods that are soft and easy to chew and swallow. In most cases, bite-sized pieces of food are easier to swallow. A bite-sized piece is about  inch or smaller. Foods in this plan do not need to be ground or pureed. Foods that are very hard, crunchy, or sticky should be avoided. Also, breads, cereals, yogurts, and desserts with nuts, seeds, or fruits should be avoided. What foods can I eat? Grains  Rice and wild rice. Moist bread, dressing, pasta, and noodles. Well-moistened dry or cooked cereals, such as farina (cooked wheat cereal), oatmeal, or grits. Biscuits, breads, muffins, pancakes, and waffles that have been well moistened. Vegetables  Shredded lettuce. Cooked, tender vegetables, including potatoes without skins. Vegetable juices. Broths or creamed soups made with vegetables that are not stringy or chewy. Strained tomatoes (without seeds). Fruits  Canned or well-cooked fruits. Soft (ripe), peeled fresh fruits, such as peaches, nectarines, kiwi,  cantaloupe, honeydew melon, and watermelon (without seeds). Soft berries with small seeds, such as strawberries. Fruit juices (without pulp). Meats and Other Protein Sources  Moist, tender, lean beef. Mutton. Lamb. Veal. Chicken. [place]. Liver. Ham. Fish without bones. Eggs. Dairy  Milk, milk drinks, and cream. Plain cream cheese and cottage cheese. Plain yogurt. Sweets/Desserts  Flavored gelatin desserts. Custard. Plain ice cream, frozen yogurt, sherbet, milk shakes, and malts. Plain cakes and cookies. Plain hard candy. Other  Butter, margarine (without trans fat), and cooking oils. Mayonnaise. Cream sauces. Mild spices, salt, and sugar. Syrup, molasses, honey, and jelly. The items listed above may not be a complete list of recommended foods or beverages. Contact your dietitian for more options.  What foods are not recommended? Grains  Dry bread, toast, crackers that have not been moistened. Coarse or dry cereals, such as bran, granola, and shredded wheat. Tough or chewy crusty breads, such as [place] bread or baguettes. Vegetables  Corn. Raw vegetables except shredded lettuce. Cooked vegetables that are tough or stringy. Tough, crisp, fried potatoes and potato skins. Fruits  Fresh fruits with skins or seeds or both, such as apples, pears, or grapes. Stringy, high-pulp fruits, such as papaya, pineapple, coconut, or mango. Fruit leather, fruit roll-ups, and all dried fruits. Meats and Other Protein Sources  Sausages and hot dogs. Meats with gristle. Fish with bones. Nuts, seeds, and chunky peanut or other nut butters. Sweets/Desserts  Cakes or cookies that are very dry or chewy. The items listed above may not be a complete list of foods and beverages to avoid. Contact your dietitian for more information.  This  information is not intended to replace advice given to you by your health care provider. Make sure you discuss any questions you have with your health care provider. Document Released:  04/29/2007 Document Revised: 06/28/2015 Document Reviewed: 12/17/2012 Elsevier Interactive Patient Education  2017 [employer].

## 2016-05-15 NOTE — Progress Notes (Addendum)
Subjective:    Patient ID: [name] [name], female    DOB: 01-Sep-1931, 83 y.o.   MRN: [id number]  HPI [name] [name] is a very nice 83 year old white female, known previously to Dr. Sharlett Iles who was last seen on our office in 2011. She is referred by Dr. Janeice Robinson today for evaluation of dysphagia. She last had EGD in 2008 with finding of a distal esophageal stricture with the lumen under 14 mm and she was dilated with the scope. Prior to that she had had at least 2 other esophageal dilations. Last colonoscopy done in 2002 negative with the exception of diverticulosis. Patient has history of remote partial gastrectomy and vagotomy in the 1970s for refractory bleeding ulcer. She is also status post appendectomy cholecystectomy. She has history of iron deficiency and B12 deficiency, both likely on the basis of malabsorption post partial gastrectomy and is followed by hematology, on serial IV iron infusion replacement. Patient says she had not had any difficulty with dysphagia for several years but her husband feels that she said recurrence of symptoms over the past couple of months. Remained on omeprazole 20 mg by mouth every morning long term. She says she's having difficulty with pills and solid food. Her husband feels she's had at least 3 episodes over the past 10 days where she has had pills that she has had to try to regurgitate back up or food. She says at times food will feel like it sitting in her chest for a long time, she has to stop eating and wait for the past, over the past couple weeks she's had a couple episodes of regurgitation as well. She denies any heartburn or indigestion. No abdominal pain. She has had long-term intermittent diarrhea which she controls with Imodium.  No aspirin or blood thinner use.  Review of Systems Pertinent positive and negative review of systems were noted in the above HPI section.  All other review of systems was otherwise negative.  Outpatient Encounter  Prescriptions as of 05/15/2016  Medication Sig  . acetaminophen (TYLENOL) 325 MG tablet Take 650 mg by mouth every 6 (six) hours as needed. For aches/pains  . Ascorbic Acid (VITAMIN C) 500 MG CAPS Take 500 mg by mouth 4 (four) times daily.    [name] b complex vitamins capsule Take 1 capsule by mouth daily.  . Calcium Carbonate-Vitamin D (CALCIUM 500 + D PO) Take by mouth. 3 tablets daily  . diphenhydrAMINE (BENADRYL) 25 mg capsule Take 25 mg by mouth at bedtime as needed. For allergies  . estradiol (ESTRACE) 1 MG tablet Take 1 mg by mouth daily.  [name] levothyroxine (SYNTHROID, LEVOTHROID) 112 MCG tablet Take 112 mcg by mouth daily before breakfast.  . loperamide (IMODIUM) 2 MG capsule Take 4 mg by mouth 4 (four) times daily as needed for diarrhea or loose stools.   [name] losartan (COZAAR) 25 MG tablet Take 12.5 mg by mouth daily. Takes 1/2 tablet daily   . omeprazole (PRILOSEC) 20 MG capsule Take 20 mg by mouth 2 (two) times daily.    No facility-administered encounter medications on file as of 05/15/2016.    Allergies  Allergen Reactions  . Fergon [Ferrous Gluconate] Nausea And Vomiting and Other (See Comments)    Back pain  . Ampicillin Other (See Comments)    Jumpy legs  . Cephalexin     Jumpy legs  . Codeine Nausea Only  . Famotidine Other (See Comments)    jittery  . Nizatidine Other (See Comments)  jittery  . Tagamet [Cimetidine] Other (See Comments)    jittery  . Salmon [Fish Allergy] Itching and Rash   Patient Active Problem List   Diagnosis Date Noted  . Gait disorder 04/13/2014  . Eosinophilia 07/15/2013  . Iron deficiency anemia 08/10/2010  . B12 deficiency 08/10/2010   Social History   Social History  . Marital status: Married    Spouse name: Ariyon Gerstenberger  . Number of children: 2  . Years of education: 12   Occupational History  . retired    Social History Main Topics  . Smoking status: Never Smoker  . Smokeless tobacco: Never Used  . Alcohol use 4.2 oz/week     7 Glasses of wine per week  . Drug use: No  . Sexual activity: Not on file   Other Topics Concern  . Not on file   Social History Narrative   Patient is right handed.   Patient drinks 1 cup of caffeine per day.    [name] family history includes Breast cancer in her sister; Colon cancer in her sister; Heart disease in her father; Hypertension in her brother.      Objective:    There were no vitals filed for this visit.  Physical Exam developed small elderly white female in no acute distress, accompanied by her husband both very pleasant, Height 5 foot 2, weight 103, BMI 18.84. HEENT; nontraumatic normocephalic EOMI PERRLA sclera anicteric, Cardiovascular; regular rate and rhythm with S1-S2 no murmur or gallop, Pulmonary clear bilaterally, Abdomen; soft, nontender nondistended bowel sounds are active no palpable mass or hepatosplenomegaly, she does have midline incisional scar, Rectal; exam not done, Extremities; no clubbing cyanosis or edema skin warm and dry, Neuropsych; mood and affect appropriate, she does allow her husband to do most of the talking.       Assessment & Plan:   #21 83 year old white female with history of distal esophageal stricture requiring previous dilations with recurrent solid food and pill dysphagia over the past month. Symptoms very consistent with recurrent distal esophageal stricture #2 status post remote partial gastrectomy and vagotomy in the 1970s for refractory ulcer disease #3 status post cholecystectomy/appendectomy #4 history of chronic iron and B12 deficiency-likely on the basis of malabsorption post gastrectomy and vagotomy-followed by hematology #5 diverticulosis  Plan; Patient will be established with Dr. Carlean Purl. Will schedule for EGD with dilation with Dr. Carlean Purl in the [facility]. Her husband mentioned that a pediatric scope had been used at one point in the past, though not mentioned in her last endoscopy report. Procedure discussed in  detail with patient and her husband including risks and benefits and they're agreeable to proceed. In the interim she is advised to take her pills with apple sauce  And chop  all of her foods.  Tryson Lumley Genia Harold PA-C 05/15/2016   Cc: [name], MD  Agree with Ms. Genia Harold assessment and plan. Gatha Mayer, MD, [facility]

## 2016-06-17 ENCOUNTER — Ambulatory Visit (AMBULATORY_SURGERY_CENTER): Payer: PPO | Admitting: Internal Medicine

## 2016-06-17 ENCOUNTER — Encounter: Payer: Self-pay | Admitting: Internal Medicine

## 2016-06-17 VITALS — BP 172/98 | HR 71 | Temp 97.5°F | Resp 18 | Ht 62.0 in | Wt 103.0 lb

## 2016-06-17 DIAGNOSIS — K2960 Other gastritis without bleeding: Secondary | ICD-10-CM | POA: Diagnosis not present

## 2016-06-17 DIAGNOSIS — R1310 Dysphagia, unspecified: Secondary | ICD-10-CM | POA: Diagnosis not present

## 2016-06-17 DIAGNOSIS — K222 Esophageal obstruction: Secondary | ICD-10-CM | POA: Diagnosis not present

## 2016-06-17 MED ORDER — OMEPRAZOLE 40 MG PO CPDR: 40.0000 mg | DELAYED_RELEASE_CAPSULE | Freq: Every day | ORAL | 3 refills | Status: DC

## 2016-06-17 MED ORDER — SODIUM CHLORIDE 0.9 % IV SOLN: 500.0000 mL | INTRAVENOUS | Status: DC

## 2016-06-17 NOTE — Progress Notes (Signed)
Called to room to assist during endoscopic procedure.  Patient ID and intended procedure confirmed with present staff. Received instructions for my participation in the procedure from the performing physician.  

## 2016-06-17 NOTE — Op Note (Addendum)
Mobile Patient Name: [name] [name] Procedure Date: 06/17/2016 2:20 PM MRN: [id number] Endoscopist: Gatha Mayer , MD Age: 83 Referring MD:  Date of Birth: 05-29-1931 Gender: Female Account #: [id number] Procedure:                Upper GI endoscopy Indications:              Dysphagia, For therapy of esophageal stricture Medicines:                Propofol per Anesthesia, Monitored Anesthesia Care Procedure:                Pre-Anesthesia Assessment:                           - Prior to the procedure, a History and Physical                            was performed, and patient medications and                            allergies were reviewed. The patient's tolerance of                            previous anesthesia was also reviewed. The risks                            and benefits of the procedure and the sedation                            options and risks were discussed with the patient.                            All questions were answered, and informed consent                            was obtained. Prior Anticoagulants: The patient has                            taken no previous anticoagulant or antiplatelet                            agents. ASA Grade Assessment: III - A patient with                            severe systemic disease. After reviewing the risks                            and benefits, the patient was deemed in                            satisfactory condition to undergo the procedure.                           After obtaining informed consent, the endoscope was  passed under direct vision. Throughout the                            procedure, the patient's blood pressure, pulse, and                            oxygen saturations were monitored continuously. The                            Model GIF-HQ190 [id number]) scope was introduced                            through the mouth, and advanced to the second part                        of duodenum. The upper GI endoscopy was                            accomplished without difficulty. The patient                            tolerated the procedure well. Scope In: Scope Out: Findings:                 One moderate benign-appearing, intrinsic stenosis                            was found at the gastroesophageal junction. This                            measured 1.4 cm (inner diameter) and was traversed.                            A TTS dilator was passed through the scope.                            Dilation with a 16-17-18 mm balloon dilator was                            performed to 18 mm. The dilation site was examined                            and showed complete resolution of luminal                            narrowing. Estimated blood loss was minimal.                           Diffuse severe inflammation characterized by                            erosions, erythema, friability and mucus was found                            in the entire examined stomach.  A deformity was found at the pylorus.                           The examined duodenum was normal.                           The cardia and gastric fundus were otherwise normal                            on retroflexion. Complications:            No immediate complications. Estimated Blood Loss:     Estimated blood loss was minimal. Impression:               - Benign-appearing esophageal stenosis. Dilated.                           - Chronic bile gastritis.                           - Post-surgical deformity in the pylorus.                           - Normal examined duodenum.                           - No specimens collected. Recommendation:           - Patient has a contact number available for                            emergencies. The signs and symptoms of potential                            delayed complications were discussed with the                             patient. Return to normal activities tomorrow.                            Written discharge instructions were provided to the                            patient.                           - Continue present medications.                           - Clear liquids x 1 hour then soft foods rest of                            day. Start prior diet tomorrow.                           - Repeat upper endoscopy PRN for retreatment.                           -  Use Prilosec (omeprazole) 40 mg PO daily                            indefinitely. Increase from 20 mg Gatha Mayer, MD 06/17/2016 2:53:08 PM This report has been signed electronically.

## 2016-06-17 NOTE — Patient Instructions (Signed)
Impression/Recommendations:  Go to [employer] to pick up new dosage of Omeprazole, 40 mg. Daily by mouth.  Dr. Celesta Aver office will call to schedule an appointment re: diarrhea.  Continue present medications.  Repeat upper endoscopy as needed for retreatment.  YOU HAD AN ENDOSCOPIC PROCEDURE TODAY AT [facility]:   Refer to the procedure report that was given to you for any specific questions about what was found during the examination.  If the procedure report does not answer your questions, please call your gastroenterologist to clarify.  If you requested that your care partner not be given the details of your procedure findings, then the procedure report has been included in a sealed envelope for you to review at your convenience later.  YOU SHOULD EXPECT: Some feelings of bloating in the abdomen. Passage of more gas than usual.  Walking can help get rid of the air that was put into your GI tract during the procedure and reduce the bloating. If you had a lower endoscopy (such as a colonoscopy or flexible sigmoidoscopy) you may notice spotting of blood in your stool or on the toilet paper. If you underwent a bowel prep for your procedure, you may not have a normal bowel movement for a few days.  Please Note:  You might notice some irritation and congestion in your nose or some drainage.  This is from the oxygen used during your procedure.  There is no need for concern and it should clear up in a day or so.  SYMPTOMS TO REPORT IMMEDIATELY:  Following upper endoscopy (EGD)  Vomiting of blood or coffee ground material  New chest pain or pain under the shoulder blades  Painful or persistently difficult swallowing  New shortness of breath  Fever of 100F or higher  Black, tarry-looking stools  For urgent or emergent issues, a gastroenterologist can be reached at any hour by calling [phone].   DIET:  We do recommend a small meal at first, but then you may proceed to your  regular diet.  Drink plenty of fluids but you should avoid alcoholic beverages for 24 hours.  ACTIVITY:  You should plan to take it easy for the rest of today and you should NOT DRIVE or use heavy machinery until tomorrow (because of the sedation medicines used during the test).    FOLLOW UP: Our staff will call the number listed on your records the next business day following your procedure to check on you and address any questions or concerns that you may have regarding the information given to you following your procedure. If we do not reach you, we will leave a message.  However, if you are feeling well and you are not experiencing any problems, there is no need to return our call.  We will assume that you have returned to your regular daily activities without incident.  If any biopsies were taken you will be contacted by phone or by letter within the next 1-3 weeks.  Please call us at [phone] if you have not heard about the biopsies in 3 weeks.    SIGNATURES/CONFIDENTIALITY: You and/or your care partner have signed paperwork which will be entered into your electronic medical record.  These signatures attest to the fact that that the information above on your After Visit Summary has been reviewed and is understood.  Full responsibility of the confidentiality of this discharge information lies with you and/or your care-partner.

## 2016-06-17 NOTE — Care Management (Signed)
To PACU VSS Report to RN 

## 2016-06-18 ENCOUNTER — Telehealth: Payer: Self-pay

## 2016-06-18 NOTE — Telephone Encounter (Signed)
  Follow up Call-  Call back number 06/17/2016 06/17/2016  Post procedure Call Back phone  # [phone]  Some recent data might be hidden     Patient questions:  Do you have a fever, pain , or abdominal swelling? No. Pain Score  0 *  Have you tolerated food without any problems? Yes.    Have you been able to return to your normal activities? Yes.    Do you have any questions about your discharge instructions: Diet   No. Medications  No. Follow up visit  No.  Do you have questions or concerns about your Care? No.  Actions: * If pain score is 4 or above: No action needed, pain <4.  I spoke with pt's husband this am.  He reported pt was "just fine". maw

## 2016-06-18 NOTE — Telephone Encounter (Signed)
Left message for patient to call me

## 2016-06-18 NOTE — Telephone Encounter (Signed)
Patient added to the May 21st schedule at 3:30pm.  She was very [employer] attorney.

## 2016-06-18 NOTE — Telephone Encounter (Signed)
-----   Message from Gatha Mayer, MD sent at 06/17/2016  3:06 PM EDT ----- Regarding: needs appt Please call later in week (not toady) and get her an appt re: urgent diarrhea - can see her later this month or June - not emergency but sooner han next avail plz

## 2016-06-23 ENCOUNTER — Encounter: Payer: Self-pay | Admitting: Internal Medicine

## 2016-06-23 ENCOUNTER — Ambulatory Visit (INDEPENDENT_AMBULATORY_CARE_PROVIDER_SITE_OTHER): Payer: PPO | Admitting: Internal Medicine

## 2016-06-23 VITALS — BP 132/66 | HR 76 | Ht 62.0 in | Wt 104.8 lb

## 2016-06-23 DIAGNOSIS — R197 Diarrhea, unspecified: Secondary | ICD-10-CM

## 2016-06-23 DIAGNOSIS — K222 Esophageal obstruction: Secondary | ICD-10-CM

## 2016-06-23 MED ORDER — CHOLESTYRAMINE LIGHT 4 GM/DOSE PO POWD: 4.0000 g | Freq: Every day | ORAL | 5 refills | Status: DC

## 2016-06-23 NOTE — Patient Instructions (Addendum)
We have sent the following medications to your pharmacy for you to pick up at your convenience: Cholestyramine   Follow up with Dr Carlean Purl in 2 months but if your better you may cancel.   I appreciate the opportunity to care for you. Silvano Rusk, MD, [facility]

## 2016-06-23 NOTE — Progress Notes (Signed)
   [name] [name] 83 y.o. 1931/06/17 [id number]  Assessment & Plan:   Encounter Diagnoses  Name Primary?  . Diarrhea, unspecified type Yes  . Esophageal stricture      File of cholestyramine light. Continue PPI. Return in 2 months. She may cancel if asymptomatic.  Subjective:   Chief Complaint:  HPI The patient is doing well without dysphagia since I dilated her esophagus the other week. However when they're she describes chronic intermittent diarrhea episodes 2 or 3 times a month with voluminous watery diarrhea without warning and some fecal incontinence. This is been present for a number of years. Looking back through the chart she's had duodenal biopsies more than once negative for celiac disease.  Medications, allergies, past medical history, past surgical history, family history and social history are reviewed and updated in the EMR.  Review of Systems As above  Objective:   Physical Exam BP 132/66   Pulse 76   Ht 5\' 2"  (1.575 m)   Wt 104 lb 12.8 oz (47.5 kg)   BMI 19.17 kg/m  Abdomen soft nontender Eyes are anicteric Appropriate mood and affect Alert and oriented 3

## 2016-07-11 ENCOUNTER — Other Ambulatory Visit: Payer: PPO

## 2016-07-31 ENCOUNTER — Other Ambulatory Visit (HOSPITAL_BASED_OUTPATIENT_CLINIC_OR_DEPARTMENT_OTHER): Payer: PPO

## 2016-07-31 DIAGNOSIS — E538 Deficiency of other specified B group vitamins: Secondary | ICD-10-CM

## 2016-07-31 DIAGNOSIS — D509 Iron deficiency anemia, unspecified: Secondary | ICD-10-CM

## 2016-07-31 DIAGNOSIS — D500 Iron deficiency anemia secondary to blood loss (chronic): Secondary | ICD-10-CM | POA: Diagnosis not present

## 2016-07-31 LAB — CBC & DIFF AND RETIC
BASO%: 1.1 % (ref 0.0–2.0)
Basophils Absolute: 0.1 10*3/uL (ref 0.0–0.1)
EOS%: 14.9 % — ABNORMAL HIGH (ref 0.0–7.0)
Eosinophils Absolute: 1.1 10*3/uL — ABNORMAL HIGH (ref 0.0–0.5)
HCT: 39.9 % (ref 34.8–46.6)
HGB: 13.4 g/dL (ref 11.6–15.9)
Immature Retic Fract: 3.1 % (ref 1.60–10.00)
LYMPH#: 1.8 10*3/uL (ref 0.9–3.3)
LYMPH%: 23.1 % (ref 14.0–49.7)
MCH: 33.5 pg (ref 25.1–34.0)
MCHC: 33.6 g/dL (ref 31.5–36.0)
MCV: 99.8 fL (ref 79.5–101.0)
MONO#: 0.5 10*3/uL (ref 0.1–0.9)
MONO%: 7 % (ref 0.0–14.0)
NEUT%: 53.9 % (ref 38.4–76.8)
NEUTROS ABS: 4.1 10*3/uL (ref 1.5–6.5)
Platelets: 262 10*3/uL (ref 145–400)
RBC: 4 10*6/uL (ref 3.70–5.45)
RDW: 12.8 % (ref 11.2–14.5)
RETIC %: 1.54 % (ref 0.70–2.10)
RETIC CT ABS: 61.6 10*3/uL (ref 33.70–90.70)
WBC: 7.6 10*3/uL (ref 3.9–10.3)

## 2016-07-31 LAB — COMPREHENSIVE METABOLIC PANEL
ALT: 17 U/L (ref 0–55)
AST: 24 U/L (ref 5–34)
Albumin: 3.3 g/dL — ABNORMAL LOW (ref 3.5–5.0)
Alkaline Phosphatase: 63 U/L (ref 40–150)
Anion Gap: 8 mEq/L (ref 3–11)
BUN: 13.4 mg/dL (ref 7.0–26.0)
CHLORIDE: 108 meq/L (ref 98–109)
CO2: 26 meq/L (ref 22–29)
CREATININE: 0.7 mg/dL (ref 0.6–1.1)
Calcium: 9.1 mg/dL (ref 8.4–10.4)
EGFR: 76 mL/min/{1.73_m2} — ABNORMAL LOW (ref >90)
Glucose: 87 mg/dl (ref 70–140)
POTASSIUM: 4 meq/L (ref 3.5–5.1)
Sodium: 142 mEq/L (ref 136–145)
Total Bilirubin: 0.66 mg/dL (ref 0.20–1.20)
Total Protein: 6 g/dL — ABNORMAL LOW (ref 6.4–8.3)

## 2016-08-01 LAB — IRON AND TIBC
%SAT: 28 % (ref 21–57)
Iron: 80 ug/dL (ref 41–142)
TIBC: 285 ug/dL (ref 236–444)
UIBC: 205 ug/dL (ref 120–384)

## 2016-08-01 LAB — FERRITIN: Ferritin: 104 ng/ml (ref 9–269)

## 2016-08-25 DIAGNOSIS — H5213 Myopia, bilateral: Secondary | ICD-10-CM | POA: Diagnosis not present

## 2016-08-25 DIAGNOSIS — H04123 Dry eye syndrome of bilateral lacrimal glands: Secondary | ICD-10-CM | POA: Diagnosis not present

## 2016-08-25 DIAGNOSIS — Z961 Presence of intraocular lens: Secondary | ICD-10-CM | POA: Diagnosis not present

## 2016-09-01 ENCOUNTER — Ambulatory Visit: Payer: PPO | Admitting: Internal Medicine

## 2016-09-05 ENCOUNTER — Ambulatory Visit (INDEPENDENT_AMBULATORY_CARE_PROVIDER_SITE_OTHER): Payer: PPO | Admitting: Internal Medicine

## 2016-09-05 ENCOUNTER — Encounter: Payer: Self-pay | Admitting: Internal Medicine

## 2016-09-05 VITALS — BP 104/76 | HR 72 | Ht 62.0 in | Wt 106.0 lb

## 2016-09-05 DIAGNOSIS — N3946 Mixed incontinence: Secondary | ICD-10-CM | POA: Diagnosis not present

## 2016-09-05 DIAGNOSIS — K222 Esophageal obstruction: Secondary | ICD-10-CM | POA: Diagnosis not present

## 2016-09-05 DIAGNOSIS — K580 Irritable bowel syndrome with diarrhea: Secondary | ICD-10-CM | POA: Diagnosis not present

## 2016-09-05 DIAGNOSIS — M21961 Unspecified acquired deformity of right lower leg: Secondary | ICD-10-CM

## 2016-09-05 HISTORY — DX: Esophageal obstruction: K22.2

## 2016-09-05 NOTE — Patient Instructions (Addendum)
   Continue your Imodium.   Follow up with your orthopedic Doctor about your knee.   We are setting you up with [facility] for an appointment for your urinary incontinence.  They are located at [address] phone # [phone].  If you haven't heard back from them by the end of next week call me back.    Follow up with Dr Carlean Purl as needed.    I appreciate the opportunity to care for you. Silvano Rusk, MD, [facility]

## 2016-09-05 NOTE — Progress Notes (Signed)
[name] [name] 82 y.o. 1931-08-11 [id number]  Assessment & Plan:   Encounter Diagnoses  Name Primary?  . Irritable bowel syndrome with diarrhea Yes  . Mixed stress and urge urinary incontinence   . Acquired deformity of right knee   . Esophageal stricture    Loperamide qhs and extra prn GU referral re: urtinary incontinence Ortho re: knee "turning in" GI prn and stay on PPI to reduce risk of recurrent stricture and dysphagia  I appreciate the opportunity to care for this patient.  TO:IZTIWPY, Jori Moll, MD    Subjective:   Chief Complaint: follow up of diarrhea  HPI The patient is here with her husband and reports that by taking one loperamide at bedtime or diarrhea problems are significantly improved and her quality of life from that perspective is good. She does complain of stress and urge and perhaps other urinary incontinence that is worsening and is "a problem". She is interested in help with this. Her husband asks what they should do because he has noticed that her knee is turning in. This is on the right. Apparently they have seen Dr. French Ana or his associate in the past and found that one leg the right when is shorter than the other and I have suggested a follow-up there since they had had help with problems then and it could be related.  Dysphagia is resolved. Allergies  Allergen Reactions  . Fergon [Ferrous Gluconate] Nausea And Vomiting and Other (See Comments)    Back pain  . Ampicillin Other (See Comments)    Jumpy legs  . Cephalexin     Jumpy legs  . Codeine Nausea Only  . Famotidine Other (See Comments)    jittery  . Nizatidine Other (See Comments)    jittery  . Tagamet [Cimetidine] Other (See Comments)    jittery  . Salmon [Fish Allergy] Itching and Rash   Current Meds  Medication Sig  . acetaminophen (TYLENOL) 325 MG tablet Take 650 mg by mouth every 6 (six) hours as needed. For aches/pains  . Ascorbic Acid (VITAMIN C) 500 MG CAPS Take 500 mg  by mouth 4 (four) times daily.    [name] b complex vitamins capsule Take 1 capsule by mouth daily.  . Calcium Carbonate-Vitamin D (CALCIUM 500 + D PO) Take by mouth. 3 tablets daily  . diphenhydrAMINE (BENADRYL) 25 mg capsule Take 25 mg by mouth at bedtime as needed. For allergies  . estradiol (ESTRACE) 1 MG tablet Take 1 mg by mouth daily.  [name] levothyroxine (SYNTHROID, LEVOTHROID) 112 MCG tablet Take 112 mcg by mouth daily before breakfast.  . loperamide (IMODIUM) 2 MG capsule Take 4 mg by mouth 4 (four) times daily as needed for diarrhea or loose stools.   [name] losartan (COZAAR) 25 MG tablet Take 12.5 mg by mouth daily. Takes 1/2 tablet daily   . omeprazole (PRILOSEC) 40 MG capsule Take 1 capsule (40 mg total) by mouth daily before breakfast.  . [DISCONTINUED] cholestyramine light (PREVALITE) 4 GM/DOSE powder Take 1 packet (4 g total) by mouth daily with supper.   Past Medical History:  Diagnosis Date  . Anemia    iron def  . B12 deficiency 08/10/2010  . Cataract    bilateral  . Esophageal stricture 09/05/2016  . Gait disorder 04/13/2014  . GERD with stricture   . Hypertension   . Iron deficiency anemia 08/10/2010  . Irritable bowel syndrome   . Kidney stones   . Thyroid disease    hypothyroid  Past Surgical History:  Procedure Laterality Date  . ABDOMINAL HYSTERECTOMY  1975  . APPENDECTOMY  1955  . CHOLECYSTECTOMY  1978  . COLONOSCOPY    . ESOPHAGEAL DILATION  multiple  . VAGOTOMY AND PYLOROPLASTY  1971   Review of Systems As above  Objective:   Physical Exam BP 104/76   Pulse 72   Ht 5\' 2"  (1.575 m)   Wt 106 lb (48.1 kg)   BMI 19.39 kg/m  Elderly and slightly frail, NAD  15 minutes time spent with patient > half in counseling coordination of care

## 2016-09-26 ENCOUNTER — Telehealth: Payer: Self-pay

## 2016-09-26 NOTE — Telephone Encounter (Signed)
  Spoke with [facility] and they have called patient but she hasn't called back to set up her appointment.  I reached out to her and encouraged her to call them to set up the appointment for eval for her urinary incontinence.  She said she has been busy trying to move.

## 2016-10-10 ENCOUNTER — Ambulatory Visit: Payer: PPO | Admitting: Hematology

## 2016-10-10 ENCOUNTER — Other Ambulatory Visit: Payer: PPO

## 2016-10-21 ENCOUNTER — Telehealth: Payer: Self-pay | Admitting: Hematology

## 2016-10-21 NOTE — Telephone Encounter (Signed)
Patient's husband called to reschedule appointment for labs and Dr Irene Limbo.  When would Dr Don Perking to see that patient

## 2016-10-24 NOTE — Progress Notes (Signed)
[name]    HEMATOLOGY/ONCOLOGY FOLLOW UP NOTE  Date of Service: 10/27/2016   Patient Care Team: [name], MD as PCP - [place] (Internal Medicine)[place]  CHIEF COMPLAINTS/PURPOSE OF CONSULTATION:  Follow up for iron deficiency anemia  Diagnosis: 1) Iron deficiency anemia due to chronic GI blood loss requiring intermitent IV iron infusions. Last received this on 03/28/2015 and 04/04/2015. Changed to IV Injectafer on 04/11/16 as needed.  2) B12 deficiency on po B complex replacement.   INTERVAL HISTORY  [name] [name] is here for her scheduled followup for iron deficiency anemia.    She presents to the clinic today with her husband.  Her blood counts look good. Her Hg is 13.0 today. Her Iron study is still pending. She reports to doing well and she is doing mostly what she wants to do. She denies any bleeding in her stool or urine.  Her husband reports to wanting to get her off her walker and that she needs more Physical therapy. She denies any new concerns. Her husband notes she needs a urologist because she has a incontence issues with leakage. This leakage occurs often to wears she wears protective underwear and changes it frequently. Her PCP is Dr. Mancel Bale. They tried to reach [facility]. She notes to move having goof bowel movements. She has some swelling in her legs. I report her to stay on a schedule with her urination to help with encontenence    MEDICAL HISTORY:  Past Medical History:  Diagnosis Date  . Anemia    iron def  . B12 deficiency 08/10/2010  . Cataract    bilateral  . Esophageal stricture 09/05/2016  . Gait disorder 04/13/2014  . GERD with stricture   . Hypertension   . Iron deficiency anemia 08/10/2010  . Irritable bowel syndrome   . Kidney stones   . Thyroid disease    hypothyroid  Partial gastrectomy (for bleeding stomach ulcer 11 units)  SURGICAL HISTORY: Past Surgical History:  Procedure Laterality Date  . ABDOMINAL HYSTERECTOMY  1975  . APPENDECTOMY   1955  . CHOLECYSTECTOMY  1978  . COLONOSCOPY    . ESOPHAGEAL DILATION  multiple  . VAGOTOMY AND PYLOROPLASTY  1971    SOCIAL HISTORY: Social History   Social History  . Marital status: Married    Spouse name: [name] [name]  . Number of children: 2  . Years of education: 12   Occupational History  . retired    Social History Main Topics  . Smoking status: Never Smoker  . Smokeless tobacco: Never Used  . Alcohol use 4.2 oz/week    7 Glasses of wine per week  . Drug use: No  . Sexual activity: Not on file   Other Topics Concern  . Not on file   Social History Narrative   Patient is right handed.   Patient drinks 1 cup of caffeine per day.    FAMILY HISTORY: Family History  Problem Relation Age of Onset  . Breast cancer Sister   . Colon cancer Sister        mets from breast  . Hypertension Brother   . Heart disease Father     ALLERGIES:  is allergic to fergon [ferrous gluconate]; ampicillin; cephalexin; codeine; famotidine; nizatidine; tagamet [cimetidine]; and salmon [fish allergy].  MEDICATIONS:  Current Outpatient Prescriptions  Medication Sig Dispense Refill  . acetaminophen (TYLENOL) 325 MG tablet Take 650 mg by mouth every 6 (six) hours as needed. For aches/pains    . Ascorbic Acid (VITAMIN  C) 500 MG CAPS Take 500 mg by mouth 4 (four) times daily.      [name] b complex vitamins capsule Take 1 capsule by mouth daily.    . Calcium Carbonate-Vitamin D (CALCIUM 500 + D PO) Take by mouth. 3 tablets daily    . diphenhydrAMINE (BENADRYL) 25 mg capsule Take 25 mg by mouth at bedtime as needed. For allergies    . estradiol (ESTRACE) 1 MG tablet Take 1 mg by mouth daily.    [name] levothyroxine (SYNTHROID, LEVOTHROID) 112 MCG tablet Take 112 mcg by mouth daily before breakfast.    . loperamide (IMODIUM) 2 MG capsule Take 4 mg by mouth 4 (four) times daily as needed for diarrhea or loose stools.     [name] losartan (COZAAR) 25 MG tablet Take 12.5 mg by mouth daily. Takes 1/2 tablet  daily     . omeprazole (PRILOSEC) 40 MG capsule Take 1 capsule (40 mg total) by mouth daily before breakfast. 90 capsule 3   No current facility-administered medications for this visit.     REVIEW OF SYSTEMS:    10 Point review of Systems was done is negative except as noted above.  PHYSICAL EXAMINATION: ECOG PERFORMANCE STATUS: 2 - Symptomatic, <50% confined to bed  . Vitals:   10/27/16 1503  BP: (!) 178/69  Pulse: 71  Resp: 18  Temp: 98 F (36.7 C)  SpO2: 99%   Filed Weights   10/27/16 1503  Weight: 103 lb 9.6 oz (47 kg)   .Body mass index is 18.95 kg/m.  GENERAL:alert, in no acute distress and comfortable SKIN: skin color, texture, turgor are normal, no rashes or significant lesions EYES: normal, conjunctiva are pink and non-injected, sclera clear OROPHARYNX:no exudate, no erythema and lips, buccal mucosa, and tongue normal  NECK: supple, no JVD, thyroid normal size, non-tender, without nodularity LYMPH:  no palpable lymphadenopathy in the cervical, axillary or inguinal LUNGS: clear to auscultation with normal respiratory effort HEART: regular rate & rhythm,  no murmurs and no lower extremity edema ABDOMEN: abdomen soft, non-tender, normoactive bowel sounds  Musculoskeletal: no cyanosis of digits and no clubbing  PSYCH: alert & oriented x 3 with fluent speech NEURO: no focal motor/sensory deficits  LABORATORY DATA:  I have reviewed the data as listed  . CBC Latest Ref Rng & Units 10/27/2016 07/31/2016 04/04/2016  WBC 3.9 - 10.3 10e3/uL 7.8 7.6 8.1  Hemoglobin 11.6 - 15.9 g/dL 13.0 13.4 13.4  Hematocrit 34.8 - 46.6 % 40.4 39.9 40.0  Platelets 145 - 400 10e3/uL 305 262 254    . CMP Latest Ref Rng & Units 07/31/2016 01/22/2016 01/22/2016  Glucose 70 - 140 mg/dl 87 101(H) 105(H)  BUN 7.0 - 26.0 mg/dL 13.4 18 15   Creatinine 0.6 - 1.1 mg/dL 0.7 0.70 0.75  Sodium 136 - 145 mEq/L 142 137 138  Potassium 3.5 - 5.1 mEq/L 4.0 3.8 3.8  Chloride 101 - 111 mmol/L - 104 106   CO2 22 - 29 mEq/L 26 - 25  Calcium 8.4 - 10.4 mg/dL 9.1 - 8.6(L)  Total Protein 6.4 - 8.3 g/dL 6.0(L) - 5.5(L)  Total Bilirubin 0.20 - 1.20 mg/dL 0.66 - 0.8  Alkaline Phos 40 - 150 U/L 63 - 41  AST 5 - 34 U/L 24 - 20  ALT 0 - 55 U/L 17 - 13(L)   . Lab Results  Component Value Date   IRON 80 07/31/2016   TIBC 285 07/31/2016   IRONPCTSAT 28 07/31/2016   (Iron and TIBC)  Lab Results  Component Value Date   FERRITIN 104 07/31/2016   Component     Latest Ref Rng & Units 10/27/2016  Iron     41 - 142 ug/dL 70  TIBC     236 - 444 ug/dL 310  UIBC     120 - 384 ug/dL 240  %SAT     21 - 57 % 23  Ferritin     9 - 269 ng/ml 41    B12 - 503 RADIOGRAPHIC STUDIES: I have personally reviewed the radiological images as listed and agreed with the findings in the report. No results found.  ASSESSMENT & PLAN:   83 yo caucasian female with   1) CHronic iron deficiency anemia due to chronic GI blood loss and likely issues with absorption related to partial gastrectomy. Also patient has had intolerance to po iron. Ferritin levels are gradually downtrending  99--> 55--> 23__> 102 __>41 2) h/o B12 deficiency - level stable currently with B complex replacement. Plan -patient hgb is stable but her ferritin level has dropped from 102 to 41 since her last IV injectafer -we will plan to give her an additional dose of IV injectafer with a goal to maintain ferritin >100. -continue B12 and B complex -rpt labs in 6 months with clinic visit. -She knows to contact us for her anemia if needed prior to next appointment.   #2 Urinary Incontinence  -She uses protective underwear which she has to change more frequently recently  PLAN: -I will send a referral to a Urologist for her to be seen -I advised her to keep a bathroom schedule to help regulate her urine output.    RTC with Dr Irene Limbo in 6 months with cbc, cmp and ferritin.  -IV injectafer x 1 dose Send Urology referral   All of the  patients questions were answered with apparent satisfaction. The patient knows to call the clinic with any problems, questions or concerns.  I spent 20 minutes counseling the patient face to face. The total time spent in the appointment was 25 minutes and more than 50% was on counseling and direct patient cares.   This document serves as a record of services personally performed by Sullivan Lone, MD. It was created on her behalf by Joslyn Devon, a trained medical scribe. The creation of this record is based on the scribe's personal observations and the provider's statements to them. This document has been checked and approved by the attending provider.    Sullivan Lone MD [place] AAHIVMS [facility] Hematology/Oncology Physician [facility]  (Office):       [phone] (Work cell):  [phone] (Fax):           [phone]

## 2016-10-27 ENCOUNTER — Encounter: Payer: Self-pay | Admitting: Hematology

## 2016-10-27 ENCOUNTER — Telehealth: Payer: Self-pay | Admitting: Hematology

## 2016-10-27 ENCOUNTER — Ambulatory Visit (HOSPITAL_BASED_OUTPATIENT_CLINIC_OR_DEPARTMENT_OTHER): Payer: PPO | Admitting: Hematology

## 2016-10-27 ENCOUNTER — Other Ambulatory Visit (HOSPITAL_BASED_OUTPATIENT_CLINIC_OR_DEPARTMENT_OTHER): Payer: PPO

## 2016-10-27 VITALS — BP 178/69 | HR 71 | Temp 98.0°F | Resp 18 | Ht 62.0 in | Wt 103.6 lb

## 2016-10-27 DIAGNOSIS — D500 Iron deficiency anemia secondary to blood loss (chronic): Secondary | ICD-10-CM

## 2016-10-27 DIAGNOSIS — E538 Deficiency of other specified B group vitamins: Secondary | ICD-10-CM | POA: Diagnosis not present

## 2016-10-27 DIAGNOSIS — R32 Unspecified urinary incontinence: Secondary | ICD-10-CM | POA: Diagnosis not present

## 2016-10-27 LAB — COMPREHENSIVE METABOLIC PANEL
ALBUMIN: 3.3 g/dL — AB (ref 3.5–5.0)
ALT: 24 U/L (ref 0–55)
ANION GAP: 7 meq/L (ref 3–11)
AST: 19 U/L (ref 5–34)
Alkaline Phosphatase: 89 U/L (ref 40–150)
BUN: 20.5 mg/dL (ref 7.0–26.0)
CALCIUM: 9.1 mg/dL (ref 8.4–10.4)
CHLORIDE: 107 meq/L (ref 98–109)
CO2: 27 mEq/L (ref 22–29)
Creatinine: 0.8 mg/dL (ref 0.6–1.1)
EGFR: 67 mL/min/{1.73_m2} — AB (ref >90)
Glucose: 105 mg/dl (ref 70–140)
POTASSIUM: 4 meq/L (ref 3.5–5.1)
Sodium: 140 mEq/L (ref 136–145)
Total Bilirubin: 0.47 mg/dL (ref 0.20–1.20)
Total Protein: 6.6 g/dL (ref 6.4–8.3)

## 2016-10-27 LAB — CBC & DIFF AND RETIC
BASO%: 0.9 % (ref 0.0–2.0)
BASOS ABS: 0.1 10*3/uL (ref 0.0–0.1)
EOS ABS: 1.2 10*3/uL — AB (ref 0.0–0.5)
EOS%: 15.3 % — ABNORMAL HIGH (ref 0.0–7.0)
HEMATOCRIT: 40.4 % (ref 34.8–46.6)
HEMOGLOBIN: 13 g/dL (ref 11.6–15.9)
IMMATURE RETIC FRACT: 5.6 % (ref 1.60–10.00)
LYMPH%: 24.7 % (ref 14.0–49.7)
MCH: 31.6 pg (ref 25.1–34.0)
MCHC: 32.2 g/dL (ref 31.5–36.0)
MCV: 98.1 fL (ref 79.5–101.0)
MONO#: 0.6 10*3/uL (ref 0.1–0.9)
MONO%: 7.1 % (ref 0.0–14.0)
NEUT%: 52 % (ref 38.4–76.8)
NEUTROS ABS: 4.1 10*3/uL (ref 1.5–6.5)
Platelets: 305 10*3/uL (ref 145–400)
RBC: 4.12 10*6/uL (ref 3.70–5.45)
RDW: 12.6 % (ref 11.2–14.5)
Retic %: 1.11 % (ref 0.70–2.10)
Retic Ct Abs: 45.73 10*3/uL (ref 33.70–90.70)
WBC: 7.8 10*3/uL (ref 3.9–10.3)
lymph#: 1.9 10*3/uL (ref 0.9–3.3)

## 2016-10-27 NOTE — Telephone Encounter (Signed)
Gave avs and calendar for march 2019 

## 2016-10-28 LAB — FERRITIN: Ferritin: 41 ng/ml (ref 9–269)

## 2016-10-28 LAB — VITAMIN B12: Vitamin B12: 457 pg/mL (ref 232–1245)

## 2016-10-28 LAB — IRON AND TIBC
%SAT: 23 % (ref 21–57)
Iron: 70 ug/dL (ref 41–142)
TIBC: 310 ug/dL (ref 236–444)
UIBC: 240 ug/dL (ref 120–384)

## 2016-10-29 ENCOUNTER — Telehealth: Payer: Self-pay

## 2016-10-29 NOTE — Telephone Encounter (Signed)
Per Dr. Irene Limbo, shared with pt that ferritin was 41 and that he recommended she receive IV injectafer next week. Pt willing to come for infusion. Scheduling message sent. Goal ferritin > 100 per Dr. Irene Limbo.

## 2016-10-30 ENCOUNTER — Telehealth: Payer: Self-pay | Admitting: Hematology

## 2016-10-30 NOTE — Telephone Encounter (Signed)
Scheduled appt per 9/26 sch message - patient is aware of appt date and time.

## 2016-11-07 ENCOUNTER — Ambulatory Visit (HOSPITAL_BASED_OUTPATIENT_CLINIC_OR_DEPARTMENT_OTHER): Payer: PPO

## 2016-11-07 VITALS — BP 166/83 | HR 76 | Temp 98.3°F | Resp 18

## 2016-11-07 DIAGNOSIS — D500 Iron deficiency anemia secondary to blood loss (chronic): Secondary | ICD-10-CM | POA: Diagnosis not present

## 2016-11-07 MED ORDER — SODIUM CHLORIDE 0.9 % IV SOLN
750.0000 mg | Freq: Once | INTRAVENOUS | Status: AC
  Administered 2016-11-07: 750 mg via INTRAVENOUS
  Filled 2016-11-07: qty 15

## 2016-11-07 MED ORDER — SODIUM CHLORIDE 0.9 % IV SOLN
INTRAVENOUS | Status: DC
  Administered 2016-11-07: 15:00:00 via INTRAVENOUS

## 2016-11-07 NOTE — Patient Instructions (Signed)
Ferric carboxymaltose injection What is this medicine? FERRIC CARBOXYMALTOSE (ferr-ik car-box-ee-mol-toes) is an iron complex. Iron is used to make healthy red blood cells, which carry oxygen and nutrients throughout the body. This medicine is used to treat anemia in people with chronic kidney disease or people who cannot take iron by mouth. This medicine may be used for other purposes; ask your health care provider or pharmacist if you have questions. COMMON BRAND NAME(S): Injectafer What should I tell my health care provider before I take this medicine? They need to know if you have any of these conditions: -anemia not caused by low iron levels -high levels of iron in the blood -liver disease -an unusual or allergic reaction to iron, other medicines, foods, dyes, or preservatives -pregnant or trying to get pregnant -breast-feeding How should I use this medicine? This medicine is for infusion into a vein. It is given by a health care professional in a hospital or clinic setting. Talk to your pediatrician regarding the use of this medicine in children. Special care may be needed. Overdosage: If you think you have taken too much of this medicine contact a poison control center or emergency room at once. NOTE: This medicine is only for you. Do not share this medicine with others. What if I miss a dose? It is important not to miss your dose. Call your doctor or health care professional if you are unable to keep an appointment. What may interact with this medicine? Do not take this medicine with any of the following medications: -deferoxamine -dimercaprol -other iron products This medicine may also interact with the following medications: -chloramphenicol -deferasirox This list may not describe all possible interactions. Give your health care provider a list of all the medicines, herbs, non-prescription drugs, or dietary supplements you use. Also tell them if you smoke, drink alcohol, or use  illegal drugs. Some items may interact with your medicine. What should I watch for while using this medicine? Visit your doctor or health care professional regularly. Tell your doctor if your symptoms do not start to get better or if they get worse. You may need blood work done while you are taking this medicine. You may need to follow a special diet. Talk to your doctor. Foods that contain iron include: whole grains/cereals, dried fruits, beans, or peas, leafy green vegetables, and organ meats (liver, kidney). What side effects may I notice from receiving this medicine? Side effects that you should report to your doctor or health care professional as soon as possible: -allergic reactions like skin rash, itching or hives, swelling of the face, lips, or tongue -breathing problems -changes in blood pressure -feeling faint or lightheaded, falls -flushing, sweating, or hot feelings Side effects that usually do not require medical attention (report to your doctor or health care professional if they continue or are bothersome): -changes in taste -constipation -dizziness -headache -nausea -pain, redness, or irritation at site where injected -vomiting This list may not describe all possible side effects. Call your doctor for medical advice about side effects. You may report side effects to FDA at 1-800-FDA-1088. Where should I keep my medicine? This drug is given in a hospital or clinic and will not be stored at home. NOTE: This sheet is a summary. It may not cover all possible information. If you have questions about this medicine, talk to your doctor, pharmacist, or health care provider.  2018 Elsevier/Gold Standard (2015-02-22 11:20:47)  

## 2016-11-28 DIAGNOSIS — N3946 Mixed incontinence: Secondary | ICD-10-CM | POA: Diagnosis not present

## 2016-11-28 DIAGNOSIS — R351 Nocturia: Secondary | ICD-10-CM | POA: Diagnosis not present

## 2016-11-28 DIAGNOSIS — R350 Frequency of micturition: Secondary | ICD-10-CM | POA: Diagnosis not present

## 2016-12-03 DIAGNOSIS — D485 Neoplasm of uncertain behavior of skin: Secondary | ICD-10-CM | POA: Diagnosis not present

## 2016-12-03 DIAGNOSIS — R42 Dizziness and giddiness: Secondary | ICD-10-CM | POA: Diagnosis not present

## 2016-12-03 DIAGNOSIS — E785 Hyperlipidemia, unspecified: Secondary | ICD-10-CM | POA: Diagnosis not present

## 2016-12-03 DIAGNOSIS — Z0000 Encounter for general adult medical examination without abnormal findings: Secondary | ICD-10-CM | POA: Diagnosis not present

## 2016-12-03 DIAGNOSIS — H9113 Presbycusis, bilateral: Secondary | ICD-10-CM | POA: Diagnosis not present

## 2016-12-03 DIAGNOSIS — Z23 Encounter for immunization: Secondary | ICD-10-CM | POA: Diagnosis not present

## 2016-12-03 DIAGNOSIS — I70219 Atherosclerosis of native arteries of extremities with intermittent claudication, unspecified extremity: Secondary | ICD-10-CM | POA: Diagnosis not present

## 2016-12-03 DIAGNOSIS — R002 Palpitations: Secondary | ICD-10-CM | POA: Diagnosis not present

## 2016-12-03 DIAGNOSIS — Z1389 Encounter for screening for other disorder: Secondary | ICD-10-CM | POA: Diagnosis not present

## 2016-12-03 DIAGNOSIS — M810 Age-related osteoporosis without current pathological fracture: Secondary | ICD-10-CM | POA: Diagnosis not present

## 2016-12-03 DIAGNOSIS — R1032 Left lower quadrant pain: Secondary | ICD-10-CM | POA: Diagnosis not present

## 2016-12-04 DIAGNOSIS — E039 Hypothyroidism, unspecified: Secondary | ICD-10-CM | POA: Diagnosis not present

## 2016-12-04 DIAGNOSIS — Z79899 Other long term (current) drug therapy: Secondary | ICD-10-CM | POA: Diagnosis not present

## 2016-12-04 DIAGNOSIS — R5383 Other fatigue: Secondary | ICD-10-CM | POA: Diagnosis not present

## 2016-12-04 DIAGNOSIS — E7800 Pure hypercholesterolemia, unspecified: Secondary | ICD-10-CM | POA: Diagnosis not present

## 2016-12-09 DIAGNOSIS — R2689 Other abnormalities of gait and mobility: Secondary | ICD-10-CM | POA: Diagnosis not present

## 2016-12-09 DIAGNOSIS — R2681 Unsteadiness on feet: Secondary | ICD-10-CM | POA: Diagnosis not present

## 2017-01-05 DIAGNOSIS — R2681 Unsteadiness on feet: Secondary | ICD-10-CM | POA: Diagnosis not present

## 2017-01-05 DIAGNOSIS — R2689 Other abnormalities of gait and mobility: Secondary | ICD-10-CM | POA: Diagnosis not present

## 2017-01-22 DIAGNOSIS — R351 Nocturia: Secondary | ICD-10-CM | POA: Diagnosis not present

## 2017-01-22 DIAGNOSIS — N3946 Mixed incontinence: Secondary | ICD-10-CM | POA: Diagnosis not present

## 2017-01-22 DIAGNOSIS — R350 Frequency of micturition: Secondary | ICD-10-CM | POA: Diagnosis not present

## 2017-02-06 ENCOUNTER — Telehealth: Payer: Self-pay

## 2017-02-06 NOTE — Telephone Encounter (Signed)
Spoke with pt husband in response to VM left yesterday to verify symptoms pt has been experiencing. In r/t iron deficiency anemia, husband expressed that her symptoms have been the same as usual, except she is not chewing on ice yet. Pt is experiencing low energy and some blueness around lips intermittently. Pt denies SOB, pain, changes in eating or bowel habits. Pt's mobility and cognition has not been altered according to husband's report. Scheduling message sent for lab and doctor visit to be added.

## 2017-02-09 ENCOUNTER — Telehealth: Payer: Self-pay | Admitting: Hematology

## 2017-02-09 NOTE — Telephone Encounter (Signed)
Scheduled appt per 1/4 sch message - Patient is aware of appt date and time.

## 2017-02-13 ENCOUNTER — Inpatient Hospital Stay: Payer: PPO

## 2017-02-13 ENCOUNTER — Inpatient Hospital Stay: Payer: PPO | Attending: Hematology | Admitting: Hematology

## 2017-02-13 ENCOUNTER — Encounter: Payer: Self-pay | Admitting: Hematology

## 2017-02-13 ENCOUNTER — Telehealth: Payer: Self-pay | Admitting: Hematology

## 2017-02-13 VITALS — BP 152/72 | HR 77 | Temp 97.5°F | Resp 18 | Ht 62.0 in | Wt 102.2 lb

## 2017-02-13 DIAGNOSIS — Z87442 Personal history of urinary calculi: Secondary | ICD-10-CM | POA: Diagnosis not present

## 2017-02-13 DIAGNOSIS — D500 Iron deficiency anemia secondary to blood loss (chronic): Secondary | ICD-10-CM

## 2017-02-13 DIAGNOSIS — I10 Essential (primary) hypertension: Secondary | ICD-10-CM | POA: Diagnosis not present

## 2017-02-13 DIAGNOSIS — E538 Deficiency of other specified B group vitamins: Secondary | ICD-10-CM

## 2017-02-13 DIAGNOSIS — Z803 Family history of malignant neoplasm of breast: Secondary | ICD-10-CM

## 2017-02-13 DIAGNOSIS — R197 Diarrhea, unspecified: Secondary | ICD-10-CM | POA: Diagnosis not present

## 2017-02-13 DIAGNOSIS — K219 Gastro-esophageal reflux disease without esophagitis: Secondary | ICD-10-CM

## 2017-02-13 DIAGNOSIS — E039 Hypothyroidism, unspecified: Secondary | ICD-10-CM

## 2017-02-13 DIAGNOSIS — K589 Irritable bowel syndrome without diarrhea: Secondary | ICD-10-CM

## 2017-02-13 DIAGNOSIS — Z800 Family history of malignant neoplasm of digestive organs: Secondary | ICD-10-CM

## 2017-02-13 DIAGNOSIS — R140 Abdominal distension (gaseous): Secondary | ICD-10-CM

## 2017-02-13 DIAGNOSIS — R5383 Other fatigue: Secondary | ICD-10-CM

## 2017-02-13 DIAGNOSIS — M7989 Other specified soft tissue disorders: Secondary | ICD-10-CM

## 2017-02-13 DIAGNOSIS — Z79899 Other long term (current) drug therapy: Secondary | ICD-10-CM | POA: Diagnosis not present

## 2017-02-13 DIAGNOSIS — Z90710 Acquired absence of both cervix and uterus: Secondary | ICD-10-CM | POA: Diagnosis not present

## 2017-02-13 LAB — CBC WITH DIFFERENTIAL (CANCER CENTER ONLY)
Basophils Absolute: 0.1 10*3/uL (ref 0.0–0.1)
Basophils Relative: 1 %
EOS ABS: 1.2 10*3/uL — AB (ref 0.0–0.5)
Eosinophils Relative: 18 %
HEMATOCRIT: 39.1 % (ref 34.8–46.6)
HEMOGLOBIN: 12.8 g/dL (ref 11.6–15.9)
LYMPHS ABS: 1.7 10*3/uL (ref 0.9–3.3)
Lymphocytes Relative: 26 %
MCH: 32.2 pg (ref 25.1–34.0)
MCHC: 32.7 g/dL (ref 31.5–36.0)
MCV: 98.5 fL (ref 79.5–101.0)
MONOS PCT: 5 %
Monocytes Absolute: 0.4 10*3/uL (ref 0.1–0.9)
NEUTROS PCT: 50 %
Neutro Abs: 3.3 10*3/uL (ref 1.5–6.5)
Platelet Count: 286 10*3/uL (ref 145–400)
RBC: 3.97 MIL/uL (ref 3.70–5.45)
RDW: 13.2 % (ref 11.2–16.1)
WBC Count: 6.5 10*3/uL (ref 3.9–10.3)

## 2017-02-13 LAB — COMPREHENSIVE METABOLIC PANEL
ALBUMIN: 3.2 g/dL — AB (ref 3.5–5.0)
ALK PHOS: 107 U/L (ref 40–150)
ALT: 24 U/L (ref 0–55)
ANION GAP: 6 (ref 3–11)
AST: 21 U/L (ref 5–34)
BILIRUBIN TOTAL: 0.8 mg/dL (ref 0.2–1.2)
BUN: 19 mg/dL (ref 7–26)
CALCIUM: 8.8 mg/dL (ref 8.4–10.4)
CO2: 28 mmol/L (ref 22–29)
Chloride: 107 mmol/L (ref 98–109)
Creatinine, Ser: 0.84 mg/dL (ref 0.60–1.10)
GLUCOSE: 111 mg/dL (ref 70–140)
Potassium: 4.3 mmol/L (ref 3.3–4.7)
Sodium: 141 mmol/L (ref 136–145)
TOTAL PROTEIN: 6.2 g/dL — AB (ref 6.4–8.3)

## 2017-02-13 LAB — IRON AND TIBC
Iron: 100 ug/dL (ref 41–142)
Saturation Ratios: 41 % (ref 21–57)
TIBC: 241 ug/dL (ref 236–444)
UIBC: 141 ug/dL

## 2017-02-13 LAB — FERRITIN: Ferritin: 252 ng/mL (ref 9–269)

## 2017-02-13 LAB — RETICULOCYTES
RBC.: 3.97 MIL/uL (ref 3.70–5.45)
RETIC CT PCT: 1.6 % (ref 0.7–2.1)
Retic Count, Absolute: 63.5 10*3/uL (ref 33.7–90.7)

## 2017-02-13 NOTE — Telephone Encounter (Signed)
Scheduled appt per 1/11 los - Gave patient AVS and calender per los.  

## 2017-02-13 NOTE — Progress Notes (Signed)
[name] Kitchen    HEMATOLOGY/ONCOLOGY FOLLOW UP NOTE  Date of Service: 02/13/2017   Patient Care Team: [name], MD as PCP - General (Internal Medicine)in [place]  CHIEF COMPLAINTS/PURPOSE OF CONSULTATION:  Follow up for iron deficiency anemia  Diagnosis: 1) Iron deficiency anemia due to chronic GI blood loss requiring intermitent IV iron infusions. Last received this on 03/28/2015 and 04/04/2015. Changed to IV Injectafer on 04/11/16 as needed.  2) B12 deficiency on po B complex replacement.  Treatment Prn IV Iron  INTERVAL HISTORY   [name] is here for her scheduled followup for iron deficiency anemia. She is doing well overall. Iron level is normal as well as her hgb level. She is accompanied today by her husband. Her husband states he noticed blue tint to her lips accompanied by fatigue, which was the reason for calling the clinic. ?related to being in the cold. This has resolved and patient feels well currently.   On review of systems, pt reports fatigue, diarrhea, bloating, mild swelling to the feet and denies fever, chills, night sweats and any other accompanying symptoms.   MEDICAL HISTORY:  Past Medical History:  Diagnosis Date  . Anemia    iron def  . B12 deficiency 08/10/2010  . Cataract    bilateral  . Esophageal stricture 09/05/2016  . Gait disorder 04/13/2014  . GERD with stricture   . Hypertension   . Iron deficiency anemia 08/10/2010  . Irritable bowel syndrome   . Kidney stones   . Thyroid disease    hypothyroid  Partial gastrectomy (for bleeding stomach ulcer 11 units)  SURGICAL HISTORY: Past Surgical History:  Procedure Laterality Date  . ABDOMINAL HYSTERECTOMY  1975  . APPENDECTOMY  1955  . CHOLECYSTECTOMY  1978  . COLONOSCOPY    . ESOPHAGEAL DILATION  multiple  . VAGOTOMY AND PYLOROPLASTY  1971    SOCIAL HISTORY: Social History   Socioeconomic History  . Marital status: Married    Spouse name: Dietrich Ke  . Number of children: 2  . Years of  education: 38  . Highest education level: Not on file  Social Needs  . Financial resource strain: Not on file  . Food insecurity - worry: Not on file  . Food insecurity - inability: Not on file  . Transportation needs - medical: Not on file  . Transportation needs - non-medical: Not on file  Occupational History  . Occupation: retired  Tobacco Use  . Smoking status: Never Smoker  . Smokeless tobacco: Never Used  Substance and Sexual Activity  . Alcohol use: Yes    Alcohol/week: 4.2 oz    Types: 7 Glasses of wine per week  . Drug use: No  . Sexual activity: Not on file  Other Topics Concern  . Not on file  Social History Narrative   Patient is right handed.   Patient drinks 1 cup of caffeine per day.    FAMILY HISTORY: Family History  Problem Relation Age of Onset  . Breast cancer Sister   . Colon cancer Sister        mets from breast  . Hypertension Brother   . Heart disease Father     ALLERGIES:  is allergic to fergon [ferrous gluconate]; ampicillin; cephalexin; codeine; famotidine; nizatidine; tagamet [cimetidine]; and salmon [fish allergy].  MEDICATIONS:  Current Outpatient Medications  Medication Sig Dispense Refill  . acetaminophen (TYLENOL) 325 MG tablet Take 650 mg by mouth every 6 (six) hours as needed. For aches/pains    . Ascorbic Acid (  VITAMIN C) 500 MG CAPS Take 500 mg by mouth 4 (four) times daily.      [name] Kitchen b complex vitamins capsule Take 1 capsule by mouth daily.    . Calcium Carbonate-Vitamin D (CALCIUM 500 + D PO) Take by mouth. 3 tablets daily    . diphenhydrAMINE (BENADRYL) 25 mg capsule Take 25 mg by mouth at bedtime as needed. For allergies    . estradiol (ESTRACE) 1 MG tablet Take 1 mg by mouth daily.    [name] Kitchen levothyroxine (SYNTHROID, LEVOTHROID) 112 MCG tablet Take 112 mcg by mouth daily before breakfast.    . loperamide (IMODIUM) 2 MG capsule Take 4 mg by mouth 4 (four) times daily as needed for diarrhea or loose stools.     [name] Kitchen losartan (COZAAR) 25 MG  tablet Take 12.5 mg by mouth daily. Takes 1/2 tablet daily     . MYRBETRIQ 50 MG TB24 tablet Take 50 mg by mouth daily.  11  . omeprazole (PRILOSEC) 40 MG capsule Take 1 capsule (40 mg total) by mouth daily before breakfast. 90 capsule 3   No current facility-administered medications for this visit.     REVIEW OF SYSTEMS:    10 Point review of Systems was done is negative except as noted above.  PHYSICAL EXAMINATION: ECOG PERFORMANCE STATUS: 2 - Symptomatic, <50% confined to bed  . Vitals:   02/13/17 1210  BP: (!) 152/72  Pulse: 77  Resp: 18  Temp: (!) 97.5 F (36.4 C)  SpO2: 98%   Filed Weights   02/13/17 1210  Weight: 102 lb 3.2 oz (46.4 kg)   .Body mass index is 18.69 kg/m.  GENERAL:alert, in no acute distress and comfortable SKIN: skin color, texture, turgor are normal, no rashes or significant lesions EYES: normal, conjunctiva are pink and non-injected, sclera clear OROPHARYNX:no exudate, no erythema and lips, buccal mucosa, and tongue normal  NECK: supple, no JVD, thyroid normal size, non-tender, without nodularity LYMPH:  no palpable lymphadenopathy in the cervical, axillary or inguinal LUNGS: clear to auscultation with normal respiratory effort HEART: regular rate & rhythm,  no murmurs and no lower extremity edema ABDOMEN: abdomen soft, non-tender, normoactive bowel sounds  Musculoskeletal: no cyanosis of digits and no clubbing  PSYCH: alert & oriented x 3 with fluent speech NEURO: no focal motor/sensory deficits  LABORATORY DATA:  I have reviewed the data as listed  Component     Latest Ref Rng & Units 02/13/2017  WBC Count     3.9 - 10.3 K/uL 6.5  RBC     3.70 - 5.45 MIL/uL 3.97  Hemoglobin     11.6 - 15.9 g/dL 12.8  HCT     34.8 - 46.6 % 39.1  MCV     79.5 - 101.0 fL 98.5  MCH     25.1 - 34.0 pg 32.2  MCHC     31.5 - 36.0 g/dL 32.7  RDW     11.2 - 16.1 % 13.2  Platelet Count     145 - 400 K/uL 286  Neutrophils     % 50  NEUT#     1.5 -  6.5 K/uL 3.3  Lymphocytes     % 26  Lymphocyte #     0.9 - 3.3 K/uL 1.7  Monocytes Relative     % 5  Monocyte #     0.1 - 0.9 K/uL 0.4  Eosinophil     % 18  Eosinophils Absolute     0.0 - 0.5 K/uL  1.2 (H)  Basophil     % 1  Basophils Absolute     0.0 - 0.1 K/uL 0.1  Sodium     136 - 145 mmol/L 141  Potassium     3.3 - 4.7 mmol/L 4.3  Chloride     98 - 109 mmol/L 107  CO2     22 - 29 mmol/L 28  Glucose     70 - 140 mg/dL 111  BUN     7 - 26 mg/dL 19  Creatinine     0.60 - 1.10 mg/dL 0.84  Calcium     8.4 - 10.4 mg/dL 8.8  Total Protein     6.4 - 8.3 g/dL 6.2 (L)  Albumin     3.5 - 5.0 g/dL 3.2 (L)  AST     5 - 34 U/L 21  ALT     0 - 55 U/L 24  Alkaline Phosphatase     40 - 150 U/L 107  Total Bilirubin     0.2 - 1.2 mg/dL 0.8  GFR, Est Non African American     >60 mL/min >60  GFR, Est African American     >60 mL/min >60  Anion gap     3 - 11 6  Iron     41 - 142 ug/dL 100  TIBC     236 - 444 ug/dL 241  Saturation Ratios     21 - 57 % 41  UIBC     ug/dL 141  Retic Ct Pct     0.7 - 2.1 % 1.6  RBC.     3.70 - 5.45 MIL/uL 3.97  Retic Count, Absolute     33.7 - 90.7 K/uL 63.5  Ferritin     9 - 269 ng/mL 252   . CBC Latest Ref Rng & Units 02/13/2017 10/27/2016 07/31/2016  WBC 3.9 - 10.3 10e3/uL - 7.8 7.6  Hemoglobin 11.6 - 15.9 g/dL - 13.0 13.4  Hematocrit 34.8 - 46.6 % 39.1 40.4 39.9  Platelets 145 - 400 10e3/uL - 305 262    . CMP Latest Ref Rng & Units 02/13/2017 10/27/2016 07/31/2016  Glucose 70 - 140 mg/dL 111 105 87  BUN 7 - 26 mg/dL 19 20.5 13.4  Creatinine 0.60 - 1.10 mg/dL 0.84 0.8 0.7  Sodium 136 - 145 mmol/L 141 140 142  Potassium 3.3 - 4.7 mmol/L 4.3 4.0 4.0  Chloride 98 - 109 mmol/L 107 - -  CO2 22 - 29 mmol/L 28 27 26   Calcium 8.4 - 10.4 mg/dL 8.8 9.1 9.1  Total Protein 6.4 - 8.3 g/dL 6.2(L) 6.6 6.0(L)  Total Bilirubin 0.2 - 1.2 mg/dL 0.8 0.47 0.66  Alkaline Phos 40 - 150 U/L 107 89 63  AST 5 - 34 U/L 21 19 24   ALT 0 - 55 U/L 24  24 17    . Lab Results  Component Value Date   IRON 100 02/13/2017   TIBC 241 02/13/2017   IRONPCTSAT 41 02/13/2017   (Iron and TIBC)  Lab Results  Component Value Date   FERRITIN 252 02/13/2017   Component     Latest Ref Rng & Units 10/27/2016  Iron     41 - 142 ug/dL 70  TIBC     236 - 444 ug/dL 310  UIBC     120 - 384 ug/dL 240  %SAT     21 - 57 % 23  Ferritin     9 - 269 ng/ml 41  B12 - 503 RADIOGRAPHIC STUDIES: I have personally reviewed the radiological images as listed and agreed with the findings in the report. No results found.  ASSESSMENT & PLAN:   84 yo caucasian female with   1) CHronic iron deficiency anemia due to chronic GI blood loss and likely issues with absorption related to partial gastrectomy. Also patient has had intolerance to po iron. Ferritin levels satisfactory after IV iron currently at 252 2) h/o B12 deficiency - level stable currently with B complex replacement. Plan -patient hgb and ferritin is satisfactory since her last IV injectafer -prn IV injectafer with a goal to maintain ferritin >100. -continue B12 and B complex -rpt labs in 3 months and labs with clinic visit in 6 months.  Can cancel clinic appointment with Dr Irene Limbo on 04/27/2016. Labs in 3 months RTC with labs in 6 months with Dr Irene Limbo  All of the patients questions were answered with apparent satisfaction. The patient knows to call the clinic with any problems, questions or concerns.  I spent 15 minutes counseling the patient face to face. The total time spent in the appointment was 20 minutes and more than 50% was on counseling and direct patient cares.   This document serves as a record of services personally performed by Sullivan Lone, MD. It was created on her behalf by Alean Rinne, a trained medical scribe. The creation of this record is based on the scribe's personal observations and the provider's statements to them. This document has been checked and approved by the  attending provider.  .I have reviewed the above documentation for accuracy and completeness, and I agree with the above.   Sullivan Lone MD [place] AAHIVMS [facility] Hematology/Oncology Physician [facility]  (Office):       [phone] (Work cell):  [phone] (Fax):           [phone]

## 2017-02-13 NOTE — Patient Instructions (Signed)
Thank you for choosing [facility] to provide your oncology and hematology care.  To afford each patient quality time with our providers, please arrive 30 minutes before your scheduled appointment time.  If you arrive late for your appointment, you may be asked to reschedule.  We strive to give you quality time with our providers, and arriving late affects you and other patients whose appointments are after yours.   If you are a no show for multiple scheduled visits, you may be dismissed from the clinic at the providers discretion.    Again, thank you for choosing [facility], our hope is that these requests will decrease the amount of time that you wait before being seen by our physicians.  ______________________________________________________________________  Should you have questions after your visit to the [facility], please contact our office at [phone] between the hours of 8:30 and 4:30 p.m.    Voicemails left after 4:30p.m will not be returned until the following business day.    For prescription refill requests, please have your pharmacy contact us directly.  Please also try to allow 48 hours for prescription requests.    Please contact the scheduling department for questions regarding scheduling.  For scheduling of procedures such as PET scans, CT scans, MRI, Ultrasound, etc please contact central scheduling at [phone].    Resources For Cancer Patients and Caregivers:   Oncolink.org:  A wonderful resource for patients and healthcare providers for information regarding your disease, ways to tract your treatment, what to expect, etc.     American Cancer Society:  800-227-2345  Can help patients locate various types of support and financial assistance  Cancer Care: 1-800-813-HOPE (4673) Provides financial assistance, online support groups, medication/co-pay assistance.    [place] DSS:  336-641-3447 Where to apply for food  stamps, Medicaid, and utility assistance  Medicare Rights Center: 800-333-4114 Helps people with Medicare understand their rights and benefits, navigate the Medicare system, and secure the quality healthcare they deserve  SCAT: 336-333-6589 [place] [employer]'s shared-ride transportation service for eligible riders who have a disability that prevents them from riding the fixed route bus.    For additional information on assistance programs please contact our social worker:   Grier Hock/Abigail Elmore:  [phone]            

## 2017-03-11 ENCOUNTER — Encounter (HOSPITAL_COMMUNITY): Payer: Self-pay | Admitting: Emergency Medicine

## 2017-03-11 ENCOUNTER — Ambulatory Visit (HOSPITAL_COMMUNITY)
Admission: EM | Admit: 2017-03-11 | Discharge: 2017-03-11 | Disposition: A | Discharge status: Home / Self Care | Payer: PPO | Attending: Family Medicine | Admitting: Family Medicine

## 2017-03-11 ENCOUNTER — Other Ambulatory Visit: Payer: Self-pay

## 2017-03-11 DIAGNOSIS — S0081XA Abrasion of other part of head, initial encounter: Secondary | ICD-10-CM

## 2017-03-11 DIAGNOSIS — W101XXA Fall (on)(from) sidewalk curb, initial encounter: Secondary | ICD-10-CM | POA: Diagnosis not present

## 2017-03-11 DIAGNOSIS — S0990XA Unspecified injury of head, initial encounter: Secondary | ICD-10-CM | POA: Diagnosis not present

## 2017-03-11 NOTE — ED Triage Notes (Signed)
Pt states she misstepped on the curb and fell forward and hit her head. Denies LOC, denies being on blood thinners. Large wound to front of forehead, gauze applied. Pt is AAOx4.

## 2017-03-11 NOTE — Discharge Instructions (Signed)
Follow up with your primary care doctor or here within 48-72 hours. Do your best to ensure adequate rest. Take acetaminophen (Tylenol) every 4-6 hours as needed for pain. Often individuals will develop a headache associated with mild nausea in the days or hours after a head injury. This is called a concussion and may require further follow up. ° °Please seek prompt medical care if: °You have: °A very bad (severe) headache that is not helped by medicine. °Trouble walking or weakness in your arms and legs. °Clear or bloody fluid coming from your nose or ears. °Changes in your seeing (vision). °Jerky movements that you cannot control (seizure). °You throw up (vomit). °Your symptoms get worse. °You lose balance. °Your speech is slurred. °You pass out. °You are sleepier and have trouble staying awake. °The black centers of your eyes (pupils) change in size. ° °These symptoms may be an emergency. Do not wait to see if the symptoms will go away. Get medical help right away. Call your local emergency services. Do not drive yourself to the hospital. °

## 2017-03-12 NOTE — ED Provider Notes (Signed)
[place]   [id number] 03/11/17 Arrival Time: 4098  ASSESSMENT & PLAN:  1. Injury of head, initial encounter   2. Forehead abrasion, initial encounter    Verbal and written head injury precautions given.  Will use OTC analgesics as needed for discomfort. Gentle wound care instructions given for forehead abrasion.  Will f/u with her doctor to recheck her BP if she sees increased readings at home.  Reviewed expectations re: course of current medical issues. Questions answered. Outlined signs and symptoms indicating need for more acute intervention. Patient verbalized understanding. After Visit Summary given.  SUBJECTIVE: History from: patient and family. SURENA WELGE is a 84 y.o. female who presents with complaint of a head injury today. She reports falling forward over a curb and hitting her forehead against the pavement. Braced herself with arms. LOC: no loss of consciousness. Ambulatory since injury. Reports forehead abrasion and bleeding. No extremity sensation changes or weakness. No associated n/v. No visual changes. Normal bowel and bladder habits. OTC treatment: has not tried OTCs for relief of pain. Checked her BP at home and found it elevated immediately after injury. Decided to come in for evaluation. Feels her Td is UTD.  History of head injury or concussion: no.  ROS: As per HPI.  OBJECTIVE:  Vitals:   03/11/17 1556  BP: (!) 164/79  Pulse: 86  Resp: 18  Temp: 98.1 F (36.7 C)  SpO2: 100%     Glascow Coma Scale: 15  General appearance: alert; no distress HEENT: normocephalic; atraumatic; conjunctivae normal; TMs normal; oral mucosa normal Neck: supple with FROM; no midline cervical tenderness or deformity; no anterior mass or crepitus; trachea midline Lungs: clear to auscultation bilaterally Heart: regular rate and rhythm Abdomen: soft, non-tender; no bruising Back: no midline tenderness Extremities: moves all extremities normally; no  cyanosis or edema; symmetrical with no gross deformities Skin: warm and dry; approx 2x2 cm abrasion/hematoma over R forehead; no lacerations; no active bleeding Neurologic: normal gait with her walker Psychological: alert and cooperative; normal mood and affect    Allergies  Allergen Reactions  . Fergon [Ferrous Gluconate] Nausea And Vomiting and Other (See Comments)    Back pain  . Ampicillin Other (See Comments)    Jumpy legs  . Cephalexin     Jumpy legs  . Codeine Nausea Only  . Famotidine Other (See Comments)    jittery  . Nizatidine Other (See Comments)    jittery  . Tagamet [Cimetidine] Other (See Comments)    jittery  . Salmon [Fish Allergy] Itching and Rash   Past Medical History:  Diagnosis Date  . Anemia    iron def  . B12 deficiency 08/10/2010  . Cataract    bilateral  . Esophageal stricture 09/05/2016  . Gait disorder 04/13/2014  . GERD with stricture   . Hypertension   . Iron deficiency anemia 08/10/2010  . Irritable bowel syndrome   . Kidney stones   . Thyroid disease    hypothyroid   Past Surgical History:  Procedure Laterality Date  . ABDOMINAL HYSTERECTOMY  1975  . APPENDECTOMY  1955  . CHOLECYSTECTOMY  1978  . COLONOSCOPY    . ESOPHAGEAL DILATION  multiple  . VAGOTOMY AND PYLOROPLASTY  1971   Family History  Problem Relation Age of Onset  . Breast cancer Sister   . Colon cancer Sister        mets from breast  . Hypertension Brother   . Heart disease Father  Vanessa Kick, MD 03/12/17 [id number]

## 2017-04-27 ENCOUNTER — Ambulatory Visit: Payer: PPO | Admitting: Hematology

## 2017-04-27 ENCOUNTER — Other Ambulatory Visit: Payer: PPO

## 2017-04-30 DIAGNOSIS — R350 Frequency of micturition: Secondary | ICD-10-CM | POA: Diagnosis not present

## 2017-04-30 DIAGNOSIS — N3946 Mixed incontinence: Secondary | ICD-10-CM | POA: Diagnosis not present

## 2017-05-13 ENCOUNTER — Inpatient Hospital Stay: Payer: PPO | Attending: Hematology

## 2017-05-13 DIAGNOSIS — D500 Iron deficiency anemia secondary to blood loss (chronic): Secondary | ICD-10-CM | POA: Diagnosis not present

## 2017-05-13 LAB — CBC WITH DIFFERENTIAL (CANCER CENTER ONLY)
BASOS PCT: 1 %
Basophils Absolute: 0.1 10*3/uL (ref 0.0–0.1)
Eosinophils Absolute: 0.9 10*3/uL — ABNORMAL HIGH (ref 0.0–0.5)
Eosinophils Relative: 12 %
HCT: 40 % (ref 34.8–46.6)
HEMOGLOBIN: 13.1 g/dL (ref 11.6–15.9)
LYMPHS ABS: 1.6 10*3/uL (ref 0.9–3.3)
Lymphocytes Relative: 21 %
MCH: 32.1 pg (ref 25.1–34.0)
MCHC: 32.8 g/dL (ref 31.5–36.0)
MCV: 98 fL (ref 79.5–101.0)
MONOS PCT: 7 %
Monocytes Absolute: 0.5 10*3/uL (ref 0.1–0.9)
NEUTROS PCT: 59 %
Neutro Abs: 4.7 10*3/uL (ref 1.5–6.5)
PLATELETS: 238 10*3/uL (ref 145–400)
RBC: 4.08 MIL/uL (ref 3.70–5.45)
RDW: 12.8 % (ref 11.2–14.5)
WBC Count: 7.8 10*3/uL (ref 3.9–10.3)

## 2017-05-13 LAB — IRON AND TIBC
Iron: 71 ug/dL (ref 41–142)
Saturation Ratios: 25 % (ref 21–57)
TIBC: 288 ug/dL (ref 236–444)
UIBC: 216 ug/dL

## 2017-05-13 LAB — FERRITIN: Ferritin: 82 ng/mL (ref 9–269)

## 2017-05-13 LAB — RETICULOCYTES
RBC.: 4.08 MIL/uL (ref 3.70–5.45)
RETIC COUNT ABSOLUTE: 49 10*3/uL (ref 33.7–90.7)
RETIC CT PCT: 1.2 % (ref 0.7–2.1)

## 2017-07-27 DIAGNOSIS — G20 Parkinson's disease: Secondary | ICD-10-CM | POA: Diagnosis not present

## 2017-07-28 ENCOUNTER — Encounter: Payer: Self-pay | Admitting: Neurology

## 2017-08-04 ENCOUNTER — Encounter: Payer: Self-pay | Admitting: *Deleted

## 2017-08-04 NOTE — Progress Notes (Signed)
[name] [name] was seen today in the movement disorders clinic for neurologic consultation at the request of [name] Dy, MD.  The consultation is for the evaluation of Parkinsons Disease.  The records that were made available to me were reviewed from the patients referring physician as well as [facility] (2016 notes).    Patient was seen by Dr. Jannifer [name] of [facility] neurology in 2016 for mild trouble ambulating for at least 5 years prior to the day in which she saw him in 2016.  At that point, he reported "no evidence of parkinsonism."  EMG was completed and there was no evidence of peripheral neuropathy.  There was evidence of peroneal neuropathy.  She was referred for his physical therapy.  She was not seen at [facility] neurology after that.  Notes from primary care indicate that she was seen at [facility] last month and diagnosed with "Parkinson's plus."  Care everywhere was queried and there were no notes from [facility].  We contacted [facility] and she wasn't seen at the "regular" [facility] but was apparently seen in the research clinic and those notes are not part of regular [facility] records.  We contacted the research clinic several times and were told to leave and message and that it would take 3-5 days for notes to be sent.  Husband states they were told that patients memory was good and that she had parkinsons plus syndrome.  It sounds like they were possibly seen in neurocognitive clinic    Specific Symptoms:  Tremor: No. Family hx of similar:  No. Voice: weaker per husband, hoarse per daughter Sleep: sleeping well  Vivid Dreams:  No.  Acting out dreams:  No. Wet Pillows: No. Postural symptoms:  Yes.   x 4+ years (and daughter thinks since 2011)  Falls?  Yes.   was in the emergency room on March 11, 2017 with a fall in which she hit her head on a curb.  Records are reviewed. (may go months without falling and then have a few in a row, but that is unusual.  Uses walker most of the time.   Has never with the walker.  Can fall forward  Or backward) Bradykinesia symptoms: shuffling gait, slow movements and difficulty getting out of a chair Loss of smell:  No. Loss of taste:  No. Urinary Incontinence:  Yes.   (better recently since meds recently) Bowel incontinence: several years of loss of bowels Difficulty Swallowing:  No. Handwriting, micrographia: Yes.   Trouble with ADL's:  No.  Trouble buttoning clothing: No., except shirt sleeve Depression:  No. Memory changes:  Yes.   (short term) Hallucinations:  No.  visual distortions: No. N/V:  No. Lightheaded:  Only one episode few days ago  Syncope: No. Diplopia:  No. but had blurry vision Dyskinesia:  No.    MRI of the brain done in 2016 demonstrated moderate small vessel disease and moderate atrophy.  I reviewed that personally.  PREVIOUS MEDICATIONS: none to date  ALLERGIES:   Allergies  Allergen Reactions  . Fergon [Ferrous Gluconate] Nausea And Vomiting and Other (See Comments)    Back pain  . Ampicillin Other (See Comments)    Jumpy legs  . Cephalexin     Jumpy legs  . Codeine Nausea Only  . Famotidine Other (See Comments)    jittery  . Nizatidine Other (See Comments)    jittery  . Tagamet [Cimetidine] Other (See Comments)    jittery  . Salmon [Fish Allergy] Itching and  Rash    CURRENT MEDICATIONS:  Outpatient Encounter Medications as of 08/05/2017  Medication Sig  . amLODipine (NORVASC) 5 MG tablet Take 5 mg by mouth daily.  [name] MYRBETRIQ 50 MG TB24 tablet Take 50 mg by mouth daily.  . [DISCONTINUED] acetaminophen (TYLENOL) 325 MG tablet Take 650 mg by mouth every 6 (six) hours as needed. For aches/pains  . [DISCONTINUED] Ascorbic Acid (VITAMIN C) 500 MG CAPS Take 500 mg by mouth 4 (four) times daily.    . [DISCONTINUED] b complex vitamins capsule Take 1 capsule by mouth daily.  . [DISCONTINUED] Calcium Carbonate-Vitamin D (CALCIUM 500 + D PO) Take by mouth. 3 tablets daily  . [DISCONTINUED]  diphenhydrAMINE (BENADRYL) 25 mg capsule Take 25 mg by mouth at bedtime as needed. For allergies  . [DISCONTINUED] estradiol (ESTRACE) 1 MG tablet Take 1 mg by mouth daily.  . [DISCONTINUED] levothyroxine (SYNTHROID, LEVOTHROID) 112 MCG tablet Take 112 mcg by mouth daily before breakfast.  . [DISCONTINUED] loperamide (IMODIUM) 2 MG capsule Take 4 mg by mouth 4 (four) times daily as needed for diarrhea or loose stools.   . [DISCONTINUED] losartan (COZAAR) 25 MG tablet Take 12.5 mg by mouth daily. Takes 1/2 tablet daily   . [DISCONTINUED] omeprazole (PRILOSEC) 40 MG capsule Take 1 capsule (40 mg total) by mouth daily before breakfast.   No facility-administered encounter medications on file as of 08/05/2017.     PAST MEDICAL HISTORY:   Past Medical History:  Diagnosis Date  . Anemia    iron def  . B12 deficiency 08/10/2010  . Cataract    bilateral  . Esophageal stricture 09/05/2016  . Gait disorder 04/13/2014  . GERD with stricture   . Hypertension   . Iron deficiency anemia 08/10/2010  . Irritable bowel syndrome   . Kidney stones   . Thyroid disease    hypothyroid    PAST SURGICAL HISTORY:   Past Surgical History:  Procedure Laterality Date  . ABDOMINAL HYSTERECTOMY  1975  . APPENDECTOMY  1955  . CHOLECYSTECTOMY  1978  . COLONOSCOPY    . ESOPHAGEAL DILATION  multiple  . VAGOTOMY AND PYLOROPLASTY  1971    SOCIAL HISTORY:   Social History   Socioeconomic History  . Marital status: Married    Spouse name: [name] [name]  . Number of children: 2  . Years of education: 63  . Highest education level: Not on file  Occupational History  . Occupation: retired  Scientific laboratory technician  . Financial resource strain: Not on file  . Food insecurity:    Worry: Not on file    Inability: Not on file  . Transportation needs:    Medical: Not on file    Non-medical: Not on file  Tobacco Use  . Smoking status: Never Smoker  . Smokeless tobacco: Never Used  Substance and Sexual Activity  .  Alcohol use: Never    Frequency: Never  . Drug use: No  . Sexual activity: Not on file  Lifestyle  . Physical activity:    Days per week: Not on file    Minutes per session: Not on file  . Stress: Not on file  Relationships  . Social connections:    Talks on phone: Not on file    Gets together: Not on file    Attends religious service: Not on file    Active member of club or organization: Not on file    Attends meetings of clubs or organizations: Not on file    Relationship  status: Not on file  . Intimate partner violence:    Fear of current or ex partner: Not on file    Emotionally abused: Not on file    Physically abused: Not on file    Forced sexual activity: Not on file  Other Topics Concern  . Not on file  Social History Narrative   Patient is right handed.   Patient drinks 1 cup of caffeine per day.    FAMILY HISTORY:   Family Status  Relation Name Status  . Sister  Deceased  . Brother  Alive  . Mother  Deceased at age 107       "old age"  . Father  Deceased  . Sister  Alive    ROS:  Review of Systems  Constitutional: Negative.   HENT: Negative.   Respiratory: Negative.   Gastrointestinal:       Bowel incontinence  Genitourinary:       Urinary incontinence   Musculoskeletal: Negative.   Skin: Negative.   Neurological: Negative.   Psychiatric/Behavioral: Negative.     PHYSICAL EXAMINATION:    VITALS:   Vitals:   08/05/17 1016  Weight: 106 lb (48.1 kg)  Height: 5\' 3"  (1.6 m)    GEN:  The patient appears stated age and is in NAD. HEENT:  Normocephalic, atraumatic.  The mucous membranes are moist. The superficial temporal arteries are without ropiness or tenderness. CV:  RRR Lungs:  CTAB Neck/HEME:  There are no carotid bruits bilaterally.  Neurological examination:  Orientation: The patient is alert and oriented x3.  Fund of knowledge is appropriate.  Recent and remote memory are intact.  Attention and concentration are normal.    Able to name  objects and repeat phrases. Cranial nerves: There is good facial symmetry. There is mild facial hypomimia. Pupils are equal round and reactive to light bilaterally. Fundoscopic exam is attempted but the disc margins are not well visualized bilaterally. Extraocular muscles are intact with minimal downgaze paresis. The visual fields are full to confrontational testing. The speech is fluent and clear. Soft palate rises symmetrically and there is no tongue deviation. Hearing is intact to conversational tone. Sensation: Sensation is intact to light and pinprick throughout (facial, trunk, extremities). Vibration is intact at the bilateral big toe. There is no extinction with double simultaneous stimulation. There is no sensory dermatomal level identified. Motor: Strength is 5/5 in the bilateral upper and lower extremities.   Shoulder shrug is equal and symmetric.  There is no pronator drift. Deep tendon reflexes: Deep tendon reflexes are 3/4 at the bilateral biceps, triceps, brachioradialis, patella and achilles. Plantar responses are downgoing bilaterally.  Movement examination: Tone: There is normal tone in the bilateral upper extremities.  The tone in the lower extremities is normal.  Abnormal movements: no rest tremor.  Mild postural tremor Coordination:  There is no decremation with RAM's, with any form of RAMS, including alternating supination and pronation of the forearm, hand opening and closing, finger taps, heel taps and toe taps. Gait and Station: The patient has difficulty arising out of a deep-seated chair without the use of the hands. The patient's stride length is decreased with flexed arms with walking and she is unstable.    Labs: Lab work is completed on December 05, 2016.  Sodium is 140, potassium 4.5, chloride 106, CO2 28, BUN 16 and creatinine 0.63.  Hemoglobin A1c was 5.7.  AST 16, ALT 14.  White blood cells were 6.5, hemoglobin 13.1, hematocrit 38.6 and platelets 238.  TSH was elevated  at 6.88.  ASSESSMENT/PLAN:  1.  Gait instability   -I did tell the patient that she did not have Parkinson's disease.  If she has a parkinsonian syndrome, it would be one of the atypical states like PSP, although I am not completely convinced of this.  I will go ahead and schedule a DaT scan.  It should be noted that examination was very difficult today as the patient had significant bowel incontinence (chronic from surgery many years ago) just prior to the visit and this required extensive cleanup including changing of clothes and giving her disposable exam shorts but there was still fecal matter in the room and on the patient, which made examination difficult.  I did offer to r/s her but this was declined  -As above, we tried multiple times to call the research center at [facility] where she was previously seen last month and really have gotten nowhere with this.  My  suspicion was that she perhaps saw a neuropsychologist there and had neurocognitive testing, but really am unsure.  Her husband asked me about potentially getting motorized scooter or motorized wheelchair, but I had difficulty giving him recommendations without knowing what her neurocognitive testing showed.  I will continue to see if we can try to get records from the research center.  We talked about safety, both within and out of the home.  -Patient's husband did tell me that if DaTscan was negative, he really did not want to pursue anything further and would just like to go home and "live out life."  I think that is reasonable.  Much greater than 50% of this visit was spent in counseling and coordinating care.  Total face to face time:  60 min   Cc:  [name] Dy, MD

## 2017-08-05 ENCOUNTER — Encounter: Payer: Self-pay | Admitting: Neurology

## 2017-08-05 ENCOUNTER — Ambulatory Visit: Payer: PPO | Admitting: Neurology

## 2017-08-05 ENCOUNTER — Telehealth: Payer: Self-pay | Admitting: Neurology

## 2017-08-05 VITALS — Ht 63.0 in | Wt 106.0 lb

## 2017-08-05 DIAGNOSIS — R251 Tremor, unspecified: Secondary | ICD-10-CM | POA: Diagnosis not present

## 2017-08-05 DIAGNOSIS — R2681 Unsteadiness on feet: Secondary | ICD-10-CM

## 2017-08-05 DIAGNOSIS — R159 Full incontinence of feces: Secondary | ICD-10-CM | POA: Diagnosis not present

## 2017-08-05 NOTE — Telephone Encounter (Signed)
[facility] with research department at [facility] (ph [phone]) requesting records on patient to be faxed.

## 2017-08-05 NOTE — Patient Instructions (Signed)
1. We have sent a referral to [facility] for your DAT scan and they will call you directly to schedule your appt.. They are located at [address] If you need to contact them directly please call [phone].  

## 2017-08-07 NOTE — Addendum Note (Signed)
Addended byAnnamaria Helling on: 08/07/2017 09:02 AM   Modules accepted: Orders

## 2017-08-11 NOTE — Progress Notes (Signed)
[name] [name]    HEMATOLOGY/ONCOLOGY FOLLOW UP NOTE  Date of Service: 08/12/2017   Patient Care Team: [name], MD as PCP - [place] (Internal Medicine)[place]  CHIEF COMPLAINTS/PURPOSE OF CONSULTATION:  Follow up for iron deficiency anemia  Diagnosis: 1) Iron deficiency anemia due to chronic GI blood loss requiring intermitent IV iron infusions. Last received this on 03/28/2015 and 04/04/2015. Changed to IV Injectafer on 04/11/16 as needed.  2) B12 deficiency on po B complex replacement.  Treatment Prn IV Iron  INTERVAL HISTORY   [name] is here for management and evaluation of her iron deficiency anemia. The patient's last visit with [place] was on 02/13/17. She is accompanied today by her husband. The pt reports that she is doing well overall.   The pt reports that she has no new concerns and denies noticing any blood in the stools or other signs of bleeding. She has been doing well since her last IV Iron infusion 6 months ago.  She notes that she was diagnosed with "Parkinson's plus syndrome" at [facility], however she reports that her local neurologist Dr Wells Guiles Tat is not sure of this. She notes that her biggest problem is with walking.   Lab results today (08/12/17) of CBC w/diff and Reticulocytes is as follows: all values are WNL except for Eosinophils abs at 700. Ferritin 08/12/17 is pending  On review of systems, pt reports sleeping well, eating well, good energy levels, minimal leg swelling, and denies concerns for bleeding, abdominal pains and any other symptoms.   MEDICAL HISTORY:  Past Medical History:  Diagnosis Date  . Anemia    iron def  . B12 deficiency 08/10/2010  . Cataract    bilateral  . Esophageal stricture 09/05/2016  . Gait disorder 04/13/2014  . GERD with stricture   . Hypertension   . Iron deficiency anemia 08/10/2010  . Irritable bowel syndrome   . Kidney stones   . Thyroid disease    hypothyroid  Partial gastrectomy (for bleeding stomach ulcer 11  units)  SURGICAL HISTORY: Past Surgical History:  Procedure Laterality Date  . ABDOMINAL HYSTERECTOMY  1975  . APPENDECTOMY  1955  . CHOLECYSTECTOMY  1978  . COLONOSCOPY    . ESOPHAGEAL DILATION  multiple  . VAGOTOMY AND PYLOROPLASTY  1971    SOCIAL HISTORY: Social History   Socioeconomic History  . Marital status: Married    Spouse name: Milagros Middendorf  . Number of children: 2  . Years of education: 72  . Highest education level: Not on file  Occupational History  . Occupation: retired  Scientific laboratory technician  . Financial resource strain: Not on file  . Food insecurity:    Worry: Not on file    Inability: Not on file  . Transportation needs:    Medical: Not on file    Non-medical: Not on file  Tobacco Use  . Smoking status: Never Smoker  . Smokeless tobacco: Never Used  Substance and Sexual Activity  . Alcohol use: Never    Frequency: Never  . Drug use: No  . Sexual activity: Not on file  Lifestyle  . Physical activity:    Days per week: Not on file    Minutes per session: Not on file  . Stress: Not on file  Relationships  . Social connections:    Talks on phone: Not on file    Gets together: Not on file    Attends religious service: Not on file    Active member of club or  organization: Not on file    Attends meetings of clubs or organizations: Not on file    Relationship status: Not on file  . Intimate partner violence:    Fear of current or ex partner: Not on file    Emotionally abused: Not on file    Physically abused: Not on file    Forced sexual activity: Not on file  Other Topics Concern  . Not on file  Social History Narrative   Patient is right handed.   Patient drinks 1 cup of caffeine per day.    FAMILY HISTORY: Family History  Problem Relation Age of Onset  . Breast cancer Sister   . Colon cancer Sister        mets from breast  . Hypertension Brother   . Heart disease Father     ALLERGIES:  is allergic to fergon [ferrous gluconate];  ampicillin; cephalexin; codeine; famotidine; nizatidine; tagamet [cimetidine]; and salmon [fish allergy].  MEDICATIONS:  Current Outpatient Medications  Medication Sig Dispense Refill  . amLODipine (NORVASC) 5 MG tablet Take 5 mg by mouth daily.    [name] [name] MYRBETRIQ 50 MG TB24 tablet Take 50 mg by mouth daily.  11   No current facility-administered medications for this visit.     REVIEW OF SYSTEMS:    A 10+ POINT REVIEW OF SYSTEMS WAS OBTAINED including neurology, dermatology, psychiatry, cardiac, respiratory, lymph, extremities, GI, GU, Musculoskeletal, constitutional, breasts, reproductive, HEENT.  All pertinent positives are noted in the HPI.  All others are negative.   PHYSICAL EXAMINATION: ECOG PERFORMANCE STATUS: 2 - Symptomatic, <50% confined to bed  . Vitals:   08/12/17 1407  BP: (!) 156/68  Pulse: 80  Resp: 18  Temp: 98.2 F (36.8 C)  SpO2: 97%   Filed Weights   08/12/17 1407  Weight: 107 lb 14.4 oz (48.9 kg)   .Body mass index is 19.11 kg/m.  GENERAL:alert, in no acute distress and comfortable SKIN: no acute rashes, no significant lesions EYES: conjunctiva are pink and non-injected, sclera anicteric OROPHARYNX: MMM, no exudates, no oropharyngeal erythema or ulceration NECK: supple, no JVD LYMPH:  no palpable lymphadenopathy in the cervical, axillary or inguinal regions LUNGS: clear to auscultation b/l with normal respiratory effort HEART: regular rate & rhythm ABDOMEN:  normoactive bowel sounds , non tender, not distended. No palpable hepatosplenomegaly.  Extremity: no pedal edema PSYCH: alert & oriented x 3 with fluent speech NEURO: no focal motor/sensory deficits   LABORATORY DATA:  I have reviewed the data as listed  . CBC Latest Ref Rng & Units 08/12/2017 05/13/2017 02/13/2017  WBC 3.9 - 10.3 K/uL 6.0 7.8 6.5  Hemoglobin 11.6 - 15.9 g/dL 12.6 13.1 12.8  Hematocrit 34.8 - 46.6 % 38.3 40.0 39.1  Platelets 145 - 400 K/uL 219 238 286   . CBC    Component  Value Date/Time   WBC 6.0 08/12/2017 1351   WBC 7.8 10/27/2016 1443   WBC 9.1 01/22/2016 1106   RBC 3.97 08/12/2017 1351   RBC 3.97 08/12/2017 1351   HGB 12.6 08/12/2017 1351   HGB 13.0 10/27/2016 1443   HCT 38.3 08/12/2017 1351   HCT 40.4 10/27/2016 1443   PLT 219 08/12/2017 1351   PLT 305 10/27/2016 1443   MCV 96.5 08/12/2017 1351   MCV 98.1 10/27/2016 1443   MCH 31.7 08/12/2017 1351   MCHC 32.9 08/12/2017 1351   RDW 12.8 08/12/2017 1351   RDW 12.6 10/27/2016 1443   LYMPHSABS 1.6 08/12/2017 1351   LYMPHSABS 1.9  10/27/2016 1443   MONOABS 0.5 08/12/2017 1351   MONOABS 0.6 10/27/2016 1443   EOSABS 0.7 (H) 08/12/2017 1351   EOSABS 1.2 (H) 10/27/2016 1443   BASOSABS 0.1 08/12/2017 1351   BASOSABS 0.1 10/27/2016 1443    . CMP Latest Ref Rng & Units 02/13/2017 10/27/2016 07/31/2016  Glucose 70 - 140 mg/dL 111 105 87  BUN 7 - 26 mg/dL 19 20.5 13.4  Creatinine 0.60 - 1.10 mg/dL 0.84 0.8 0.7  Sodium 136 - 145 mmol/L 141 140 142  Potassium 3.3 - 4.7 mmol/L 4.3 4.0 4.0  Chloride 98 - 109 mmol/L 107 - -  CO2 22 - 29 mmol/L 28 27 26   Calcium 8.4 - 10.4 mg/dL 8.8 9.1 9.1  Total Protein 6.4 - 8.3 g/dL 6.2(L) 6.6 6.0(L)  Total Bilirubin 0.2 - 1.2 mg/dL 0.8 0.47 0.66  Alkaline Phos 40 - 150 U/L 107 89 63  AST 5 - 34 U/L 21 19 24   ALT 0 - 55 U/L 24 24 17    . Lab Results  Component Value Date   IRON 77 08/12/2017   TIBC 293 08/12/2017   IRONPCTSAT 26 08/12/2017   (Iron and TIBC)  Lab Results  Component Value Date   FERRITIN 105 08/12/2017   B12 - 503 RADIOGRAPHIC STUDIES: I have personally reviewed the radiological images as listed and agreed with the findings in the report. No results found.  ASSESSMENT & PLAN:   84 y.o. caucasian female with   1) CHronic iron deficiency anemia due to chronic GI blood loss and likely issues with absorption related to partial gastrectomy. Also patient has had intolerance to po iron.  2) h/o B12 deficiency - level stable currently with B  complex replacement. Plan:  -continue B12 and B complex -Discussed pt labwork today, 08/12/17;HGB normal, Ferritin is 105 -Will set pt up for IV Injectafer- given needing IV Iron predictably. -Pt seems to be needing IV Iron infusions every 6 months and we will plan accordingly  -Goal for Ferritin >100  IV injectafer weekly x 2 doses (starting within about 1 week) RTC with Dr Irene Limbo in 6 months with labs   All of the patients questions were answered with apparent satisfaction. The patient knows to call the clinic with any problems, questions or concerns.  The total time spent in the appt was 20 minutes and more than 50% was on counseling and direct patient cares.    Sullivan Lone MD [place] AAHIVMS [facility] Hematology/Oncology Physician [facility]  (Office):       [phone] (Work cell):  [phone] (Fax):           [phone]  I, Baldwin [place], am acting as a scribe for Dr Irene Limbo.   .I have reviewed the above documentation for accuracy and completeness, and I agree with the above. Brunetta Genera MD

## 2017-08-12 ENCOUNTER — Encounter: Payer: Self-pay | Admitting: Hematology

## 2017-08-12 ENCOUNTER — Telehealth: Payer: Self-pay | Admitting: Hematology

## 2017-08-12 ENCOUNTER — Inpatient Hospital Stay: Payer: PPO | Attending: Hematology | Admitting: Hematology

## 2017-08-12 ENCOUNTER — Inpatient Hospital Stay: Payer: PPO

## 2017-08-12 VITALS — BP 156/68 | HR 80 | Temp 98.2°F | Resp 18 | Ht 63.0 in | Wt 107.9 lb

## 2017-08-12 DIAGNOSIS — I10 Essential (primary) hypertension: Secondary | ICD-10-CM | POA: Diagnosis not present

## 2017-08-12 DIAGNOSIS — E538 Deficiency of other specified B group vitamins: Secondary | ICD-10-CM | POA: Diagnosis not present

## 2017-08-12 DIAGNOSIS — D500 Iron deficiency anemia secondary to blood loss (chronic): Secondary | ICD-10-CM

## 2017-08-12 DIAGNOSIS — Z79899 Other long term (current) drug therapy: Secondary | ICD-10-CM | POA: Diagnosis not present

## 2017-08-12 DIAGNOSIS — Z9049 Acquired absence of other specified parts of digestive tract: Secondary | ICD-10-CM | POA: Insufficient documentation

## 2017-08-12 DIAGNOSIS — K219 Gastro-esophageal reflux disease without esophagitis: Secondary | ICD-10-CM | POA: Diagnosis not present

## 2017-08-12 DIAGNOSIS — D509 Iron deficiency anemia, unspecified: Secondary | ICD-10-CM | POA: Insufficient documentation

## 2017-08-12 DIAGNOSIS — E039 Hypothyroidism, unspecified: Secondary | ICD-10-CM | POA: Diagnosis not present

## 2017-08-12 DIAGNOSIS — Z87442 Personal history of urinary calculi: Secondary | ICD-10-CM

## 2017-08-12 DIAGNOSIS — G20 Parkinson's disease: Secondary | ICD-10-CM | POA: Diagnosis not present

## 2017-08-12 LAB — RETICULOCYTES
RBC.: 3.97 MIL/uL (ref 3.70–5.45)
RETIC COUNT ABSOLUTE: 47.6 10*3/uL (ref 33.7–90.7)
RETIC CT PCT: 1.2 % (ref 0.7–2.1)

## 2017-08-12 LAB — IRON AND TIBC
Iron: 77 ug/dL (ref 41–142)
SATURATION RATIOS: 26 % (ref 21–57)
TIBC: 293 ug/dL (ref 236–444)
UIBC: 215 ug/dL

## 2017-08-12 LAB — CBC WITH DIFFERENTIAL (CANCER CENTER ONLY)
BASOS ABS: 0.1 10*3/uL (ref 0.0–0.1)
BASOS PCT: 1 %
EOS ABS: 0.7 10*3/uL — AB (ref 0.0–0.5)
EOS PCT: 12 %
HEMATOCRIT: 38.3 % (ref 34.8–46.6)
Hemoglobin: 12.6 g/dL (ref 11.6–15.9)
Lymphocytes Relative: 27 %
Lymphs Abs: 1.6 10*3/uL (ref 0.9–3.3)
MCH: 31.7 pg (ref 25.1–34.0)
MCHC: 32.9 g/dL (ref 31.5–36.0)
MCV: 96.5 fL (ref 79.5–101.0)
MONO ABS: 0.5 10*3/uL (ref 0.1–0.9)
Monocytes Relative: 8 %
NEUTROS ABS: 3.1 10*3/uL (ref 1.5–6.5)
Neutrophils Relative %: 52 %
PLATELETS: 219 10*3/uL (ref 145–400)
RBC: 3.97 MIL/uL (ref 3.70–5.45)
RDW: 12.8 % (ref 11.2–14.5)
WBC Count: 6 10*3/uL (ref 3.9–10.3)

## 2017-08-12 LAB — FERRITIN: FERRITIN: 105 ng/mL (ref 11–307)

## 2017-08-12 NOTE — Telephone Encounter (Signed)
Appointments scheduled AVS/Calendar printed per 7/10 loas

## 2017-08-17 ENCOUNTER — Telehealth: Payer: Self-pay | Admitting: Neurology

## 2017-08-17 NOTE — Telephone Encounter (Signed)
Luvenia Starch - got a note from radiology that pt declined to schedule.  Just confirming with them that they do not wish to proceed further

## 2017-08-17 NOTE — Telephone Encounter (Signed)
Left message on machine for patient to call back.

## 2017-08-18 ENCOUNTER — Inpatient Hospital Stay: Payer: PPO

## 2017-08-18 VITALS — BP 156/77 | HR 77 | Temp 97.7°F | Resp 16

## 2017-08-18 DIAGNOSIS — D509 Iron deficiency anemia, unspecified: Secondary | ICD-10-CM | POA: Diagnosis not present

## 2017-08-18 DIAGNOSIS — D500 Iron deficiency anemia secondary to blood loss (chronic): Secondary | ICD-10-CM

## 2017-08-18 MED ORDER — FERRIC CARBOXYMALTOSE 750 MG/15ML IV SOLN
750.0000 mg | Freq: Once | INTRAVENOUS | Status: AC
  Administered 2017-08-18: 750 mg via INTRAVENOUS
  Filled 2017-08-18: qty 15

## 2017-08-18 NOTE — Patient Instructions (Signed)
Ferric carboxymaltose injection What is this medicine? FERRIC CARBOXYMALTOSE (ferr-ik car-box-ee-mol-toes) is an iron complex. Iron is used to make healthy red blood cells, which carry oxygen and nutrients throughout the body. This medicine is used to treat anemia in people with chronic kidney disease or people who cannot take iron by mouth. This medicine may be used for other purposes; ask your health care provider or pharmacist if you have questions. COMMON BRAND NAME(S): Injectafer What should I tell my health care provider before I take this medicine? They need to know if you have any of these conditions: -anemia not caused by low iron levels -high levels of iron in the blood -liver disease -an unusual or allergic reaction to iron, other medicines, foods, dyes, or preservatives -pregnant or trying to get pregnant -breast-feeding How should I use this medicine? This medicine is for infusion into a vein. It is given by a health care professional in a hospital or clinic setting. Talk to your pediatrician regarding the use of this medicine in children. Special care may be needed. Overdosage: If you think you have taken too much of this medicine contact a poison control center or emergency room at once. NOTE: This medicine is only for you. Do not share this medicine with others. What if I miss a dose? It is important not to miss your dose. Call your doctor or health care professional if you are unable to keep an appointment. What may interact with this medicine? Do not take this medicine with any of the following medications: -deferoxamine -dimercaprol -other iron products This medicine may also interact with the following medications: -chloramphenicol -deferasirox This list may not describe all possible interactions. Give your health care provider a list of all the medicines, herbs, non-prescription drugs, or dietary supplements you use. Also tell them if you smoke, drink alcohol, or use  illegal drugs. Some items may interact with your medicine. What should I watch for while using this medicine? Visit your doctor or health care professional regularly. Tell your doctor if your symptoms do not start to get better or if they get worse. You may need blood work done while you are taking this medicine. You may need to follow a special diet. Talk to your doctor. Foods that contain iron include: whole grains/cereals, dried fruits, beans, or peas, leafy green vegetables, and organ meats (liver, kidney). What side effects may I notice from receiving this medicine? Side effects that you should report to your doctor or health care professional as soon as possible: -allergic reactions like skin rash, itching or hives, swelling of the face, lips, or tongue -breathing problems -changes in blood pressure -feeling faint or lightheaded, falls -flushing, sweating, or hot feelings Side effects that usually do not require medical attention (report to your doctor or health care professional if they continue or are bothersome): -changes in taste -constipation -dizziness -headache -nausea -pain, redness, or irritation at site where injected -vomiting This list may not describe all possible side effects. Call your doctor for medical advice about side effects. You may report side effects to FDA at 1-800-FDA-1088. Where should I keep my medicine? This drug is given in a hospital or clinic and will not be stored at home. NOTE: This sheet is a summary. It may not cover all possible information. If you have questions about this medicine, talk to your doctor, pharmacist, or health care provider.  2018 Elsevier/Gold Standard (2015-02-22 11:20:47)  

## 2017-08-25 ENCOUNTER — Inpatient Hospital Stay: Payer: PPO

## 2017-08-25 VITALS — BP 160/74 | HR 77 | Temp 98.3°F | Resp 18

## 2017-08-25 DIAGNOSIS — D509 Iron deficiency anemia, unspecified: Secondary | ICD-10-CM | POA: Diagnosis not present

## 2017-08-25 DIAGNOSIS — D500 Iron deficiency anemia secondary to blood loss (chronic): Secondary | ICD-10-CM

## 2017-08-25 MED ORDER — SODIUM CHLORIDE 0.9 % IV SOLN
750.0000 mg | Freq: Once | INTRAVENOUS | Status: AC
  Administered 2017-08-25: 750 mg via INTRAVENOUS
  Filled 2017-08-25: qty 15

## 2017-08-25 MED ORDER — SODIUM CHLORIDE 0.9 % IV SOLN
Freq: Once | INTRAVENOUS | Status: AC
  Administered 2017-08-25: 10:00:00 via INTRAVENOUS

## 2017-08-25 NOTE — Patient Instructions (Signed)
Ferric carboxymaltose injection What is this medicine? FERRIC CARBOXYMALTOSE (ferr-ik car-box-ee-mol-toes) is an iron complex. Iron is used to make healthy red blood cells, which carry oxygen and nutrients throughout the body. This medicine is used to treat anemia in people with chronic kidney disease or people who cannot take iron by mouth. This medicine may be used for other purposes; ask your health care provider or pharmacist if you have questions. COMMON BRAND NAME(S): Injectafer What should I tell my health care provider before I take this medicine? They need to know if you have any of these conditions: -anemia not caused by low iron levels -high levels of iron in the blood -liver disease -an unusual or allergic reaction to iron, other medicines, foods, dyes, or preservatives -pregnant or trying to get pregnant -breast-feeding How should I use this medicine? This medicine is for infusion into a vein. It is given by a health care professional in a hospital or clinic setting. Talk to your pediatrician regarding the use of this medicine in children. Special care may be needed. Overdosage: If you think you have taken too much of this medicine contact a poison control center or emergency room at once. NOTE: This medicine is only for you. Do not share this medicine with others. What if I miss a dose? It is important not to miss your dose. Call your doctor or health care professional if you are unable to keep an appointment. What may interact with this medicine? Do not take this medicine with any of the following medications: -deferoxamine -dimercaprol -other iron products This medicine may also interact with the following medications: -chloramphenicol -deferasirox This list may not describe all possible interactions. Give your health care provider a list of all the medicines, herbs, non-prescription drugs, or dietary supplements you use. Also tell them if you smoke, drink alcohol, or use  illegal drugs. Some items may interact with your medicine. What should I watch for while using this medicine? Visit your doctor or health care professional regularly. Tell your doctor if your symptoms do not start to get better or if they get worse. You may need blood work done while you are taking this medicine. You may need to follow a special diet. Talk to your doctor. Foods that contain iron include: whole grains/cereals, dried fruits, beans, or peas, leafy green vegetables, and organ meats (liver, kidney). What side effects may I notice from receiving this medicine? Side effects that you should report to your doctor or health care professional as soon as possible: -allergic reactions like skin rash, itching or hives, swelling of the face, lips, or tongue -breathing problems -changes in blood pressure -feeling faint or lightheaded, falls -flushing, sweating, or hot feelings Side effects that usually do not require medical attention (report to your doctor or health care professional if they continue or are bothersome): -changes in taste -constipation -dizziness -headache -nausea -pain, redness, or irritation at site where injected -vomiting This list may not describe all possible side effects. Call your doctor for medical advice about side effects. You may report side effects to FDA at 1-800-FDA-1088. Where should I keep my medicine? This drug is given in a hospital or clinic and will not be stored at home. NOTE: This sheet is a summary. It may not cover all possible information. If you have questions about this medicine, talk to your doctor, pharmacist, or health care provider.  2018 Elsevier/Gold Standard (2015-02-22 11:20:47)  

## 2017-11-25 ENCOUNTER — Telehealth: Payer: Self-pay | Admitting: Neurology

## 2017-11-25 NOTE — Telephone Encounter (Signed)
Please advise 

## 2017-11-25 NOTE — Telephone Encounter (Signed)
[name] [name] called from [facility] regarding this patient and the few times they have called she has denied the DAT Scan and they would like to see if that order can be cancelled? Thanks

## 2017-11-26 NOTE — Telephone Encounter (Signed)
I spoke with Lovena Le and requested for her to d/c the order.

## 2017-11-26 NOTE — Telephone Encounter (Signed)
Yes.  Luvenia Starch also tried to call them on 7/15 about that.

## 2017-12-07 DIAGNOSIS — H9113 Presbycusis, bilateral: Secondary | ICD-10-CM | POA: Diagnosis not present

## 2017-12-07 DIAGNOSIS — Z23 Encounter for immunization: Secondary | ICD-10-CM | POA: Diagnosis not present

## 2017-12-07 DIAGNOSIS — Z1289 Encounter for screening for malignant neoplasm of other sites: Secondary | ICD-10-CM | POA: Diagnosis not present

## 2017-12-07 DIAGNOSIS — E785 Hyperlipidemia, unspecified: Secondary | ICD-10-CM | POA: Diagnosis not present

## 2017-12-07 DIAGNOSIS — R002 Palpitations: Secondary | ICD-10-CM | POA: Diagnosis not present

## 2017-12-07 DIAGNOSIS — I70219 Atherosclerosis of native arteries of extremities with intermittent claudication, unspecified extremity: Secondary | ICD-10-CM | POA: Diagnosis not present

## 2017-12-07 DIAGNOSIS — R1032 Left lower quadrant pain: Secondary | ICD-10-CM | POA: Diagnosis not present

## 2017-12-07 DIAGNOSIS — R42 Dizziness and giddiness: Secondary | ICD-10-CM | POA: Diagnosis not present

## 2017-12-07 DIAGNOSIS — Z1389 Encounter for screening for other disorder: Secondary | ICD-10-CM | POA: Diagnosis not present

## 2017-12-07 DIAGNOSIS — Z0000 Encounter for general adult medical examination without abnormal findings: Secondary | ICD-10-CM | POA: Diagnosis not present

## 2017-12-07 DIAGNOSIS — Z1331 Encounter for screening for depression: Secondary | ICD-10-CM | POA: Diagnosis not present

## 2017-12-08 DIAGNOSIS — Z79899 Other long term (current) drug therapy: Secondary | ICD-10-CM | POA: Diagnosis not present

## 2017-12-08 DIAGNOSIS — E7800 Pure hypercholesterolemia, unspecified: Secondary | ICD-10-CM | POA: Diagnosis not present

## 2017-12-08 DIAGNOSIS — E039 Hypothyroidism, unspecified: Secondary | ICD-10-CM | POA: Diagnosis not present

## 2017-12-08 DIAGNOSIS — Z131 Encounter for screening for diabetes mellitus: Secondary | ICD-10-CM | POA: Diagnosis not present

## 2017-12-08 DIAGNOSIS — R5383 Other fatigue: Secondary | ICD-10-CM | POA: Diagnosis not present

## 2018-01-21 ENCOUNTER — Encounter (HOSPITAL_COMMUNITY): Payer: Self-pay | Admitting: Emergency Medicine

## 2018-01-21 ENCOUNTER — Ambulatory Visit (HOSPITAL_COMMUNITY)
Admission: EM | Admit: 2018-01-21 | Discharge: 2018-01-21 | Disposition: A | Discharge status: Home / Self Care | Payer: PPO | Attending: Family Medicine | Admitting: Family Medicine

## 2018-01-21 ENCOUNTER — Other Ambulatory Visit: Payer: Self-pay

## 2018-01-21 DIAGNOSIS — W2209XA Striking against other stationary object, initial encounter: Secondary | ICD-10-CM | POA: Diagnosis not present

## 2018-01-21 DIAGNOSIS — W06XXXA Fall from bed, initial encounter: Secondary | ICD-10-CM

## 2018-01-21 DIAGNOSIS — S56521A Laceration of other extensor muscle, fascia and tendon at forearm level, right arm, initial encounter: Secondary | ICD-10-CM | POA: Insufficient documentation

## 2018-01-21 DIAGNOSIS — S0101XA Laceration without foreign body of scalp, initial encounter: Secondary | ICD-10-CM | POA: Insufficient documentation

## 2018-01-21 MED ORDER — LIDOCAINE-EPINEPHRINE (PF) 2 %-1:200000 IJ SOLN
INTRAMUSCULAR | Status: AC
  Filled 2018-01-21: qty 20

## 2018-01-21 NOTE — ED Provider Notes (Signed)
[name]    CSN: [id number] Arrival date & time: 01/21/18  1045     History   Chief Complaint No chief complaint on file.   HPI [name] [name] is a 84 y.o. female.   [name] [name] established urgent care patient fell today injuring her head and arm.  Pt was getting out of bed and fell this morning at 4 am at her home [facility]).  She has suffered a laceration to the back of her head and to her right forearm.  Pt denies LOC.  Her last fall was in February.    Note from February 6 of this year: History from: patient and family. [name] [name] is a 84 y.o. female who presents with complaint of a head injury today. She reports falling forward over a curb and hitting her forehead against the pavement. Braced herself with arms. LOC: no loss of consciousness. Ambulatory since injury. Reports forehead abrasion and bleeding. No extremity sensation changes or weakness. No associated n/v. No visual changes. Normal bowel and bladder habits. OTC treatment: has not tried OTCs for relief of pain. Checked her BP at home and found it elevated immediately after injury. Decided to come in for evaluation. Feels her Td is UTD.     Past Medical History:  Diagnosis Date  . Anemia    iron def  . B12 deficiency 08/10/2010  . Cataract    bilateral  . Esophageal stricture 09/05/2016  . Gait disorder 04/13/2014  . GERD with stricture   . Hypertension   . Iron deficiency anemia 08/10/2010  . Irritable bowel syndrome   . Kidney stones   . Thyroid disease    hypothyroid    Patient Active Problem List   Diagnosis Date Noted  . Gait disorder 04/13/2014  . Eosinophilia 07/15/2013  . Iron deficiency anemia 08/10/2010  . B12 deficiency 08/10/2010    Past Surgical History:  Procedure Laterality Date  . ABDOMINAL HYSTERECTOMY  1975  . APPENDECTOMY  1955  . CHOLECYSTECTOMY  1978  . COLONOSCOPY    . ESOPHAGEAL DILATION  multiple  . VAGOTOMY AND PYLOROPLASTY  1971    OB History    No obstetric history on file.      Home Medications    Prior to Admission medications   Medication Sig Start Date End Date Taking? Authorizing Provider  amLODipine (NORVASC) 5 MG tablet Take 5 mg by mouth daily.    [provider]  MYRBETRIQ 50 MG TB24 tablet Take 50 mg by mouth daily. 01/15/17   [provider]    Family History Family History  Problem Relation Age of Onset  . Breast cancer Sister   . Colon cancer Sister        mets from breast  . Hypertension Brother   . Heart disease Father     Social History Social History   Tobacco Use  . Smoking status: Never Smoker  . Smokeless tobacco: Never Used  Substance Use Topics  . Alcohol use: Never    Frequency: Never  . Drug use: No     Allergies   Fergon [ferrous gluconate]; Ampicillin; Cephalexin; Codeine; Famotidine; Nizatidine; Tagamet [cimetidine]; and Hyman Hopes allergy]   Review of Systems Review of Systems  Constitutional: Negative.   Musculoskeletal: Negative.   Skin: Positive for wound.  Neurological: Positive for weakness. Negative for dizziness, tremors, seizures, syncope, facial asymmetry, light-headedness, numbness and headaches.  All other systems reviewed and are negative.    Physical Exam Triage  Vital Signs ED Triage Vitals  Enc Vitals Group     BP      Pulse      Resp      Temp      Temp src      SpO2      Weight      Height      Head Circumference      Peak Flow      Pain Score      Pain Loc      Pain Edu?      Excl. in [place]?    No data found.  Updated Vital Signs There were no vitals taken for this visit.   Physical Exam Vitals signs and nursing note reviewed.  Constitutional:      General: She is not in acute distress.    Appearance: Normal appearance. She is normal weight.  HENT:     Head:      Comments: 3 cm gaping occipital wound on the right    Right Ear: Tympanic membrane, ear canal and external ear normal.     Left Ear: Tympanic  membrane, ear canal and external ear normal.     Nose: Nose normal.     Mouth/Throat:     Mouth: Mucous membranes are moist.     Pharynx: Oropharynx is clear.  Eyes:     Conjunctiva/sclera: Conjunctivae normal.     Pupils: Pupils are equal, round, and reactive to light.  Neck:     Musculoskeletal: Normal range of motion and neck supple.  Pulmonary:     Effort: Pulmonary effort is normal.  Musculoskeletal:     Comments: Festinating gait No tremor  Skin:    General: Skin is warm and dry.     Comments: 1-1/2 cm laceration over right proximal ulna  Neurological:     General: No focal deficit present.     Mental Status: She is alert and oriented to person, place, and time. Mental status is at baseline.     Comments: Festinating gait which is not changed according to her husband. Patient showing no change in mentation.  She has no loss of memory.  She does have trouble with calculations chronically.  Psychiatric:        Mood and Affect: Mood normal.      UC Treatments / Results  Labs (all labs ordered are listed, but only abnormal results are displayed) Labs Reviewed - No data to display  EKG None  Radiology No results found.  Procedures Laceration Repair Date/Time: 01/21/2018 1:03 PM Performed by: Robyn Haber, MD Authorized by: Robyn Haber, MD   Consent:    Consent obtained:  Verbal   Consent given by:  Patient and spouse   Risks discussed:  Pain   Alternatives discussed:  No treatment Anesthesia (see MAR for exact dosages):    Anesthesia method:  Local infiltration   Local anesthetic:  Lidocaine 2% WITH epi Laceration details:    Location:  Scalp   Scalp location:  Occipital   Length (cm):  3   Depth (mm):  0.8 Repair type:    Repair type:  Simple Pre-procedure details:    Preparation:  Patient was prepped and draped in usual sterile fashion Exploration:    Contaminated: no   Treatment:    Area cleansed with:  Shur-Clens   Amount of cleaning:   Standard   Visualized foreign bodies/material removed: no   Skin repair:    Repair method:  Sutures   Suture  size:  4-0   Suture technique:  Simple interrupted   Number of sutures:  5 Approximation:    Approximation:  Close Post-procedure details:    Dressing:  Open (no dressing)   Patient tolerance of procedure:  Tolerated well, no immediate complications Comments:     2 horizontal mattresses of 4-0 Ethilon were used to close the right arm laceration and a sterile dressing was applied.   (including critical care time)  Medications Ordered in UC Medications - No data to display  Initial Impression / Assessment and Plan / UC Course  I have reviewed the triage vital signs and the nursing notes.  Pertinent labs & imaging results that were available during my care of the patient were reviewed by me and considered in my medical decision making (see chart for details).    Final Clinical Impressions(s) / UC Diagnoses   Final diagnoses:  None   Discharge Instructions   None    ED Prescriptions    None     Controlled Substance Prescriptions [place] Controlled Substance Registry consulted? Not Applicable   Robyn Haber, MD 01/21/18 1306

## 2018-01-21 NOTE — ED Triage Notes (Signed)
Pt was getting out of bed and fell this morning.  She has suffered a laceration to the back of her head and to her right forearm.  Pt denies LOC.  Her last fall was in February.

## 2018-02-11 NOTE — Progress Notes (Signed)
[name]    HEMATOLOGY/ONCOLOGY FOLLOW UP NOTE  Date of Service: 02/12/2018   Patient Care Team: [name], MD as PCP - General (Internal Medicine)in [place]  CHIEF COMPLAINTS/PURPOSE OF CONSULTATION:  Follow up for iron deficiency anemia  Diagnosis: 1) Iron deficiency anemia due to chronic GI blood loss requiring intermitent IV iron infusions. Last received this on 03/28/2015 and 04/04/2015. Changed to IV Injectafer on 04/11/16 as needed.  2) B12 deficiency on po B complex replacement.  Treatment Prn IV Iron  INTERVAL HISTORY   Ms [name] [name] is here for management and evaluation of her iron deficiency anemia. The patient's last visit with [place] was on 08/12/17. She is accompanied today by her husband. The pt reports that she is doing well overall.   The pt reports that she has not developed any new concerns since our last visit. She denies blood in the stools or black stools. She notes that she is eating very well, and has gained a few pounds. The pt denies any abdominal pain or leg swelling.   Lab results today (02/12/18) of CBC w/diff and CMP is as follows: all values are WNL except for Glucose at 149, Calcium at 8.8. 02/12/18 Ferritin is pending 02/12/18 Iron and TIBC is pending  On review of systems, pt reports stable energy levels, eating well, weight gain, and denies blood in the stools, black stools, abdominal pain, leg swelling, and any other symptoms.   MEDICAL HISTORY:  Past Medical History:  Diagnosis Date  . Anemia    iron def  . B12 deficiency 08/10/2010  . Cataract    bilateral  . Esophageal stricture 09/05/2016  . Gait disorder 04/13/2014  . GERD with stricture   . Hypertension   . Iron deficiency anemia 08/10/2010  . Irritable bowel syndrome   . Kidney stones   . Thyroid disease    hypothyroid  Partial gastrectomy (for bleeding stomach ulcer 11 units)  SURGICAL HISTORY: Past Surgical History:  Procedure Laterality Date  . ABDOMINAL HYSTERECTOMY  1975  . APPENDECTOMY   1955  . CHOLECYSTECTOMY  1978  . COLONOSCOPY    . ESOPHAGEAL DILATION  multiple  . VAGOTOMY AND PYLOROPLASTY  1971    SOCIAL HISTORY: Social History   Socioeconomic History  . Marital status: Married    Spouse name: Wilsie Kern  . Number of children: 2  . Years of education: 52  . Highest education level: Not on file  Occupational History  . Occupation: retired  Scientific laboratory technician  . Financial resource strain: Not on file  . Food insecurity:    Worry: Not on file    Inability: Not on file  . Transportation needs:    Medical: Not on file    Non-medical: Not on file  Tobacco Use  . Smoking status: Never Smoker  . Smokeless tobacco: Never Used  Substance and Sexual Activity  . Alcohol use: Never    Frequency: Never  . Drug use: No  . Sexual activity: Not on file  Lifestyle  . Physical activity:    Days per week: Not on file    Minutes per session: Not on file  . Stress: Not on file  Relationships  . Social connections:    Talks on phone: Not on file    Gets together: Not on file    Attends religious service: Not on file    Active member of club or organization: Not on file    Attends meetings of clubs or organizations: Not on file  Relationship status: Not on file  . Intimate partner violence:    Fear of current or ex partner: Not on file    Emotionally abused: Not on file    Physically abused: Not on file    Forced sexual activity: Not on file  Other Topics Concern  . Not on file  Social History Narrative   Patient is right handed.   Patient drinks 1 cup of caffeine per day.    FAMILY HISTORY: Family History  Problem Relation Age of Onset  . Breast cancer Sister   . Colon cancer Sister        mets from breast  . Hypertension Brother   . Heart disease Father     ALLERGIES:  is allergic to fergon [ferrous gluconate]; ampicillin; cephalexin; codeine; famotidine; nizatidine; tagamet [cimetidine]; and salmon [fish allergy].  MEDICATIONS:  Current  Outpatient Medications  Medication Sig Dispense Refill  . amLODipine (NORVASC) 5 MG tablet Take 5 mg by mouth daily.    [name] MYRBETRIQ 50 MG TB24 tablet Take 50 mg by mouth daily.  11   No current facility-administered medications for this visit.     REVIEW OF SYSTEMS:    A 10+ POINT REVIEW OF SYSTEMS WAS OBTAINED including neurology, dermatology, psychiatry, cardiac, respiratory, lymph, extremities, GI, GU, Musculoskeletal, constitutional, breasts, reproductive, HEENT.  All pertinent positives are noted in the HPI.  All others are negative.   PHYSICAL EXAMINATION: ECOG PERFORMANCE STATUS: 2 - Symptomatic, <50% confined to bed  . Vitals:   02/12/18 0944  BP: (!) 153/65  Pulse: 85  Resp: 18  Temp: 97.8 F (36.6 C)  SpO2: 100%   Filed Weights   02/12/18 0944  Weight: 107 lb 6.4 oz (48.7 kg)   .Body mass index is 17.87 kg/m.  GENERAL:alert, in no acute distress and comfortable SKIN: no acute rashes, no significant lesions EYES: conjunctiva are pink and non-injected, sclera anicteric OROPHARYNX: MMM, no exudates, no oropharyngeal erythema or ulceration NECK: supple, no JVD LYMPH:  no palpable lymphadenopathy in the cervical, axillary or inguinal regions LUNGS: clear to auscultation b/l with normal respiratory effort HEART: regular rate & rhythm ABDOMEN:  normoactive bowel sounds , non tender, not distended. No palpable hepatosplenomegaly.  Extremity: no pedal edema PSYCH: alert & oriented x 3 with fluent speech NEURO: no focal motor/sensory deficits   LABORATORY DATA:  I have reviewed the data as listed  . CBC Latest Ref Rng & Units 02/12/2018 08/12/2017 05/13/2017  WBC 4.0 - 10.5 K/uL 6.9 6.0 7.8  Hemoglobin 12.0 - 15.0 g/dL 12.5 12.6 13.1  Hematocrit 36.0 - 46.0 % 39.7 38.3 40.0  Platelets 150 - 400 K/uL 212 219 238   . CBC    Component Value Date/Time   WBC 6.9 02/12/2018 0926   RBC 3.99 02/12/2018 0926   HGB 12.5 02/12/2018 0926   HGB 12.6 08/12/2017 1351    HGB 13.0 10/27/2016 1443   HCT 39.7 02/12/2018 0926   HCT 40.4 10/27/2016 1443   PLT 212 02/12/2018 0926   PLT 219 08/12/2017 1351   PLT 305 10/27/2016 1443   MCV 99.5 02/12/2018 0926   MCV 98.1 10/27/2016 1443   MCH 31.3 02/12/2018 0926   MCHC 31.5 02/12/2018 0926   RDW 11.9 02/12/2018 0926   RDW 12.6 10/27/2016 1443   LYMPHSABS 1.3 02/12/2018 0926   LYMPHSABS 1.9 10/27/2016 1443   MONOABS 0.4 02/12/2018 0926   MONOABS 0.6 10/27/2016 1443   EOSABS 0.5 02/12/2018 0926   EOSABS 1.2 (H)  10/27/2016 1443   BASOSABS 0.1 02/12/2018 0926   BASOSABS 0.1 10/27/2016 1443    . CMP Latest Ref Rng & Units 02/12/2018 02/13/2017 10/27/2016  Glucose 70 - 99 mg/dL 149(H) 111 105  BUN 8 - 23 mg/dL 18 19 20.5  Creatinine 0.44 - 1.00 mg/dL 0.77 0.84 0.8  Sodium 135 - 145 mmol/L 140 141 140  Potassium 3.5 - 5.1 mmol/L 4.1 4.3 4.0  Chloride 98 - 111 mmol/L 107 107 -  CO2 22 - 32 mmol/L 23 28 27   Calcium 8.9 - 10.3 mg/dL 8.8(L) 8.8 9.1  Total Protein 6.5 - 8.1 g/dL 6.6 6.2(L) 6.6  Total Bilirubin 0.3 - 1.2 mg/dL 0.9 0.8 0.47  Alkaline Phos 38 - 126 U/L 62 107 89  AST 15 - 41 U/L 16 21 19   ALT 0 - 44 U/L 13 24 24    . Lab Results  Component Value Date   IRON 77 08/12/2017   TIBC 293 08/12/2017   IRONPCTSAT 26 08/12/2017   (Iron and TIBC)  Lab Results  Component Value Date   FERRITIN 105 08/12/2017   B12 - 503 RADIOGRAPHIC STUDIES: I have personally reviewed the radiological images as listed and agreed with the findings in the report. No results found.  ASSESSMENT & PLAN:   85 y.o. caucasian female with   1) Chronic iron deficiency anemia due to chronic GI blood loss and likely issues with absorption related to partial gastrectomy. Also patient has had intolerance to po iron.  2) h/o B12 deficiency - lower level at 226 Plan:  -Discussed pt labwork today, 02/12/18; blood counts normal including HGB at 12.5, chemistries are stable. -02/12/18 Ferritin is .adequate - no IV iron  indicated at this time. Lab Results  Component Value Date   IRON 75 02/12/2018   TIBC 215 (L) 02/12/2018   IRONPCTSAT 35 02/12/2018   (Iron and TIBC)  Lab Results  Component Value Date   FERRITIN 617 (H) 02/12/2018   -Goal for Ferritin to be >100- and is currently at goals -Continue B complex and add PO B12 1000 mcg SL daily -Will see the pt back in 6 months   RTC with dr Irene Limbo with labs in 6 months    All of the patients questions were answered with apparent satisfaction. The patient knows to call the clinic with any problems, questions or concerns.  The total time spent in the appt was 20 minutes and more than 50% was on counseling and direct patient cares.    Sullivan Lone MD [place] AAHIVMS [facility] Hematology/Oncology Physician [facility]  (Office):       [phone] (Work cell):  [phone] (Fax):           [phone]  I, Baldwin [place], am acting as a scribe for Dr. Sullivan Lone.   .I have reviewed the above documentation for accuracy and completeness, and I agree with the above. Brunetta Genera MD

## 2018-02-12 ENCOUNTER — Inpatient Hospital Stay: Payer: PPO | Attending: Hematology

## 2018-02-12 ENCOUNTER — Telehealth: Payer: Self-pay

## 2018-02-12 ENCOUNTER — Inpatient Hospital Stay (HOSPITAL_BASED_OUTPATIENT_CLINIC_OR_DEPARTMENT_OTHER): Payer: PPO | Admitting: Hematology

## 2018-02-12 VITALS — BP 153/65 | HR 85 | Temp 97.8°F | Resp 18 | Ht 65.0 in | Wt 107.4 lb

## 2018-02-12 DIAGNOSIS — E039 Hypothyroidism, unspecified: Secondary | ICD-10-CM | POA: Diagnosis not present

## 2018-02-12 DIAGNOSIS — K222 Esophageal obstruction: Secondary | ICD-10-CM | POA: Diagnosis not present

## 2018-02-12 DIAGNOSIS — K589 Irritable bowel syndrome without diarrhea: Secondary | ICD-10-CM | POA: Insufficient documentation

## 2018-02-12 DIAGNOSIS — I10 Essential (primary) hypertension: Secondary | ICD-10-CM | POA: Diagnosis not present

## 2018-02-12 DIAGNOSIS — K219 Gastro-esophageal reflux disease without esophagitis: Secondary | ICD-10-CM

## 2018-02-12 DIAGNOSIS — Z803 Family history of malignant neoplasm of breast: Secondary | ICD-10-CM | POA: Diagnosis not present

## 2018-02-12 DIAGNOSIS — D500 Iron deficiency anemia secondary to blood loss (chronic): Secondary | ICD-10-CM | POA: Diagnosis not present

## 2018-02-12 DIAGNOSIS — Z79899 Other long term (current) drug therapy: Secondary | ICD-10-CM | POA: Insufficient documentation

## 2018-02-12 DIAGNOSIS — Z800 Family history of malignant neoplasm of digestive organs: Secondary | ICD-10-CM | POA: Diagnosis not present

## 2018-02-12 DIAGNOSIS — R635 Abnormal weight gain: Secondary | ICD-10-CM | POA: Insufficient documentation

## 2018-02-12 DIAGNOSIS — Z87442 Personal history of urinary calculi: Secondary | ICD-10-CM | POA: Insufficient documentation

## 2018-02-12 DIAGNOSIS — E538 Deficiency of other specified B group vitamins: Secondary | ICD-10-CM | POA: Diagnosis not present

## 2018-02-12 LAB — CBC WITH DIFFERENTIAL/PLATELET
ABS IMMATURE GRANULOCYTES: 0.01 10*3/uL (ref 0.00–0.07)
Basophils Absolute: 0.1 10*3/uL (ref 0.0–0.1)
Basophils Relative: 1 %
EOS PCT: 7 %
Eosinophils Absolute: 0.5 10*3/uL (ref 0.0–0.5)
HEMATOCRIT: 39.7 % (ref 36.0–46.0)
HEMOGLOBIN: 12.5 g/dL (ref 12.0–15.0)
Immature Granulocytes: 0 %
LYMPHS ABS: 1.3 10*3/uL (ref 0.7–4.0)
LYMPHS PCT: 19 %
MCH: 31.3 pg (ref 26.0–34.0)
MCHC: 31.5 g/dL (ref 30.0–36.0)
MCV: 99.5 fL (ref 80.0–100.0)
MONO ABS: 0.4 10*3/uL (ref 0.1–1.0)
MONOS PCT: 6 %
NEUTROS ABS: 4.6 10*3/uL (ref 1.7–7.7)
Neutrophils Relative %: 67 %
Platelets: 212 10*3/uL (ref 150–400)
RBC: 3.99 MIL/uL (ref 3.87–5.11)
RDW: 11.9 % (ref 11.5–15.5)
WBC: 6.9 10*3/uL (ref 4.0–10.5)
nRBC: 0 % (ref 0.0–0.2)

## 2018-02-12 LAB — CMP (CANCER CENTER ONLY)
ALT: 13 U/L (ref 0–44)
AST: 16 U/L (ref 15–41)
Albumin: 3.6 g/dL (ref 3.5–5.0)
Alkaline Phosphatase: 62 U/L (ref 38–126)
Anion gap: 10 (ref 5–15)
BUN: 18 mg/dL (ref 8–23)
CO2: 23 mmol/L (ref 22–32)
Calcium: 8.8 mg/dL — ABNORMAL LOW (ref 8.9–10.3)
Chloride: 107 mmol/L (ref 98–111)
Creatinine: 0.77 mg/dL (ref 0.44–1.00)
GFR, Est AFR Am: >60 mL/min (ref >60)
GFR, Estimated: >60 mL/min (ref >60)
Glucose, Bld: 149 mg/dL — ABNORMAL HIGH (ref 70–99)
Potassium: 4.1 mmol/L (ref 3.5–5.1)
Sodium: 140 mmol/L (ref 135–145)
Total Bilirubin: 0.9 mg/dL (ref 0.3–1.2)
Total Protein: 6.6 g/dL (ref 6.5–8.1)

## 2018-02-12 LAB — IRON AND TIBC
Iron: 75 ug/dL (ref 41–142)
Saturation Ratios: 35 % (ref 21–57)
TIBC: 215 ug/dL — ABNORMAL LOW (ref 236–444)
UIBC: 140 ug/dL (ref 120–384)

## 2018-02-12 LAB — VITAMIN B12: Vitamin B-12: 226 pg/mL (ref 180–914)

## 2018-02-12 LAB — FERRITIN: Ferritin: 617 ng/mL — ABNORMAL HIGH (ref 11–307)

## 2018-02-12 NOTE — Telephone Encounter (Signed)
Printed avs and calender of upcoming appointment. Per 1/10 los 

## 2018-02-12 NOTE — Patient Instructions (Signed)
Thank you for choosing [facility] to provide your oncology and hematology care.  To afford each patient quality time with our providers, please arrive 30 minutes before your scheduled appointment time.  If you arrive late for your appointment, you may be asked to reschedule.  We strive to give you quality time with our providers, and arriving late affects you and other patients whose appointments are after yours.    If you are a no show for multiple scheduled visits, you may be dismissed from the clinic at the providers discretion.     Again, thank you for choosing [facility], our hope is that these requests will decrease the amount of time that you wait before being seen by our physicians.  ______________________________________________________________________   Should you have questions after your visit to the  Cancer Center, please contact our office at [phone] between the hours of 8:30 and 4:30 p.m.    Voicemails left after 4:30p.m will not be returned until the following business day.     For prescription refill requests, please have your pharmacy contact us directly.  Please also try to allow 48 hours for prescription requests.     Please contact the scheduling department for questions regarding scheduling.  For scheduling of procedures such as PET scans, CT scans, MRI, Ultrasound, etc please contact central scheduling at [phone].     Resources For Cancer Patients and Caregivers:    Oncolink.org:  A wonderful resource for patients and healthcare providers for information regarding your disease, ways to tract your treatment, what to expect, etc.      American Cancer Society:  800-227-2345  Can help patients locate various types of support and financial assistance   Cancer Care: 1-800-813-HOPE (4673) Provides financial assistance, online support groups, medication/co-pay assistance.     [place] DSS:  336-641-3447 Where to apply  for food stamps, Medicaid, and utility assistance   Medicare Rights Center: 800-333-4114 Helps people with Medicare understand their rights and benefits, navigate the Medicare system, and secure the quality healthcare they deserve   SCAT: 336-333-6589 [place] [employer]'s shared-ride transportation service for eligible riders who have a disability that prevents them from riding the fixed route bus.     For additional information on assistance programs please contact our social worker:   Abigail Elmore:  [phone]  

## 2018-02-15 ENCOUNTER — Telehealth: Payer: Self-pay

## 2018-02-15 ENCOUNTER — Telehealth: Payer: Self-pay | Admitting: *Deleted

## 2018-02-15 NOTE — Telephone Encounter (Signed)
Contacted patient to follow on on lab work and OTC medication recommendations. Did not leave a message per the medical release so left message to call back for lab results and recommendations per Dr. Irene Limbo.

## 2018-02-15 NOTE — Telephone Encounter (Signed)
Patient called back after receiving message from office she gave phone to husband and asked that I speak to him.  Per Dr. Irene Limbo: Ferritin is >100, no need for IV iron. Patient should take once per day- 1000 mcg B 12 SL. Patient's husband took down information and verbalized understanding regarding medication for patient to take.

## 2018-04-15 DIAGNOSIS — R350 Frequency of micturition: Secondary | ICD-10-CM | POA: Diagnosis not present

## 2018-04-15 DIAGNOSIS — N3946 Mixed incontinence: Secondary | ICD-10-CM | POA: Diagnosis not present

## 2018-04-21 ENCOUNTER — Ambulatory Visit (INDEPENDENT_AMBULATORY_CARE_PROVIDER_SITE_OTHER): Payer: Self-pay | Admitting: Physical Medicine and Rehabilitation

## 2018-05-20 ENCOUNTER — Ambulatory Visit (INDEPENDENT_AMBULATORY_CARE_PROVIDER_SITE_OTHER): Payer: Self-pay | Admitting: Physical Medicine and Rehabilitation

## 2018-06-29 ENCOUNTER — Ambulatory Visit: Payer: Self-pay | Admitting: Physical Medicine and Rehabilitation

## 2018-08-12 NOTE — Progress Notes (Signed)
[name] [name]    HEMATOLOGY/ONCOLOGY FOLLOW UP NOTE  Date of Service: 08/13/2018   Patient Care Team: [name] Dy, [place] as PCP - General ([place] Medicine)[place]  CHIEF COMPLAINTS/PURPOSE OF CONSULTATION:  Follow up for iron deficiency anemia  Diagnosis: 1) Iron deficiency anemia due to chronic GI blood loss requiring intermitent IV iron infusions. Last received this on 03/28/2015 and 04/04/2015. Changed to IV Injectafer on 04/11/16 as needed.  2) B12 deficiency on po B complex replacement.  Treatment Prn IV Iron  INTERVAL HISTORY   [name] is here for management and evaluation of her iron deficiency anemia. The patient's last visit with [place] was on 02/12/18. The pt reports that she is doing well overall. Her husband accompanied her over the phone.  The pt reports no new concerns. Denies concerns for bleeding, belly pain, and black stools. She does not take any iron pills, but does take a B12 complex. Her last IV iron replacement was in 08/2017.  Lab results today (08/13/18) of CBC w/diff and CMP is as follows: all values are WNL except for glucose bld at 105.  08/13/2018 Ferritin at 814 08/13/2018 TIBC at 219 On review of systems, pt reports that she is doing well and denies bleeding, belly pain, and bloody stools and any other symptoms.    MEDICAL HISTORY:  Past Medical History:  Diagnosis Date  . Anemia    iron def  . B12 deficiency 08/10/2010  . Cataract    bilateral  . Esophageal stricture 09/05/2016  . Gait disorder 04/13/2014  . GERD with stricture   . Hypertension   . Iron deficiency anemia 08/10/2010  . Irritable bowel syndrome   . Kidney stones   . Thyroid disease    hypothyroid  Partial gastrectomy (for bleeding stomach ulcer 11 units)  SURGICAL HISTORY: Past Surgical History:  Procedure Laterality Date  . ABDOMINAL HYSTERECTOMY  1975  . APPENDECTOMY  1955  . CHOLECYSTECTOMY  1978  . COLONOSCOPY    . ESOPHAGEAL DILATION  multiple  . VAGOTOMY AND PYLOROPLASTY  1971     SOCIAL HISTORY: Social History   Socioeconomic History  . Marital status: Married    Spouse name: [name] [name]  . Number of children: 2  . Years of education: 31  . Highest education level: Not on file  Occupational History  . Occupation: retired  Scientific laboratory technician  . Financial resource strain: Not on file  . Food insecurity    Worry: Not on file    Inability: Not on file  . Transportation needs    Medical: Not on file    Non-medical: Not on file  Tobacco Use  . Smoking status: Never Smoker  . Smokeless tobacco: Never Used  Substance and Sexual Activity  . Alcohol use: Never    Frequency: Never  . Drug use: No  . Sexual activity: Not on file  Lifestyle  . Physical activity    Days per week: Not on file    Minutes per session: Not on file  . Stress: Not on file  Relationships  . Social Herbalist on phone: Not on file    Gets together: Not on file    Attends religious service: Not on file    Active member of club or organization: Not on file    Attends meetings of clubs or organizations: Not on file    Relationship status: Not on file  . Intimate partner violence    Fear of current or ex partner: Not  on file    Emotionally abused: Not on file    Physically abused: Not on file    Forced sexual activity: Not on file  Other Topics Concern  . Not on file  Social History Narrative   Patient is right handed.   Patient drinks 1 cup of caffeine per day.    FAMILY HISTORY: Family History  Problem Relation Age of Onset  . Breast cancer Sister   . Colon cancer Sister        mets from breast  . Hypertension Brother   . Heart disease Father     ALLERGIES:  is allergic to fergon [ferrous gluconate]; ampicillin; cephalexin; codeine; famotidine; nizatidine; tagamet [cimetidine]; and salmon [fish allergy].  MEDICATIONS:  Current Outpatient Medications  Medication Sig Dispense Refill  . amLODipine (NORVASC) 5 MG tablet Take 5 mg by mouth daily.    [name] [name]  levothyroxine (SYNTHROID, LEVOTHROID) 200 MCG tablet Take 200 mcg by mouth daily before breakfast.    . MYRBETRIQ 50 MG TB24 tablet Take 50 mg by mouth daily.  11   No current facility-administered medications for this visit.     REVIEW OF SYSTEMS:    A 10+ POINT REVIEW OF SYSTEMS WAS OBTAINED including neurology, dermatology, psychiatry, cardiac, respiratory, lymph, extremities, GI, GU, Musculoskeletal, constitutional, breasts, reproductive, HEENT.  All pertinent positives are noted in the HPI.  All others are negative.   PHYSICAL EXAMINATION: ECOG PERFORMANCE STATUS: 2 - Symptomatic, <50% confined to bed  . Vitals:   08/13/18 1214  BP: (!) 180/97  Pulse: 85  Resp: 18  Temp: 98.9 F (37.2 C)  SpO2: 99%   Filed Weights   08/13/18 1214  Weight: 103 lb 6.4 oz (46.9 kg)   .Body mass index is 17.21 kg/m.  GENERAL:alert, in no acute distress and comfortable. Pt presents in a wheelchair SKIN: no acute rashes, no significant lesions EYES: conjunctiva are pink and non-injected, sclera anicteric OROPHARYNX: MMM, no exudates, no oropharyngeal erythema or ulceration NECK: supple, no JVD LYMPH:  no palpable lymphadenopathy in the cervical, axillary or inguinal regions LUNGS: clear to auscultation b/l with normal respiratory effort HEART: regular rate & rhythm ABDOMEN:  normoactive bowel sounds , non tender, not distended. No palpable hepatosplenomegaly.  Extremity: no pedal edema PSYCH: alert & oriented x 3 with fluent speech NEURO: no focal motor/sensory deficits   LABORATORY DATA:  I have reviewed the data as listed  . CBC Latest Ref Rng & Units 08/13/2018 02/12/2018 08/12/2017  WBC 4.0 - 10.5 K/uL 5.6 6.9 6.0  Hemoglobin 12.0 - 15.0 g/dL 12.8 12.5 12.6  Hematocrit 36.0 - 46.0 % 38.6 39.7 38.3  Platelets 150 - 400 K/uL 202 212 219   . CBC    Component Value Date/Time   WBC 5.6 08/13/2018 1054   RBC 4.16 08/13/2018 1054   HGB 12.8 08/13/2018 1054   HGB 12.6 08/12/2017  1351   HGB 13.0 10/27/2016 1443   HCT 38.6 08/13/2018 1054   HCT 40.4 10/27/2016 1443   PLT 202 08/13/2018 1054   PLT 219 08/12/2017 1351   PLT 305 10/27/2016 1443   MCV 92.8 08/13/2018 1054   MCV 98.1 10/27/2016 1443   MCH 30.8 08/13/2018 1054   MCHC 33.2 08/13/2018 1054   RDW 12.4 08/13/2018 1054   RDW 12.6 10/27/2016 1443   LYMPHSABS 1.5 08/13/2018 1054   LYMPHSABS 1.9 10/27/2016 1443   MONOABS 0.5 08/13/2018 1054   MONOABS 0.6 10/27/2016 1443   EOSABS 0.3 08/13/2018 1054  EOSABS 1.2 (H) 10/27/2016 1443   BASOSABS 0.0 08/13/2018 1054   BASOSABS 0.1 10/27/2016 1443    . CMP Latest Ref Rng & Units 08/13/2018 02/12/2018 02/13/2017  Glucose 70 - 99 mg/dL 105(H) 149(H) 111  BUN 8 - 23 mg/dL 17 18 19   Creatinine 0.44 - 1.00 mg/dL 0.78 0.77 0.84  Sodium 135 - 145 mmol/L 141 140 141  Potassium 3.5 - 5.1 mmol/L 4.4 4.1 4.3  Chloride 98 - 111 mmol/L 108 107 107  CO2 22 - 32 mmol/L 26 23 28   Calcium 8.9 - 10.3 mg/dL 9.1 8.8(L) 8.8  Total Protein 6.5 - 8.1 g/dL 7.2 6.6 6.2(L)  Total Bilirubin 0.3 - 1.2 mg/dL 0.9 0.9 0.8  Alkaline Phos 38 - 126 U/L 70 62 107  AST 15 - 41 U/L 18 16 21   ALT 0 - 44 U/L 13 13 24    . Lab Results  Component Value Date   IRON 80 08/13/2018   TIBC 219 (L) 08/13/2018   IRONPCTSAT 36 08/13/2018   (Iron and TIBC)  Lab Results  Component Value Date   FERRITIN 814 (H) 08/13/2018   B12 - 503 RADIOGRAPHIC STUDIES: I have personally reviewed the radiological images as listed and agreed with the findings in the report. No results found.  ASSESSMENT & PLAN:   85 y.o. caucasian female with   1) Chronic iron deficiency anemia due to chronic GI blood loss and likely issues with absorption related to partial gastrectomy. Also patient has had intolerance to po iron.  2) h/o B12 deficiency - lower level at 226 Recheck B12 level on next visit. Plan:   -Discussed pt labwork today, 08/13/18; ferratin levels are at 814, blood counts and blood chemistries  are normal - No indication for IV or PO iron at this time - Will continue to monitor iron levels  Lab Results  Component Value Date   IRON 80 08/13/2018   TIBC 219 (L) 08/13/2018   IRONPCTSAT 36 08/13/2018    Lab Results  Component Value Date   FERRITIN 814 (H) 08/13/2018   -Goal for Ferritin to be >100- and is currently at goals -Continue B complex and add PO B12 1000 mcg SL daily -Will see the pt back in 6 months   RTC with Dr [name] [name] with labs in 6 months   All of the patients questions were answered with apparent satisfaction. The patient knows to call the clinic with any problems, questions or concerns.  The total time spent in the appt was 15 minutes and more than 50% was on counseling and direct patient cares.    [name] Lone MD [place] AAHIVMS [facility] Hematology/Oncology Physician [facility]  (Office):       [phone] (Work cell):  [phone] (Fax):           [phone]  I, [name] [name], am acting as a scribe for Dr. Sullivan [name].   .I have reviewed the above documentation for accuracy and completeness, and I agree with the above. [name] Genera MD

## 2018-08-13 ENCOUNTER — Encounter (INDEPENDENT_AMBULATORY_CARE_PROVIDER_SITE_OTHER): Payer: Self-pay

## 2018-08-13 ENCOUNTER — Inpatient Hospital Stay: Payer: PPO | Attending: Hematology | Admitting: Hematology

## 2018-08-13 ENCOUNTER — Other Ambulatory Visit: Payer: Self-pay

## 2018-08-13 ENCOUNTER — Inpatient Hospital Stay: Payer: PPO

## 2018-08-13 ENCOUNTER — Telehealth: Payer: Self-pay | Admitting: Hematology

## 2018-08-13 VITALS — BP 180/97 | HR 85 | Temp 98.9°F | Resp 18 | Ht 65.0 in | Wt 103.4 lb

## 2018-08-13 DIAGNOSIS — Z79899 Other long term (current) drug therapy: Secondary | ICD-10-CM | POA: Insufficient documentation

## 2018-08-13 DIAGNOSIS — Z800 Family history of malignant neoplasm of digestive organs: Secondary | ICD-10-CM | POA: Diagnosis not present

## 2018-08-13 DIAGNOSIS — E538 Deficiency of other specified B group vitamins: Secondary | ICD-10-CM | POA: Diagnosis not present

## 2018-08-13 DIAGNOSIS — Z803 Family history of malignant neoplasm of breast: Secondary | ICD-10-CM | POA: Diagnosis not present

## 2018-08-13 DIAGNOSIS — Z87442 Personal history of urinary calculi: Secondary | ICD-10-CM | POA: Insufficient documentation

## 2018-08-13 DIAGNOSIS — E039 Hypothyroidism, unspecified: Secondary | ICD-10-CM | POA: Diagnosis not present

## 2018-08-13 DIAGNOSIS — K219 Gastro-esophageal reflux disease without esophagitis: Secondary | ICD-10-CM | POA: Diagnosis not present

## 2018-08-13 DIAGNOSIS — I10 Essential (primary) hypertension: Secondary | ICD-10-CM | POA: Insufficient documentation

## 2018-08-13 DIAGNOSIS — D500 Iron deficiency anemia secondary to blood loss (chronic): Secondary | ICD-10-CM | POA: Diagnosis not present

## 2018-08-13 DIAGNOSIS — K589 Irritable bowel syndrome without diarrhea: Secondary | ICD-10-CM | POA: Diagnosis not present

## 2018-08-13 LAB — CBC WITH DIFFERENTIAL/PLATELET
Abs Immature Granulocytes: 0.02 10*3/uL (ref 0.00–0.07)
Basophils Absolute: 0 10*3/uL (ref 0.0–0.1)
Basophils Relative: 1 %
Eosinophils Absolute: 0.3 10*3/uL (ref 0.0–0.5)
Eosinophils Relative: 6 %
HCT: 38.6 % (ref 36.0–46.0)
Hemoglobin: 12.8 g/dL (ref 12.0–15.0)
Immature Granulocytes: 0 %
Lymphocytes Relative: 27 %
Lymphs Abs: 1.5 10*3/uL (ref 0.7–4.0)
MCH: 30.8 pg (ref 26.0–34.0)
MCHC: 33.2 g/dL (ref 30.0–36.0)
MCV: 92.8 fL (ref 80.0–100.0)
Monocytes Absolute: 0.5 10*3/uL (ref 0.1–1.0)
Monocytes Relative: 9 %
Neutro Abs: 3.1 10*3/uL (ref 1.7–7.7)
Neutrophils Relative %: 57 %
Platelets: 202 10*3/uL (ref 150–400)
RBC: 4.16 MIL/uL (ref 3.87–5.11)
RDW: 12.4 % (ref 11.5–15.5)
WBC: 5.6 10*3/uL (ref 4.0–10.5)
nRBC: 0 % (ref 0.0–0.2)

## 2018-08-13 LAB — CMP (CANCER CENTER ONLY)
ALT: 13 U/L (ref 0–44)
AST: 18 U/L (ref 15–41)
Albumin: 3.7 g/dL (ref 3.5–5.0)
Alkaline Phosphatase: 70 U/L (ref 38–126)
Anion gap: 7 (ref 5–15)
BUN: 17 mg/dL (ref 8–23)
CO2: 26 mmol/L (ref 22–32)
Calcium: 9.1 mg/dL (ref 8.9–10.3)
Chloride: 108 mmol/L (ref 98–111)
Creatinine: 0.78 mg/dL (ref 0.44–1.00)
GFR, Est AFR Am: >60 mL/min (ref >60)
GFR, Estimated: >60 mL/min (ref >60)
Glucose, Bld: 105 mg/dL — ABNORMAL HIGH (ref 70–99)
Potassium: 4.4 mmol/L (ref 3.5–5.1)
Sodium: 141 mmol/L (ref 135–145)
Total Bilirubin: 0.9 mg/dL (ref 0.3–1.2)
Total Protein: 7.2 g/dL (ref 6.5–8.1)

## 2018-08-13 LAB — FERRITIN: Ferritin: 814 ng/mL — ABNORMAL HIGH (ref 11–307)

## 2018-08-13 LAB — IRON AND TIBC
Iron: 80 ug/dL (ref 41–142)
Saturation Ratios: 36 % (ref 21–57)
TIBC: 219 ug/dL — ABNORMAL LOW (ref 236–444)
UIBC: 139 ug/dL (ref 120–384)

## 2018-08-13 NOTE — Telephone Encounter (Signed)
Scheduled appt per 7/10 los.  Spoke with patient and she is aware of her appt date and time.

## 2018-12-08 DIAGNOSIS — Z1389 Encounter for screening for other disorder: Secondary | ICD-10-CM | POA: Diagnosis not present

## 2018-12-08 DIAGNOSIS — Z1331 Encounter for screening for depression: Secondary | ICD-10-CM | POA: Diagnosis not present

## 2018-12-08 DIAGNOSIS — E785 Hyperlipidemia, unspecified: Secondary | ICD-10-CM | POA: Diagnosis not present

## 2018-12-08 DIAGNOSIS — Z0000 Encounter for general adult medical examination without abnormal findings: Secondary | ICD-10-CM | POA: Diagnosis not present

## 2018-12-08 DIAGNOSIS — I70219 Atherosclerosis of native arteries of extremities with intermittent claudication, unspecified extremity: Secondary | ICD-10-CM | POA: Diagnosis not present

## 2018-12-08 DIAGNOSIS — R42 Dizziness and giddiness: Secondary | ICD-10-CM | POA: Diagnosis not present

## 2018-12-08 DIAGNOSIS — R002 Palpitations: Secondary | ICD-10-CM | POA: Diagnosis not present

## 2018-12-08 DIAGNOSIS — H9113 Presbycusis, bilateral: Secondary | ICD-10-CM | POA: Diagnosis not present

## 2018-12-08 DIAGNOSIS — D485 Neoplasm of uncertain behavior of skin: Secondary | ICD-10-CM | POA: Diagnosis not present

## 2018-12-08 DIAGNOSIS — I10 Essential (primary) hypertension: Secondary | ICD-10-CM | POA: Diagnosis not present

## 2019-01-07 ENCOUNTER — Other Ambulatory Visit: Payer: Self-pay

## 2019-01-07 DIAGNOSIS — Z20822 Contact with and (suspected) exposure to covid-19: Secondary | ICD-10-CM

## 2019-01-09 ENCOUNTER — Telehealth: Payer: Self-pay | Admitting: Internal Medicine

## 2019-01-09 LAB — NOVEL CORONAVIRUS, NAA: SARS-CoV-2, NAA: DETECTED — AB

## 2019-01-09 NOTE — Telephone Encounter (Signed)
Pt's husband Effie Shy called to get test results which are positive

## 2019-01-10 ENCOUNTER — Telehealth: Payer: Self-pay | Admitting: Nurse Practitioner

## 2019-01-10 NOTE — Telephone Encounter (Signed)
Called to Discuss with patient about Covid symptoms and the use of bamlanivimab, a monoclonal antibody infusion for those with mild to moderate Covid symptoms and at a high risk of hospitalization.     Pt is qualified for this infusion at the [facility] infusion center due to co-morbid conditions and/or a member of an at-risk group.    Patient is being managed for the following symptoms:  Patient Active Problem List   Diagnosis Date Noted  . Gait disorder 04/13/2014  . Eosinophilia 07/15/2013  . Iron deficiency anemia 08/10/2010  . B12 deficiency 08/10/2010      Unable to reach pt

## 2019-02-14 NOTE — Progress Notes (Signed)
[name]    HEMATOLOGY/ONCOLOGY FOLLOW UP NOTE  Date of Service: 02/15/2019   Patient Care Team: [name], MD as PCP - [place] ([place]  CHIEF COMPLAINTS/PURPOSE OF CONSULTATION:  Follow up for iron deficiency anemia  Diagnosis: 1) Iron deficiency anemia due to chronic GI blood loss requiring intermitent IV iron infusions. Last received this on 03/28/2015 and 04/04/2015. Changed to IV Injectafer on 04/11/16 as needed.  2) B12 deficiency on po B complex replacement.  Treatment Prn IV Iron  INTERVAL HISTORY  I connected with  [name] [name] on 02/15/19 by a telephone and verified that I am speaking with the correct person using two identifiers.   I discussed the limitations of evaluation and management by telemedicine. The patient expressed understanding and agreed to proceed.  Other persons participating in the visit and their role in the encounter:      -Yevette Edwards, Medical Scribe     -Mr. Hardin Negus, pt's husband  Patient's location: Home  Provider's location: [facility] [name] [name] is here for management and evaluation of her iron deficiency anemia. The patient's last visit with [place] was on 08/13/2018. The pt reports that she is doing well overall.  The pt reports that she feels well and has had no concerns in the interim. She has been eating and drinking well. Her energy levels have been stable as well. Pt denies any bloody or black stools. Pt and her husband had COVID19 over Thanksgiving and have recovered well. They are planning on receiving the COVID19 vaccine when they can get an appointment. It has been over a year since pt's last IV iron infusion. Pt has continued to take a B-complex vitamin and Vitamin B12. At this time she is seeing her PCP once per year.   Lab results today (02/15/19) of CBC w/diff and CMP is as follows: all values are WNL except for BUN at 26, Calcium at 8.4. 02/15/2019 Ferritin at 909 02/15/2019 Vitamin B12 at  1054 02/15/2019 Iron and TIBC is as follows: Iron at 78, TIBC at 221, Sat Ratios at 35, UIBC at 143  On review of systems, pt reports eating well and denies bloody/black stools, fatigue and any other symptoms.   MEDICAL HISTORY:  Past Medical History:  Diagnosis Date  . Anemia    iron def  . B12 deficiency 08/10/2010  . Cataract    bilateral  . Esophageal stricture 09/05/2016  . Gait disorder 04/13/2014  . GERD with stricture   . Hypertension   . Iron deficiency anemia 08/10/2010  . Irritable bowel syndrome   . Kidney stones   . Thyroid disease    hypothyroid  Partial gastrectomy (for bleeding stomach ulcer 11 units)  SURGICAL HISTORY: Past Surgical History:  Procedure Laterality Date  . ABDOMINAL HYSTERECTOMY  1975  . APPENDECTOMY  1955  . CHOLECYSTECTOMY  1978  . COLONOSCOPY    . ESOPHAGEAL DILATION  multiple  . VAGOTOMY AND PYLOROPLASTY  1971    SOCIAL HISTORY: Social History   Socioeconomic History  . Marital status: Married    Spouse name: Yailen Gugel  . Number of children: 2  . Years of education: 23  . Highest education level: Not on file  Occupational History  . Occupation: retired  Tobacco Use  . Smoking status: Never Smoker  . Smokeless tobacco: Never Used  Substance and Sexual Activity  . Alcohol use: Never  . Drug use: No  . Sexual activity: Not on file  Other Topics Concern  .  Not on file  Social History Narrative   Patient is right handed.   Patient drinks 1 cup of caffeine per day.   Social Determinants of Health   Financial Resource Strain:   . Difficulty of Paying Living Expenses: Not on file  Food Insecurity:   . Worried About Charity fundraiser in the Last Year: Not on file  . Ran Out of Food in the Last Year: Not on file  Transportation Needs:   . Lack of Transportation (Medical): Not on file  . Lack of Transportation (Non-Medical): Not on file  Physical Activity:   . Days of Exercise per Week: Not on file  . Minutes of Exercise  per Session: Not on file  Stress:   . Feeling of Stress : Not on file  Social Connections:   . Frequency of Communication with Friends and Family: Not on file  . Frequency of Social Gatherings with Friends and Family: Not on file  . Attends Religious Services: Not on file  . Active Member of Clubs or Organizations: Not on file  . Attends Archivist Meetings: Not on file  . Marital Status: Not on file  Intimate Partner Violence:   . Fear of Current or Ex-Partner: Not on file  . Emotionally Abused: Not on file  . Physically Abused: Not on file  . Sexually Abused: Not on file    FAMILY HISTORY: Family History  Problem Relation Age of Onset  . Breast cancer Sister   . Colon cancer Sister        mets from breast  . Hypertension Brother   . Heart disease Father     ALLERGIES:  is allergic to fergon [ferrous gluconate]; ampicillin; cephalexin; codeine; famotidine; nizatidine; tagamet [cimetidine]; and salmon [fish allergy].  MEDICATIONS:  Current Outpatient Medications  Medication Sig Dispense Refill  . amLODipine (NORVASC) 5 MG tablet Take 5 mg by mouth daily.    [name] levothyroxine (SYNTHROID, LEVOTHROID) 200 MCG tablet Take 200 mcg by mouth daily before breakfast.    . MYRBETRIQ 50 MG TB24 tablet Take 50 mg by mouth daily.  11   No current facility-administered medications for this visit.    REVIEW OF SYSTEMS:   A 10+ POINT REVIEW OF SYSTEMS WAS OBTAINED including neurology, dermatology, psychiatry, cardiac, respiratory, lymph, extremities, GI, GU, Musculoskeletal, constitutional, breasts, reproductive, HEENT.  All pertinent positives are noted in the HPI.  All others are negative.   PHYSICAL EXAMINATION: ECOG PERFORMANCE STATUS: 2 - Symptomatic, <50% confined to bed  . There were no vitals filed for this visit. There were no vitals filed for this visit. .There is no height or weight on file to calculate BMI.  Telehealth visit  LABORATORY DATA:  I have reviewed  the data as listed  . CBC Latest Ref Rng & Units 02/15/2019 08/13/2018 02/12/2018  WBC 4.0 - 10.5 K/uL 8.7 5.6 6.9  Hemoglobin 12.0 - 15.0 g/dL 12.7 12.8 12.5  Hematocrit 36.0 - 46.0 % 38.5 38.6 39.7  Platelets 150 - 400 K/uL 222 202 212   . CBC    Component Value Date/Time   WBC 8.7 02/15/2019 1343   RBC 4.13 02/15/2019 1343   HGB 12.7 02/15/2019 1343   HGB 12.6 08/12/2017 1351   HGB 13.0 10/27/2016 1443   HCT 38.5 02/15/2019 1343   HCT 40.4 10/27/2016 1443   PLT 222 02/15/2019 1343   PLT 219 08/12/2017 1351   PLT 305 10/27/2016 1443   MCV 93.2 02/15/2019 1343  MCV 98.1 10/27/2016 1443   MCH 30.8 02/15/2019 1343   MCHC 33.0 02/15/2019 1343   RDW 13.1 02/15/2019 1343   RDW 12.6 10/27/2016 1443   LYMPHSABS 1.7 02/15/2019 1343   LYMPHSABS 1.9 10/27/2016 1443   MONOABS 0.6 02/15/2019 1343   MONOABS 0.6 10/27/2016 1443   EOSABS 0.4 02/15/2019 1343   EOSABS 1.2 (H) 10/27/2016 1443   BASOSABS 0.0 02/15/2019 1343   BASOSABS 0.1 10/27/2016 1443    . CMP Latest Ref Rng & Units 02/15/2019 08/13/2018 02/12/2018  Glucose 70 - 99 mg/dL 96 105(H) 149(H)  BUN 8 - 23 mg/dL 26(H) 17 18  Creatinine 0.44 - 1.00 mg/dL 0.76 0.78 0.77  Sodium 135 - 145 mmol/L 143 141 140  Potassium 3.5 - 5.1 mmol/L 4.2 4.4 4.1  Chloride 98 - 111 mmol/L 111 108 107  CO2 22 - 32 mmol/L 24 26 23   Calcium 8.9 - 10.3 mg/dL 8.4(L) 9.1 8.8(L)  Total Protein 6.5 - 8.1 g/dL 6.7 7.2 6.6  Total Bilirubin 0.3 - 1.2 mg/dL 0.8 0.9 0.9  Alkaline Phos 38 - 126 U/L 85 70 62  AST 15 - 41 U/L 20 18 16   ALT 0 - 44 U/L 16 13 13    . Lab Results  Component Value Date   IRON 78 02/15/2019   TIBC 221 (L) 02/15/2019   IRONPCTSAT 35 02/15/2019   (Iron and TIBC)  Lab Results  Component Value Date   FERRITIN 909 (H) 02/15/2019   B12 - 503 RADIOGRAPHIC STUDIES: I have personally reviewed the radiological images as listed and agreed with the findings in the report. No results found.  ASSESSMENT & PLAN:   86 y.o.  caucasian female with   1) Chronic iron deficiency anemia due to chronic GI blood loss and likely issues with absorption related to partial gastrectomy. Also patient has had intolerance to po iron.  2) h/o B12 deficiency - lower level at 226 Recheck B12 level on next visit.  Plan:  -Discussed pt labwork today, 02/15/19; blood counts are nml, blood chemistries are stable -Discussed 02/15/2019 Ferritin at 909 -Discussed 02/15/2019 Vitamin B12 at 1054 -Discussed 02/15/2019 Iron and TIBC is as follows: Iron at 78, TIBC at 221, Sat Ratios at 35, UIBC at 143 -Goal for Ferritin to be >100 - currently at goal -No indication for IV or PO iron at this time -Pt no longer anemic or B12 deficient -Continue B complex and add PO B12 1000 mcg SL daily -Will continue to monitor iron levels -Recommended pt receive COVID19 vaccine when available  -Will get labs in 6 months  -Will see back in 12 months with labs    FOLLOW UP: Labs in 6 months  RTC with Dr Irene Limbo with labs in 12 months   The total time spent in the appt was 15 minutes and more than 50% was on counseling and direct patient cares.  All of the patient's questions were answered with apparent satisfaction. The patient knows to call the clinic with any problems, questions or concerns.    Sullivan Lone MD [place] AAHIVMS [facility] Hematology/Oncology Physician [facility]  (Office):       [phone] (Work cell):  [phone] (Fax):           [phone]  I, Yevette Edwards, am acting as a scribe for Dr. Sullivan Lone.   .I have reviewed the above documentation for accuracy and completeness, and I agree with the above. Brunetta Genera MD

## 2019-02-15 ENCOUNTER — Inpatient Hospital Stay (HOSPITAL_BASED_OUTPATIENT_CLINIC_OR_DEPARTMENT_OTHER): Payer: PPO | Admitting: Hematology

## 2019-02-15 ENCOUNTER — Inpatient Hospital Stay: Payer: PPO | Attending: Hematology

## 2019-02-15 ENCOUNTER — Other Ambulatory Visit: Payer: Self-pay

## 2019-02-15 DIAGNOSIS — Z7982 Long term (current) use of aspirin: Secondary | ICD-10-CM | POA: Insufficient documentation

## 2019-02-15 DIAGNOSIS — K589 Irritable bowel syndrome without diarrhea: Secondary | ICD-10-CM | POA: Diagnosis not present

## 2019-02-15 DIAGNOSIS — K219 Gastro-esophageal reflux disease without esophagitis: Secondary | ICD-10-CM | POA: Insufficient documentation

## 2019-02-15 DIAGNOSIS — E538 Deficiency of other specified B group vitamins: Secondary | ICD-10-CM

## 2019-02-15 DIAGNOSIS — D509 Iron deficiency anemia, unspecified: Secondary | ICD-10-CM | POA: Diagnosis not present

## 2019-02-15 DIAGNOSIS — D500 Iron deficiency anemia secondary to blood loss (chronic): Secondary | ICD-10-CM

## 2019-02-15 DIAGNOSIS — E039 Hypothyroidism, unspecified: Secondary | ICD-10-CM | POA: Diagnosis not present

## 2019-02-15 DIAGNOSIS — Z79899 Other long term (current) drug therapy: Secondary | ICD-10-CM | POA: Diagnosis not present

## 2019-02-15 LAB — CBC WITH DIFFERENTIAL/PLATELET
Abs Immature Granulocytes: 0.02 10*3/uL (ref 0.00–0.07)
Basophils Absolute: 0 10*3/uL (ref 0.0–0.1)
Basophils Relative: 1 %
Eosinophils Absolute: 0.4 10*3/uL (ref 0.0–0.5)
Eosinophils Relative: 4 %
HCT: 38.5 % (ref 36.0–46.0)
Hemoglobin: 12.7 g/dL (ref 12.0–15.0)
Immature Granulocytes: 0 %
Lymphocytes Relative: 19 %
Lymphs Abs: 1.7 10*3/uL (ref 0.7–4.0)
MCH: 30.8 pg (ref 26.0–34.0)
MCHC: 33 g/dL (ref 30.0–36.0)
MCV: 93.2 fL (ref 80.0–100.0)
Monocytes Absolute: 0.6 10*3/uL (ref 0.1–1.0)
Monocytes Relative: 7 %
Neutro Abs: 6 10*3/uL (ref 1.7–7.7)
Neutrophils Relative %: 69 %
Platelets: 222 10*3/uL (ref 150–400)
RBC: 4.13 MIL/uL (ref 3.87–5.11)
RDW: 13.1 % (ref 11.5–15.5)
WBC: 8.7 10*3/uL (ref 4.0–10.5)
nRBC: 0 % (ref 0.0–0.2)

## 2019-02-15 LAB — IRON AND TIBC
Iron: 78 ug/dL (ref 41–142)
Saturation Ratios: 35 % (ref 21–57)
TIBC: 221 ug/dL — ABNORMAL LOW (ref 236–444)
UIBC: 143 ug/dL (ref 120–384)

## 2019-02-15 LAB — VITAMIN B12: Vitamin B-12: 1054 pg/mL — ABNORMAL HIGH (ref 180–914)

## 2019-02-15 LAB — CMP (CANCER CENTER ONLY)
ALT: 16 U/L (ref 0–44)
AST: 20 U/L (ref 15–41)
Albumin: 3.8 g/dL (ref 3.5–5.0)
Alkaline Phosphatase: 85 U/L (ref 38–126)
Anion gap: 8 (ref 5–15)
BUN: 26 mg/dL — ABNORMAL HIGH (ref 8–23)
CO2: 24 mmol/L (ref 22–32)
Calcium: 8.4 mg/dL — ABNORMAL LOW (ref 8.9–10.3)
Chloride: 111 mmol/L (ref 98–111)
Creatinine: 0.76 mg/dL (ref 0.44–1.00)
GFR, Est AFR Am: >60 mL/min (ref >60)
GFR, Estimated: >60 mL/min (ref >60)
Glucose, Bld: 96 mg/dL (ref 70–99)
Potassium: 4.2 mmol/L (ref 3.5–5.1)
Sodium: 143 mmol/L (ref 135–145)
Total Bilirubin: 0.8 mg/dL (ref 0.3–1.2)
Total Protein: 6.7 g/dL (ref 6.5–8.1)

## 2019-02-15 LAB — FERRITIN: Ferritin: 909 ng/mL — ABNORMAL HIGH (ref 11–307)

## 2019-02-16 ENCOUNTER — Telehealth: Payer: Self-pay | Admitting: Hematology

## 2019-02-16 NOTE — Telephone Encounter (Signed)
Scheduled appt per 1/12 los.  Sent a message to HIM pool to get a calendar mailed out. 

## 2019-07-19 DIAGNOSIS — L570 Actinic keratosis: Secondary | ICD-10-CM | POA: Diagnosis not present

## 2019-07-19 DIAGNOSIS — G20 Parkinson's disease: Secondary | ICD-10-CM | POA: Diagnosis not present

## 2019-07-19 DIAGNOSIS — I70219 Atherosclerosis of native arteries of extremities with intermittent claudication, unspecified extremity: Secondary | ICD-10-CM | POA: Diagnosis not present

## 2019-07-19 DIAGNOSIS — D485 Neoplasm of uncertain behavior of skin: Secondary | ICD-10-CM | POA: Diagnosis not present

## 2019-07-22 DIAGNOSIS — I10 Essential (primary) hypertension: Secondary | ICD-10-CM | POA: Diagnosis not present

## 2019-08-15 ENCOUNTER — Inpatient Hospital Stay: Payer: PPO | Attending: Hematology

## 2019-08-15 ENCOUNTER — Other Ambulatory Visit: Payer: Self-pay

## 2019-08-15 DIAGNOSIS — D500 Iron deficiency anemia secondary to blood loss (chronic): Secondary | ICD-10-CM | POA: Diagnosis not present

## 2019-08-15 DIAGNOSIS — E538 Deficiency of other specified B group vitamins: Secondary | ICD-10-CM

## 2019-08-15 LAB — CBC WITH DIFFERENTIAL/PLATELET
Abs Immature Granulocytes: 0.01 10*3/uL (ref 0.00–0.07)
Basophils Absolute: 0.1 10*3/uL (ref 0.0–0.1)
Basophils Relative: 1 %
Eosinophils Absolute: 0.3 10*3/uL (ref 0.0–0.5)
Eosinophils Relative: 5 %
HCT: 39.4 % (ref 36.0–46.0)
Hemoglobin: 13.2 g/dL (ref 12.0–15.0)
Immature Granulocytes: 0 %
Lymphocytes Relative: 27 %
Lymphs Abs: 1.8 10*3/uL (ref 0.7–4.0)
MCH: 31.8 pg (ref 26.0–34.0)
MCHC: 33.5 g/dL (ref 30.0–36.0)
MCV: 94.9 fL (ref 80.0–100.0)
Monocytes Absolute: 0.6 10*3/uL (ref 0.1–1.0)
Monocytes Relative: 9 %
Neutro Abs: 4 10*3/uL (ref 1.7–7.7)
Neutrophils Relative %: 58 %
Platelets: 188 10*3/uL (ref 150–400)
RBC: 4.15 MIL/uL (ref 3.87–5.11)
RDW: 12.5 % (ref 11.5–15.5)
WBC: 6.7 10*3/uL (ref 4.0–10.5)
nRBC: 0 % (ref 0.0–0.2)

## 2019-08-15 LAB — VITAMIN B12: Vitamin B-12: 1165 pg/mL — ABNORMAL HIGH (ref 180–914)

## 2019-08-15 LAB — FERRITIN: Ferritin: 662 ng/mL — ABNORMAL HIGH (ref 11–307)

## 2019-08-16 DIAGNOSIS — J209 Acute bronchitis, unspecified: Secondary | ICD-10-CM | POA: Diagnosis not present

## 2019-08-16 DIAGNOSIS — R1032 Left lower quadrant pain: Secondary | ICD-10-CM | POA: Diagnosis not present

## 2019-10-18 DIAGNOSIS — Z23 Encounter for immunization: Secondary | ICD-10-CM | POA: Diagnosis not present

## 2019-10-18 DIAGNOSIS — R1032 Left lower quadrant pain: Secondary | ICD-10-CM | POA: Diagnosis not present

## 2019-11-22 DIAGNOSIS — R1032 Left lower quadrant pain: Secondary | ICD-10-CM | POA: Diagnosis not present

## 2020-02-15 ENCOUNTER — Inpatient Hospital Stay: Payer: Medicare HMO | Attending: Hematology | Admitting: Hematology

## 2020-02-15 ENCOUNTER — Inpatient Hospital Stay: Payer: Medicare HMO

## 2020-02-15 ENCOUNTER — Other Ambulatory Visit: Payer: Self-pay

## 2020-02-15 VITALS — BP 137/68 | HR 66 | Temp 97.6°F | Resp 17 | Wt 108.7 lb

## 2020-02-15 DIAGNOSIS — E039 Hypothyroidism, unspecified: Secondary | ICD-10-CM | POA: Insufficient documentation

## 2020-02-15 DIAGNOSIS — K589 Irritable bowel syndrome without diarrhea: Secondary | ICD-10-CM | POA: Insufficient documentation

## 2020-02-15 DIAGNOSIS — K219 Gastro-esophageal reflux disease without esophagitis: Secondary | ICD-10-CM | POA: Insufficient documentation

## 2020-02-15 DIAGNOSIS — D500 Iron deficiency anemia secondary to blood loss (chronic): Secondary | ICD-10-CM | POA: Diagnosis not present

## 2020-02-15 DIAGNOSIS — Z87442 Personal history of urinary calculi: Secondary | ICD-10-CM | POA: Diagnosis not present

## 2020-02-15 DIAGNOSIS — I10 Essential (primary) hypertension: Secondary | ICD-10-CM | POA: Diagnosis not present

## 2020-02-15 DIAGNOSIS — Z79899 Other long term (current) drug therapy: Secondary | ICD-10-CM | POA: Insufficient documentation

## 2020-02-15 DIAGNOSIS — Z803 Family history of malignant neoplasm of breast: Secondary | ICD-10-CM | POA: Diagnosis not present

## 2020-02-15 DIAGNOSIS — E538 Deficiency of other specified B group vitamins: Secondary | ICD-10-CM | POA: Insufficient documentation

## 2020-02-15 DIAGNOSIS — Z800 Family history of malignant neoplasm of digestive organs: Secondary | ICD-10-CM | POA: Diagnosis not present

## 2020-02-15 LAB — CBC WITH DIFFERENTIAL/PLATELET
Abs Immature Granulocytes: 0.02 10*3/uL (ref 0.00–0.07)
Basophils Absolute: 0.1 10*3/uL (ref 0.0–0.1)
Basophils Relative: 1 %
Eosinophils Absolute: 0.5 10*3/uL (ref 0.0–0.5)
Eosinophils Relative: 7 %
HCT: 39.8 % (ref 36.0–46.0)
Hemoglobin: 13.3 g/dL (ref 12.0–15.0)
Immature Granulocytes: 0 %
Lymphocytes Relative: 26 %
Lymphs Abs: 1.7 10*3/uL (ref 0.7–4.0)
MCH: 31.8 pg (ref 26.0–34.0)
MCHC: 33.4 g/dL (ref 30.0–36.0)
MCV: 95.2 fL (ref 80.0–100.0)
Monocytes Absolute: 0.5 10*3/uL (ref 0.1–1.0)
Monocytes Relative: 7 %
Neutro Abs: 4 10*3/uL (ref 1.7–7.7)
Neutrophils Relative %: 59 %
Platelets: 193 10*3/uL (ref 150–400)
RBC: 4.18 MIL/uL (ref 3.87–5.11)
RDW: 12.9 % (ref 11.5–15.5)
WBC: 6.7 10*3/uL (ref 4.0–10.5)
nRBC: 0 % (ref 0.0–0.2)

## 2020-02-15 LAB — VITAMIN B12: Vitamin B-12: 1008 pg/mL — ABNORMAL HIGH (ref 180–914)

## 2020-02-15 LAB — FERRITIN: Ferritin: 456 ng/mL — ABNORMAL HIGH (ref 11–307)

## 2020-02-15 NOTE — Progress Notes (Signed)
[name] [name]    HEMATOLOGY/ONCOLOGY FOLLOW UP NOTE  Date of Service: 02/15/2020   Patient Care Team: [name], [place] as PCP - [place] (Internal Medicine)in [place]  CHIEF COMPLAINTS/PURPOSE OF CONSULTATION:  Follow up for iron deficiency anemia  Diagnosis: 1) Iron deficiency anemia due to chronic GI blood loss requiring intermitent IV iron infusions. Last received this on 03/28/2015 and 04/04/2015. Changed to IV Injectafer on 04/11/16 as needed.  2) B12 deficiency on po B complex replacement.  Treatment Prn IV Iron  INTERVAL HISTORY:  Ms Bandstra is here for management and evaluation of her iron deficiency anemia. The patient's last visit with [place] was on 02/15/2019. The pt reports that she is doing well overall.  The pt reports no acute new symptoms. No issues with GI bleeding.  Lab results today (02/15/20) of CBC w/diff and CMP reviewed with patient 02/15/2020 Ferritin at 456 02/15/2020 Vitamin B12 at 1008  MEDICAL HISTORY:  Past Medical History:  Diagnosis Date  . Anemia    iron def  . B12 deficiency 08/10/2010  . Cataract    bilateral  . Esophageal stricture 09/05/2016  . Gait disorder 04/13/2014  . GERD with stricture   . Hypertension   . Iron deficiency anemia 08/10/2010  . Irritable bowel syndrome   . Kidney stones   . Thyroid disease    hypothyroid  Partial gastrectomy (for bleeding stomach ulcer 11 units)  SURGICAL HISTORY: Past Surgical History:  Procedure Laterality Date  . ABDOMINAL HYSTERECTOMY  1975  . APPENDECTOMY  1955  . CHOLECYSTECTOMY  1978  . COLONOSCOPY    . ESOPHAGEAL DILATION  multiple  . VAGOTOMY AND PYLOROPLASTY  1971    SOCIAL HISTORY: Social History   Socioeconomic History  . Marital status: Married    Spouse name: Sanii Gagen  . Number of children: 2  . Years of education: 38  . Highest education level: Not on file  Occupational History  . Occupation: retired  Tobacco Use  . Smoking status: Never Smoker  . Smokeless tobacco: Never Used   Vaping Use  . Vaping Use: Never used  Substance and Sexual Activity  . Alcohol use: Never  . Drug use: No  . Sexual activity: Not on file  Other Topics Concern  . Not on file  Social History Narrative   Patient is right handed.   Patient drinks 1 cup of caffeine per day.   Social Determinants of Health   Financial Resource Strain: Not on file  Food Insecurity: Not on file  Transportation Needs: Not on file  Physical Activity: Not on file  Stress: Not on file  Social Connections: Not on file  Intimate Partner Violence: Not on file    FAMILY HISTORY: Family History  Problem Relation Age of Onset  . Breast cancer Sister   . Colon cancer Sister        mets from breast  . Hypertension Brother   . Heart disease Father     ALLERGIES:  is allergic to fergon [ferrous gluconate], ampicillin, cephalexin, codeine, famotidine, nizatidine, tagamet [cimetidine], and salmon [fish allergy].  MEDICATIONS:  Current Outpatient Medications  Medication Sig Dispense Refill  . amLODipine (NORVASC) 5 MG tablet Take 5 mg by mouth daily.    [name] [name] levothyroxine (SYNTHROID, LEVOTHROID) 200 MCG tablet Take 200 mcg by mouth daily before breakfast.    . MYRBETRIQ 50 MG TB24 tablet Take 50 mg by mouth daily.  11   No current facility-administered medications for this visit.    REVIEW OF  SYSTEMS:   A 10+ POINT REVIEW OF SYSTEMS WAS OBTAINED including neurology, dermatology, psychiatry, cardiac, respiratory, lymph, extremities, GI, GU, Musculoskeletal, constitutional, breasts, reproductive, HEENT.  All pertinent positives are noted in the HPI.  All others are negative.   PHYSICAL EXAMINATION: ECOG PERFORMANCE STATUS: 2 - Symptomatic, <50% confined to bed  . Vitals:   02/15/20 1411  BP: 137/68  Pulse: 66  Resp: 17  Temp: 97.6 F (36.4 C)  SpO2: 97%   Filed Weights   02/15/20 1411  Weight: 108 lb 11.2 oz (49.3 kg)   .Body mass index is 18.09 kg/m.  NAD GENERAL:alert, in no acute  distress and comfortable SKIN: no acute rashes, no significant lesions EYES: conjunctiva are pink and non-injected, sclera anicteric OROPHARYNX: MMM, no exudates, no oropharyngeal erythema or ulceration NECK: supple, no JVD LYMPH:  no palpable lymphadenopathy in the cervical, axillary or inguinal regions LUNGS: clear to auscultation b/l with normal respiratory effort HEART: regular rate & rhythm ABDOMEN:  normoactive bowel sounds , non tender, not distended. No palpable hepatosplenomegaly.  Extremity: no pedal edema PSYCH: alert & oriented x 3 with fluent speech NEURO: no focal motor/sensory deficits  LABORATORY DATA:  I have reviewed the data as listed  . CBC Latest Ref Rng & Units 02/15/2020 08/15/2019 02/15/2019  WBC 4.0 - 10.5 K/uL 6.7 6.7 8.7  Hemoglobin 12.0 - 15.0 g/dL 13.3 13.2 12.7  Hematocrit 36.0 - 46.0 % 39.8 39.4 38.5  Platelets 150 - 400 K/uL 193 188 222   . CBC    Component Value Date/Time   WBC 6.7 02/15/2020 1324   RBC 4.18 02/15/2020 1324   HGB 13.3 02/15/2020 1324   HGB 12.6 08/12/2017 1351   HGB 13.0 10/27/2016 1443   HCT 39.8 02/15/2020 1324   HCT 40.4 10/27/2016 1443   PLT 193 02/15/2020 1324   PLT 219 08/12/2017 1351   PLT 305 10/27/2016 1443   MCV 95.2 02/15/2020 1324   MCV 98.1 10/27/2016 1443   MCH 31.8 02/15/2020 1324   MCHC 33.4 02/15/2020 1324   RDW 12.9 02/15/2020 1324   RDW 12.6 10/27/2016 1443   LYMPHSABS 1.7 02/15/2020 1324   LYMPHSABS 1.9 10/27/2016 1443   MONOABS 0.5 02/15/2020 1324   MONOABS 0.6 10/27/2016 1443   EOSABS 0.5 02/15/2020 1324   EOSABS 1.2 (H) 10/27/2016 1443   BASOSABS 0.1 02/15/2020 1324   BASOSABS 0.1 10/27/2016 1443    . CMP Latest Ref Rng & Units 02/15/2019 08/13/2018 02/12/2018  Glucose 70 - 99 mg/dL 96 105(H) 149(H)  BUN 8 - 23 mg/dL 26(H) 17 18  Creatinine 0.44 - 1.00 mg/dL 0.76 0.78 0.77  Sodium 135 - 145 mmol/L 143 141 140  Potassium 3.5 - 5.1 mmol/L 4.2 4.4 4.1  Chloride 98 - 111 mmol/L 111 108 107  CO2  22 - 32 mmol/L 24 26 23   Calcium 8.9 - 10.3 mg/dL 8.4(L) 9.1 8.8(L)  Total Protein 6.5 - 8.1 g/dL 6.7 7.2 6.6  Total Bilirubin 0.3 - 1.2 mg/dL 0.8 0.9 0.9  Alkaline Phos 38 - 126 U/L 85 70 62  AST 15 - 41 U/L 20 18 16   ALT 0 - 44 U/L 16 13 13    . Lab Results  Component Value Date   IRON 78 02/15/2019   TIBC 221 (L) 02/15/2019   IRONPCTSAT 35 02/15/2019   (Iron and TIBC)  Lab Results  Component Value Date   FERRITIN 456 (H) 02/15/2020   B12 - 503 RADIOGRAPHIC STUDIES: I have personally reviewed  the radiological images as listed and agreed with the findings in the report. No results found.  ASSESSMENT & PLAN:   87 y.o. caucasian female with   1) Chronic iron deficiency anemia due to chronic GI blood loss and likely issues with absorption related to partial gastrectomy. Also patient has had intolerance to po iron.  2) h/o B12 deficiency - B12 @ 1008  Plan:  -labs are stable -Goal for Ferritin to be >100 -- ferirtin at goals and no indication for additional IV iron at this time. -Continue B complex and PO B12 1000 mcg SL daily -continue f/u with PCP-- re-consult [place] as needed.  FOLLOW UP: RTC with Dr Irene Limbo as needed   The total time spent in the appt was 15 minutes and more than 50% was on counseling and direct patient cares.  All of the patient's questions were answered with apparent satisfaction. The patient knows to call the clinic with any problems, questions or concerns.    Sullivan Lone MD [place] AAHIVMS [facility] Hematology/Oncology Physician [facility]  (Office):       [phone] (Work cell):  [phone] (Fax):           [phone]  I, Yevette Edwards, am acting as a scribe for Dr. Sullivan Lone.   .I have reviewed the above documentation for accuracy and completeness, and I agree with the above. Brunetta Genera MD

## 2020-03-08 ENCOUNTER — Ambulatory Visit: Payer: Medicare HMO | Admitting: Neurology

## 2020-03-08 ENCOUNTER — Encounter: Payer: Self-pay | Admitting: Neurology

## 2020-03-08 ENCOUNTER — Other Ambulatory Visit: Payer: Self-pay

## 2020-03-08 VITALS — BP 150/78 | HR 70 | Ht 63.0 in | Wt 109.0 lb

## 2020-03-08 DIAGNOSIS — G231 Progressive supranuclear ophthalmoplegia [Steele-Richardson-Olszewski]: Secondary | ICD-10-CM | POA: Diagnosis not present

## 2020-03-08 NOTE — Progress Notes (Signed)
Assessment/Plan:   1.  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  We talked about nature, etiology and pathophysiology. We talked about how the symptoms, course and prognosis differ from Parkinson's Disease.  We talked about the risks, particularly for falls and aspiration.   -Patient/husband does not want any medication.  -Discussed donepezil and quetiapine.  She has some behavioral change in the early evening, but then she does well the rest of the time.  Discussed starting with donepezil.  Ultimately, her husband states that he is managing well and does not want to start any medication.  -Patient has some home health caregivers and does not feel that he needs more than that.  [facility].  They currently live at [facility] and he thinks that they have support there.  -Information given on cure PSP.  Information given to our caregiver and patient support group for atypical parkinsonian states.  -Patient/husband do not want to schedule regular follow-up visit.  They will let me know if they need me in the future.    Subjective:   [name] [name] was seen today in follow up for gait and memory trouble.  My previous records as well as any outside records available were reviewed prior to todays visit. Patient was last seen over 2-1/2 years ago. We ordered a DaTscan, but ultimately they declined to schedule that and declined a follow-up appointment. She is with her husband today who supplements the history. She is seen hematology for iron deficiency anemia. Those notes are reviewed.  Husband states that pt is overall getting worse.  She is having trouble walking.  She is dragging her feet with ambulation.   Husband states that she has trouble seeing - he will ask her to send her the remote and she won't be able to give it to  him.  She has word finding trouble.  She still has bladder and some fecal incontinence (hx of gastric sx in the past).  Pt has trouble with using utensils.  She is able to swallow food without much trouble.  Husband states that she doesn't recognize difference between knife, fork and spoon.  Husband states that "the witches attack around 3 pm.  Some people call that sundowners."  She will take off her clothes.  Her husband states that he will then give her 2 lomotil but "it doesn't help much."  She is on sertraline x 3-4 months without help.  Husband has caregiver 2 hours in the AM, 7 days per week.     PREVIOUS MEDICATIONS: none to date  CURRENT MEDICATIONS:  Outpatient Encounter Medications as of 03/08/2020  Medication Sig  . diphenoxylate-atropine (LOMOTIL) 2.5-0.025 MG tablet Take 1 tablet by mouth 2 (two) times daily.  [name] levothyroxine (SYNTHROID, LEVOTHROID) 200 MCG tablet Take 200 mcg by mouth daily before breakfast.  . sertraline (ZOLOFT) 25 MG tablet Take 25 mg by mouth daily.  . [DISCONTINUED] amLODipine (NORVASC) 5 MG tablet Take 5 mg by mouth daily.  . [DISCONTINUED] MYRBETRIQ 50 MG TB24 tablet Take 50 mg by mouth daily.   No facility-administered encounter medications on file as of 03/08/2020.     Objective:   PHYSICAL EXAMINATION:    VITALS:   Vitals:   03/08/20 1109  BP: (!) 150/78  Pulse: 70  SpO2: 98%  Weight: 109 lb (49.4 kg)  Height: 5\' 3"  (  1.6 m)    GEN:  The patient appears stated age and is in NAD. HEENT:  Normocephalic, atraumatic.  The mucous membranes are moist. The superficial temporal arteries are without ropiness or tenderness. CV:  RRR Lungs:  CTAB Neck/HEME:  There are no carotid bruits bilaterally.  Neurological examination:  Orientation: The patient is alert and oriented to person.  She lets her husband do most of the giving of the history. Cranial nerves: There is good facial symmetry. There is significant facial hypomimia.   Patient with upgaze  paresis.  Patient with mild downgaze paresis.  The visual fields are full to confrontational testing. The speech has loss of fluency, but when she is shown objects she is able to appropriately name them. Soft palate rises symmetrically and there is no tongue deviation. Hearing is intact to conversational tone. Sensation: Sensation is intact to light touch throughout. Motor: Strength is 5/5 in the bilateral upper and lower extremities.   Shoulder shrug is equal and symmetric.  There is no pronator drift. Deep tendon reflexes: Deep tendon reflexes are 3/4 at the bilateral biceps, triceps, brachioradialis, patella and achilles. Plantar responses are downgoing bilaterally.  Movement examination: Tone: There is normal tone in the bilateral upper extremities.  The tone in the lower extremities is normal.  Abnormal movements: no rest tremor.  Mild postural tremor Coordination:  There is slowness of all rapid alternating movements. Gait and Station: The patient requires assistance out of the chair, but only mildly so.  She is short stepped.  She is given a walker.  She drags the right leg.  Without the walker, she is even more short stepped and unsteady.     Total time spent on today's visit was 50 minutes, including both face-to-face time and nonface-to-face time.  Time included that spent on review of records (prior notes available to me/labs/imaging if pertinent), discussing treatment and goals, answering patient's questions and coordinating care.  Cc:  [name], MD

## 2020-04-13 ENCOUNTER — Ambulatory Visit: Payer: PPO | Admitting: Neurology

## 2020-07-16 ENCOUNTER — Ambulatory Visit (INDEPENDENT_AMBULATORY_CARE_PROVIDER_SITE_OTHER): Payer: Medicare HMO | Admitting: Otolaryngology

## 2020-08-08 ENCOUNTER — Telehealth: Payer: Self-pay | Admitting: Neurology

## 2020-08-08 NOTE — Telephone Encounter (Signed)
Apolonio Schneiders called in from hospice and would like orders sent in for hospice. Best number is [phone]

## 2020-08-08 NOTE — Telephone Encounter (Signed)
There is no documentation in Epic that a referral was made to hospice by Dr. Carles Collet.  Can we find out who [name] [name] is and how she is involved in this patient's care?  Did she intend to contact patient's PCP?

## 2020-08-08 NOTE — Telephone Encounter (Signed)
Called [name] [name] and she informed me that patients husband called them directly to ask for Hospice care. I asked [name] [name] if they contacted patients PCP and she stated no and that patients husband felt it was appropriate for Dr. Carles Collet to do the referral since patient has a Neurological disease.   I informed [name] [name] that Dr. Carles Collet is out of office on vacation and that I would send message to hear back from her covering physician.

## 2020-08-09 NOTE — Telephone Encounter (Signed)
Called rachel and left a message for a call back.

## 2020-08-09 NOTE — Telephone Encounter (Signed)
Manus Gunning from Hospice called and I informed her per Dr. Posey Pronto that "Reviewing Dr. Doristine Devoid notes, she was not managing any medications and looks like it was only a one-time visit.  Please recommend them to go through their PCP's office, as they would be able to better guide through symptom management."  Manus Gunning stated she will contact the patients PCP. No further questions or concerns.

## 2020-08-09 NOTE — Telephone Encounter (Signed)
Reviewing Dr. Doristine Devoid notes, she was not managing any medications and looks like it was only a one-time visit.  Please recommend them to go through their PCP's office, as they would be able to better guide through symptom management.

## 2020-10-04 DEATH — deceased
